# Patient Record
Sex: Female | Born: 1955 | Race: Black or African American | Hispanic: No | Marital: Married | Smoking: Never smoker
Health system: Southern US, Community
[De-identification: ages and names within clinical notes are randomized; demographics above are authoritative.]

## PROBLEM LIST (undated history)

## (undated) DIAGNOSIS — E785 Hyperlipidemia, unspecified: Secondary | ICD-10-CM

## (undated) DIAGNOSIS — E66811 Obesity, class 1: Secondary | ICD-10-CM

## (undated) DIAGNOSIS — G8929 Other chronic pain: Secondary | ICD-10-CM

## (undated) DIAGNOSIS — H8110 Benign paroxysmal vertigo, unspecified ear: Secondary | ICD-10-CM

## (undated) DIAGNOSIS — Z79899 Other long term (current) drug therapy: Secondary | ICD-10-CM

## (undated) DIAGNOSIS — E669 Obesity, unspecified: Secondary | ICD-10-CM

## (undated) DIAGNOSIS — L659 Nonscarring hair loss, unspecified: Secondary | ICD-10-CM

## (undated) DIAGNOSIS — M5416 Radiculopathy, lumbar region: Secondary | ICD-10-CM

## (undated) DIAGNOSIS — I8390 Asymptomatic varicose veins of unspecified lower extremity: Secondary | ICD-10-CM

## (undated) DIAGNOSIS — I10 Essential (primary) hypertension: Secondary | ICD-10-CM

## (undated) HISTORY — DX: Other chronic pain: G89.29

## (undated) HISTORY — DX: Nonscarring hair loss, unspecified: L65.9

## (undated) HISTORY — DX: Asymptomatic varicose veins of unspecified lower extremity: I83.90

## (undated) HISTORY — DX: Other long term (current) drug therapy: Z79.899

## (undated) HISTORY — DX: Obesity, unspecified: E66.9

## (undated) HISTORY — DX: Benign paroxysmal vertigo, unspecified ear: H81.10

## (undated) HISTORY — DX: Obesity, class 1: E66.811

## (undated) HISTORY — PX: BREAST EXCISIONAL BIOPSY: SUR124

## (undated) HISTORY — DX: Essential (primary) hypertension: I10

## (undated) HISTORY — PX: ABDOMINAL HYSTERECTOMY: SHX81

## (undated) HISTORY — DX: Hyperlipidemia, unspecified: E78.5

## (undated) HISTORY — PX: TONSILLECTOMY AND ADENOIDECTOMY: SUR1326

## (undated) HISTORY — PX: BREAST BIOPSY: SHX20

## (undated) HISTORY — DX: Radiculopathy, lumbar region: M54.16

---

## 2007-05-25 DIAGNOSIS — I10 Essential (primary) hypertension: Secondary | ICD-10-CM | POA: Insufficient documentation

## 2008-12-01 ENCOUNTER — Ambulatory Visit: Payer: Self-pay | Admitting: Family Medicine

## 2008-12-01 DIAGNOSIS — R600 Localized edema: Secondary | ICD-10-CM | POA: Insufficient documentation

## 2009-08-11 DIAGNOSIS — H8110 Benign paroxysmal vertigo, unspecified ear: Secondary | ICD-10-CM | POA: Insufficient documentation

## 2011-02-04 ENCOUNTER — Ambulatory Visit: Payer: Self-pay | Admitting: Family Medicine

## 2011-02-14 ENCOUNTER — Ambulatory Visit: Payer: Self-pay | Admitting: Family Medicine

## 2012-06-15 IMAGING — MG MAM DGTL SCREENING MAMMO W/CAD
1 series · 5 of 5 positions shown · non-contrast
Comparison: none

REASON FOR EXAM: SCR
COMMENTS:

[R CC · right · 5 of 5 slices shown]
[im 1/5]
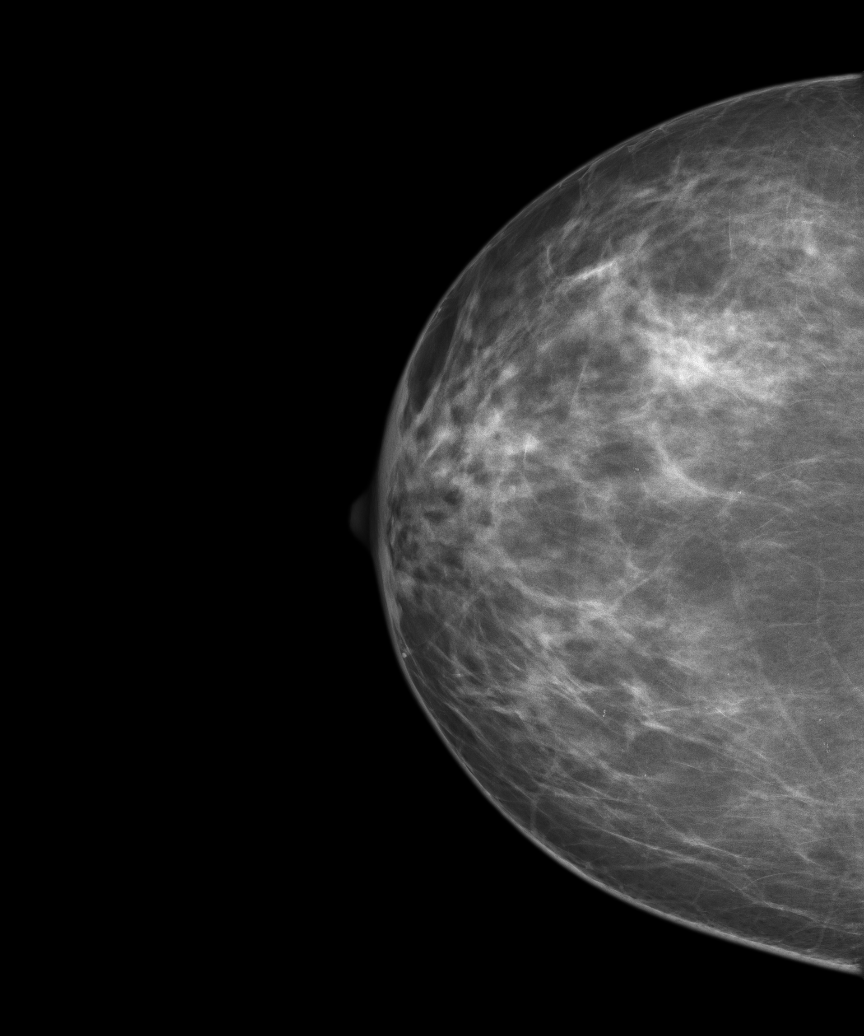
[im 2/5]
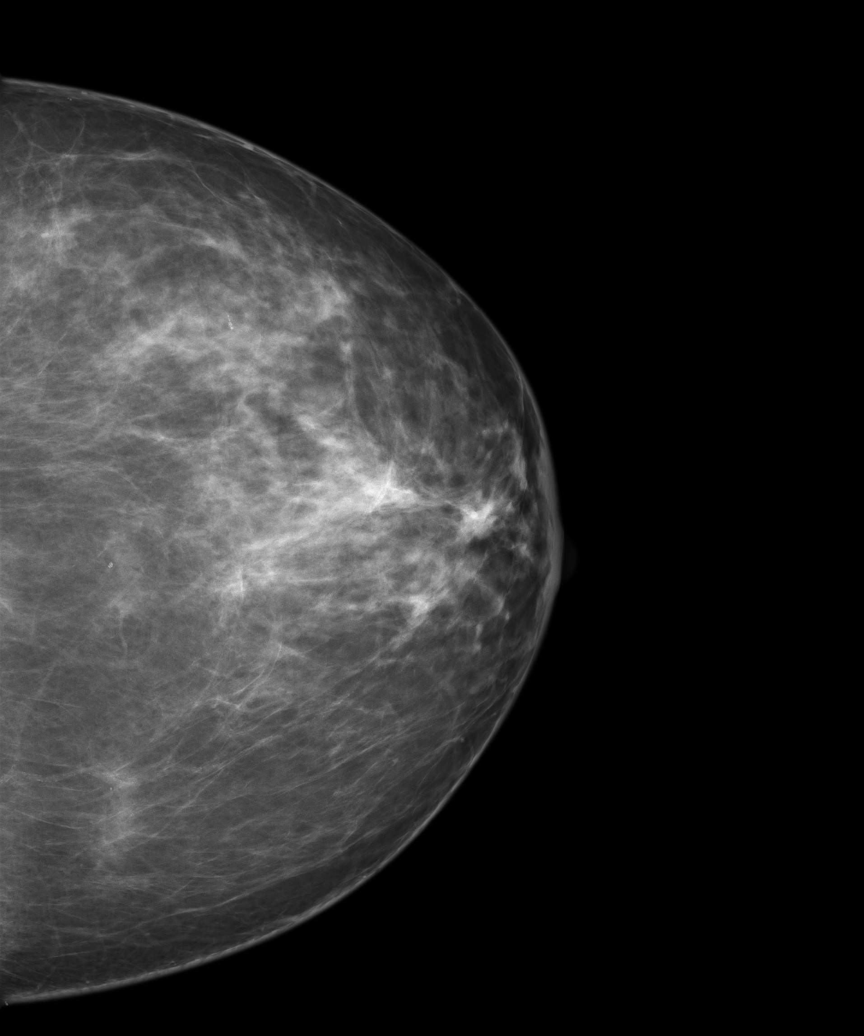
[im 3/5]
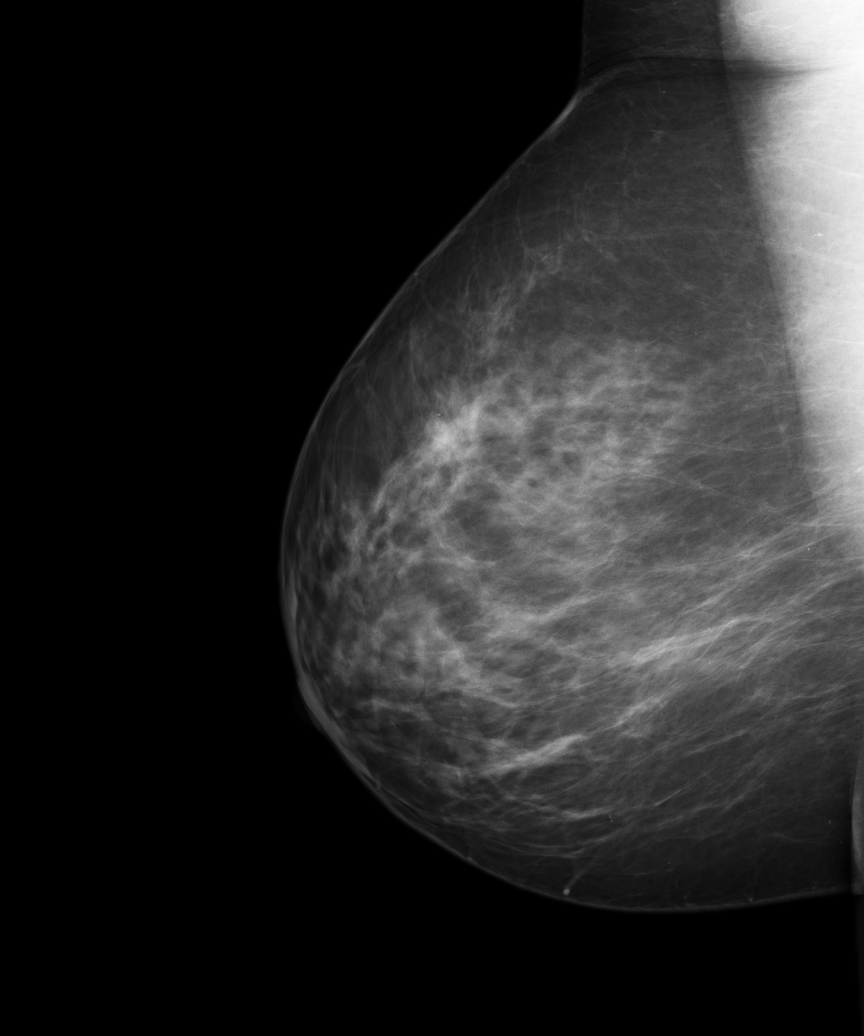
[im 4/5]
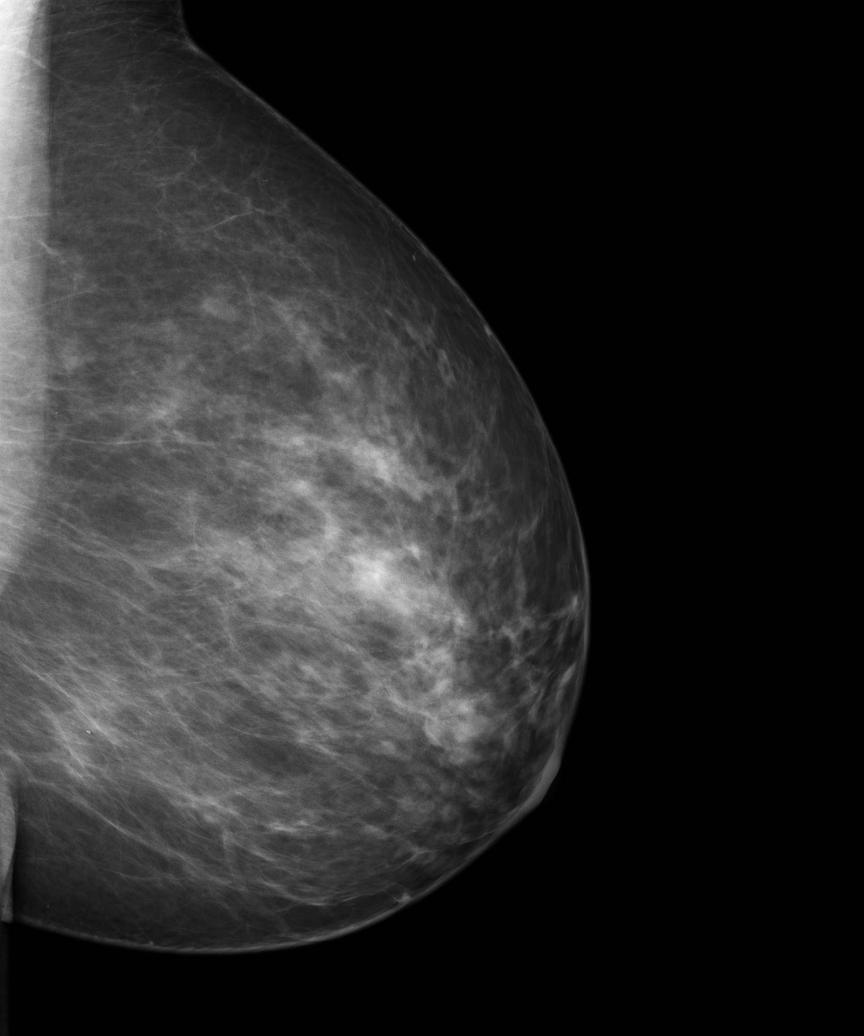
[im 5/5]
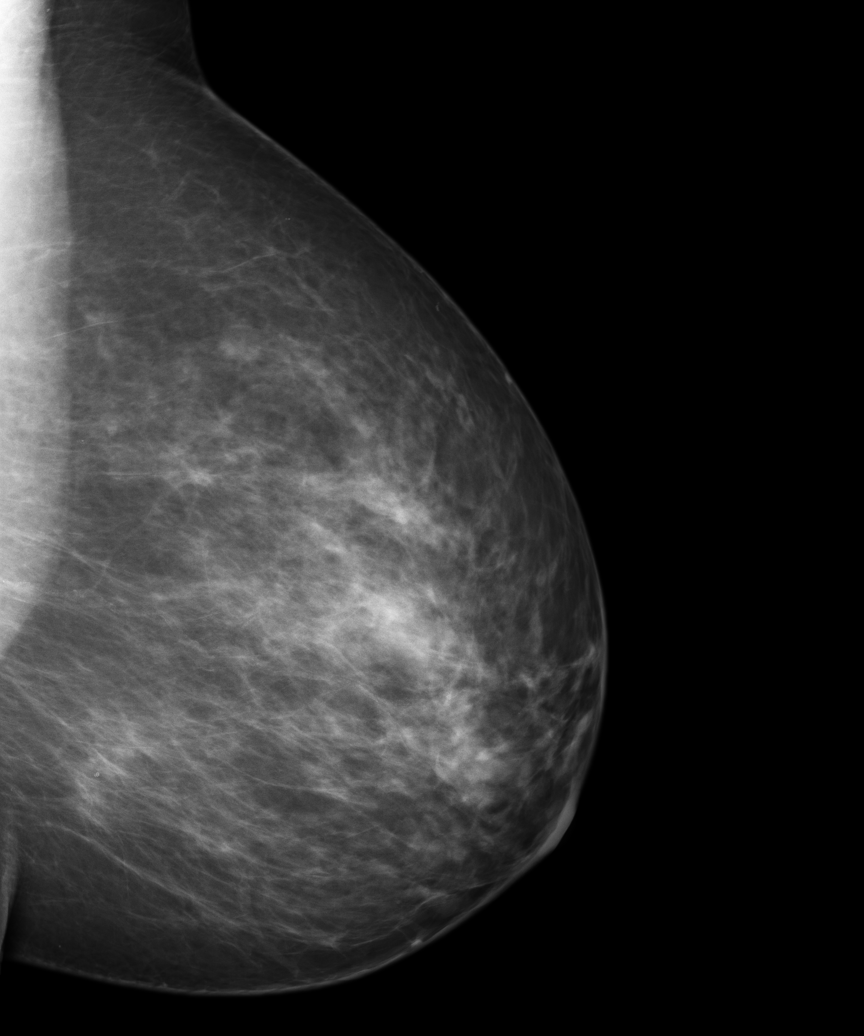

[5 of 5 positions shown; findings below may reference images not displayed]

PROCEDURE:     MAM - MAM DGTL SCREENING MAMMO W/CAD  - February 04, 2011 [DATE]

RESULT:     There are no prior studies available for comparison.

The breasts demonstrate a heterogeneous parenchymal pattern. An area of
asymmetric density with spiculation is appreciated within the superior
lateral portion of the right breast. There is no further radiographic
evidence to suggest malignancy.
IMPRESSION: Asymmetric density, right breast.

BI-RADS:  Category 0 - Needs Additional Imaging Evaluation

Thank you for this opportunity to contribute to the care of your patient.

A NEGATIVE MAMMOGRAM REPORT DOES NOT PRECLUDE BIOPSY OR OTHER EVALUATION OF
A CLINICALLY PALPABLE OR OTHERWISE SUSPICIOUS MASS OR LESION. BREAST CANCER
MAY NOT BE DETECTED BY MAMMOGRAPHY IN UP TO 10% OF CASES.

## 2012-07-24 ENCOUNTER — Ambulatory Visit: Payer: Self-pay | Admitting: Family Medicine

## 2013-11-04 ENCOUNTER — Ambulatory Visit: Payer: Self-pay | Admitting: Family Medicine

## 2015-03-04 ENCOUNTER — Ambulatory Visit
Admit: 2015-03-04 | Disposition: A | Discharge status: Home / Self Care | Payer: Self-pay | Attending: Family Medicine | Admitting: Family Medicine

## 2015-04-15 ENCOUNTER — Telehealth: Payer: Self-pay | Admitting: Family Medicine

## 2015-04-15 DIAGNOSIS — M25472 Effusion, left ankle: Secondary | ICD-10-CM

## 2015-04-15 DIAGNOSIS — M25572 Pain in left ankle and joints of left foot: Principal | ICD-10-CM

## 2015-04-15 MED ORDER — PREDNISONE 10 MG (21) PO TBPK: 10.0000 mg | ORAL_TABLET | Freq: Every day | ORAL | Status: DC

## 2015-04-15 NOTE — Telephone Encounter (Signed)
Pt is requesting a prescription for predisone having foot pain. Please send to [facility].

## 2015-04-15 NOTE — Telephone Encounter (Signed)
Done

## 2015-04-23 ENCOUNTER — Telehealth: Payer: Self-pay | Admitting: Family Medicine

## 2015-04-23 NOTE — Telephone Encounter (Signed)
No further refills of prednisone, not indented to be used chronically due to long term consequences. May use ibuprofen, naproxen instead.

## 2015-04-23 NOTE — Telephone Encounter (Signed)
Requesting one more refill on prednisone. States it normally takes two rounds of it to get rid of the foot problem. Please send to [facility]

## 2015-04-24 NOTE — Telephone Encounter (Signed)
Tried to contact patient to inform her of the message from Dr. Sherley Bounds, but there was not answer. A message was left for the patient encouraging her to use otc ibuprofen or naproxen since we would not be resubmitting a rx for prednisone. Patient was encouraged to give [place] a call on Monday if she had questions or if she wanted to come in for an office visit.

## 2015-05-14 ENCOUNTER — Telehealth: Payer: Self-pay | Admitting: Family Medicine

## 2015-05-14 NOTE — Telephone Encounter (Signed)
PT SAID THAT HER R ANKLE AND THE LFT ONE IS SWOLLEN A LOT. SHE KNOWS THAT THE DR HAS TOLD HER TO GET OFF HER LEGS WHEN OFF WORK BUT THE PILLS THAT SHE GAVE FOR PAIN HAS NOT DONE ANY GOOD. SHE HAS HAD TO PACK UP A HOUSE AND GET OUT OF IT AT A CERTAIN TIME AND SHE KNOWS THAT IS WHY THEY ARE A LITTLE SWOLLEN.

## 2015-05-18 NOTE — Telephone Encounter (Signed)
Office visit to address ongoing issues.

## 2015-05-20 NOTE — Telephone Encounter (Signed)
[facility]

## 2015-05-20 NOTE — Telephone Encounter (Signed)
Waiting for patient to return call and schedule appointment. Closing this message.

## 2015-05-24 ENCOUNTER — Other Ambulatory Visit: Payer: Self-pay | Admitting: Family Medicine

## 2015-05-29 ENCOUNTER — Ambulatory Visit (INDEPENDENT_AMBULATORY_CARE_PROVIDER_SITE_OTHER): Payer: BLUE CROSS/BLUE SHIELD | Admitting: Family Medicine

## 2015-05-29 ENCOUNTER — Encounter: Payer: Self-pay | Admitting: Family Medicine

## 2015-05-29 ENCOUNTER — Other Ambulatory Visit: Payer: Self-pay | Admitting: Family Medicine

## 2015-05-29 VITALS — BP 130/72 | HR 77 | Temp 98.3°F | Resp 16 | Ht 63.0 in | Wt 212.9 lb

## 2015-05-29 DIAGNOSIS — M25472 Effusion, left ankle: Secondary | ICD-10-CM | POA: Diagnosis not present

## 2015-05-29 DIAGNOSIS — R600 Localized edema: Secondary | ICD-10-CM | POA: Diagnosis not present

## 2015-05-29 DIAGNOSIS — K2100 Gastro-esophageal reflux disease with esophagitis, without bleeding: Secondary | ICD-10-CM

## 2015-05-29 DIAGNOSIS — Z5320 Procedure and treatment not carried out because of patient's decision for unspecified reasons: Secondary | ICD-10-CM | POA: Insufficient documentation

## 2015-05-29 DIAGNOSIS — F419 Anxiety disorder, unspecified: Secondary | ICD-10-CM | POA: Insufficient documentation

## 2015-05-29 DIAGNOSIS — I10 Essential (primary) hypertension: Secondary | ICD-10-CM

## 2015-05-29 DIAGNOSIS — M25572 Pain in left ankle and joints of left foot: Secondary | ICD-10-CM | POA: Diagnosis not present

## 2015-05-29 DIAGNOSIS — M7030 Other bursitis of elbow, unspecified elbow: Secondary | ICD-10-CM | POA: Insufficient documentation

## 2015-05-29 DIAGNOSIS — I8393 Asymptomatic varicose veins of bilateral lower extremities: Secondary | ICD-10-CM | POA: Diagnosis not present

## 2015-05-29 DIAGNOSIS — L659 Nonscarring hair loss, unspecified: Secondary | ICD-10-CM

## 2015-05-29 DIAGNOSIS — M25579 Pain in unspecified ankle and joints of unspecified foot: Secondary | ICD-10-CM | POA: Insufficient documentation

## 2015-05-29 DIAGNOSIS — E785 Hyperlipidemia, unspecified: Secondary | ICD-10-CM | POA: Insufficient documentation

## 2015-05-29 DIAGNOSIS — N959 Unspecified menopausal and perimenopausal disorder: Secondary | ICD-10-CM | POA: Insufficient documentation

## 2015-05-29 DIAGNOSIS — K649 Unspecified hemorrhoids: Secondary | ICD-10-CM | POA: Insufficient documentation

## 2015-05-29 DIAGNOSIS — G4700 Insomnia, unspecified: Secondary | ICD-10-CM | POA: Insufficient documentation

## 2015-05-29 HISTORY — DX: Nonscarring hair loss, unspecified: L65.9

## 2015-05-29 HISTORY — DX: Gastro-esophageal reflux disease with esophagitis, without bleeding: K21.00

## 2015-05-29 MED ORDER — DIAZEPAM 5 MG PO TABS: 2.5000 mg | ORAL_TABLET | Freq: Two times a day (BID) | ORAL | Status: DC | PRN

## 2015-05-29 MED ORDER — MELOXICAM 15 MG PO TABS: 15.0000 mg | ORAL_TABLET | Freq: Every day | ORAL | Status: DC

## 2015-05-29 MED ORDER — PREDNISONE 10 MG (21) PO TBPK: 10.0000 mg | ORAL_TABLET | Freq: Every day | ORAL | Status: DC

## 2015-05-29 MED ORDER — FUROSEMIDE 20 MG PO TABS: 20.0000 mg | ORAL_TABLET | Freq: Two times a day (BID) | ORAL | Status: DC

## 2015-05-29 NOTE — Patient Instructions (Signed)
Peripheral Edema °You have swelling in your legs (peripheral edema). This swelling is due to excess accumulation of salt and water in your body. Edema may be a sign of heart, kidney or liver disease, or a side effect of a medication. It may also be due to problems in the leg veins. Elevating your legs and using special support stockings may be very helpful, if the cause of the swelling is due to poor venous circulation. Avoid long periods of standing, whatever the cause. °Treatment of edema depends on identifying the cause. Chips, pretzels, pickles and other salty foods should be avoided. Restricting salt in your diet is almost always needed. Water pills (diuretics) are often used to remove the excess salt and water from your body via urine. These medicines prevent the kidney from reabsorbing sodium. This increases urine flow. °Diuretic treatment may also result in lowering of potassium levels in your body. Potassium supplements may be needed if you have to use diuretics daily. Daily weights can help you keep track of your progress in clearing your edema. You should call your caregiver for follow up care as recommended. °SEEK IMMEDIATE MEDICAL CARE IF:  °· You have increased swelling, pain, redness, or heat in your legs. °· You develop shortness of breath, especially when lying down. °· You develop chest or abdominal pain, weakness, or fainting. °· You have a fever. °Document Released: 12/01/2004 Document Revised: 01/16/2012 Document Reviewed: 11/11/2009 °ExitCare® Patient Information ©2015 [employer]. This information is not intended to replace advice given to you by your health care provider. Make sure you discuss any questions you have with your health care provider. ° °

## 2015-05-29 NOTE — Progress Notes (Signed)
Name: Vanessa Gross   MRN: [id number]    DOB: 1956/10/28   Date:05/29/2015       Progress Note  Subjective  Chief Complaint  Chief Complaint  Patient presents with  . Foot Swelling  . Medication Refill    prednisone is being requested    HPI  Hypertension: Patient here for follow-up of hypertention. She is not exercising and is adherent to low salt diet.  Blood pressure is well controlled at home. Cardiac symptoms lower extremity edema. Patient denies chest pain, chest pressure/discomfort, claudication, exertional chest pressure/discomfort, fatigue, irregular heart beat, near-syncope, palpitations, paroxysmal nocturnal dyspnea, syncope and tachypnea.  Cardiovascular risk factors: dyslipidemia, hypertension, obesity (BMI >= 30 kg/m2) and sedentary lifestyle. Use of agents associated with hypertension: NSAIDS. History of target organ damage: none.  Edema: Patient complains of edema. The location of the edema is lower leg(s) bilateral.  The edema has been mild and off and on.  Onset of symptoms was several years ago, stable since that time. The edema is present in the evening. The patient states the problem is long-standing.  The swelling has been aggravated by dependency of involved area and increased salt intake, relieved by diuretics, support stockings, elevation of involved area, low-salt diet, and been associated with venous insufficiency. Cardiac risk factors include dyslipidemia, hypertension, obesity (BMI >= 30 kg/m2) and sedentary lifestyle.  Anxiety: Patient complains of anxiety disorder.  She has the following symptoms: none. Onset of symptoms was approximately several years ago, stable since that time. She denies current suicidal and homicidal ideation. Family history significant for anxiety and depression.Possible organic causes contributing are: medications, endocrine/metabolic. Risk factors: previous episode of depression Previous treatment includes Valium and none.  She complains of  the following side effects from the treatment: none.     Patient Active Problem List   Diagnosis Date Noted  . Anxiety 05/29/2015  . Reflux esophagitis 05/29/2015  . Alopecia 05/29/2015  . Hemorrhoids 05/29/2015  . HLD (hyperlipidemia) 05/29/2015  . Cannot sleep 05/29/2015  . Menopausal and perimenopausal disorder 05/29/2015  . Bursitis of elbow 05/29/2015  . Ankle pain 05/29/2015  . Surgical or procedure not carried out because of patient's decision 05/29/2015  . Pain and swelling of left ankle 04/15/2015  . Nerve root pain 11/26/2009  . Benign paroxysmal positional nystagmus 08/11/2009  . Adiposity 01/28/2009  . Accumulation of fluid in tissues 12/01/2008  . BP (high blood pressure) 05/25/2007  . 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 05/25/2007    History  Substance Use Topics  . Smoking status: Never Smoker   . Smokeless tobacco: Not on file  . Alcohol Use: No     Current outpatient prescriptions:  .  amLODipine (NORVASC) 10 MG tablet, Take 1 tablet by mouth daily., Disp: , Rfl:  .  diazepam (VALIUM) 5 MG tablet, Take 2.5-5 mg by mouth 2 (two) times daily as needed., Disp: , Rfl:  .  furosemide (LASIX) 20 MG tablet, Take 30 mg by mouth daily., Disp: , Rfl:  .  predniSONE (STERAPRED UNI-PAK 21 TAB) 10 MG (21) TBPK tablet, Take 1 tablet (10 mg total) by mouth daily. Use as directed in taper pack. (Patient not taking: Reported on 05/29/2015), Disp: 21 tablet, Rfl: 0  Past Surgical History  Procedure Laterality Date  . Breast biopsy Left     benign  . Abdominal hysterectomy    . Tonsillectomy and adenoidectomy      Family History  Problem Relation Age of Onset  .  Hypertension Mother     Allergies  Allergen Reactions  . Celecoxib   . Gabapentin     abnormal dreams     Review of Systems  CONSTITUTIONAL: No significant weight changes, fever, chills, weakness or fatigue.  HEENT:  - Eyes: No visual changes.  - Ears: No auditory  changes. No pain.  - Nose: No sneezing, congestion, runny nose. - Throat: No sore throat. No changes in swallowing. SKIN: No rash or itching.  CARDIOVASCULAR: No chest pain, chest pressure or chest discomfort. No palpitations. Yes edema.  RESPIRATORY: No shortness of breath, cough or sputum.  GASTROINTESTINAL: No anorexia, nausea, vomiting. No changes in bowel habits. No abdominal pain or blood.  GENITOURINARY: No dysuria. No frequency. No discharge.  NEUROLOGICAL: No headache, dizziness, syncope, paralysis, ataxia, numbness or tingling in the extremities. No memory changes. No change in bowel or bladder control.  MUSCULOSKELETAL: No joint pain. No muscle pain. HEMATOLOGIC: No anemia, bleeding or bruising.  LYMPHATICS: No enlarged lymph nodes.  PSYCHIATRIC: No change in mood. No change in sleep pattern.  ENDOCRINOLOGIC: No reports of sweating, cold or heat intolerance. No polyuria or polydipsia.      Objective  BP 130/72 mmHg  Pulse 77  Temp(Src) 98.3 F (36.8 C) (Oral)  Resp 16  Ht 5\' 3"  (1.6 m)  Wt 212 lb 14.4 oz (96.571 kg)  BMI 37.72 kg/m2  SpO2 97% Body mass index is 37.72 kg/(m^2).  Physical Exam  Constitutional: Patient is obese and well-nourished. In no distress.  HEENT:  - Head: Normocephalic and atraumatic.  - Ears: Bilateral TMs gray, no erythema or effusion - Nose: Nasal mucosa moist - Mouth/Throat: Oropharynx is clear and moist. No tonsillar hypertrophy or erythema. No post nasal drainage.  - Eyes: Conjunctivae clear, EOM movements normal. PERRLA. No scleral icterus.  Neck: Normal range of motion. Neck supple. No JVD present. No thyromegaly present.  Cardiovascular: Normal rate, regular rhythm and normal heart sounds.  No murmur heard.  Pulmonary/Chest: Effort normal and breath sounds normal. No respiratory distress. Musculoskeletal: Normal range of motion bilateral UE and LE, positive for left ankle chronic joint effusion.  Peripheral vascular: Bilateral LE  trace edema.  Neurological: CN II-XII grossly intact with no focal deficits. Alert and oriented to person, place, and time. Coordination, balance, strength, speech and gait are normal.  Skin: Skin is warm and dry. No rash noted. No erythema.  Psychiatric: Patient has a normal mood and affect. Behavior is normal in office today. Judgment and thought content normal in office today.  Assessment & Plan  1. Essential hypertension Well controled, may use lasix 20mg  po bid (increased from her usual 30mg  a day dose) when LE swelling worsens.  - furosemide (LASIX) 20 MG tablet; Take 1 tablet (20 mg total) by mouth 2 (two) times daily.  Dispense: 60 tablet; Refill: 5  2. 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   3. Bilateral edema of lower extremity She continues to wear compression hosery, elevate legs at the end of day.  - furosemide (LASIX) 20 MG tablet; Take 1 tablet (20 mg total) by mouth 2 (two) times daily.  Dispense: 60 tablet; Refill: 5  4. Anxiety Requested refill of valium today.  - diazepam (VALIUM) 5 MG tablet; Take 0.5-1 tablets (2.5-5 mg total) by mouth 2 (two) times daily as needed.  Dispense: 30 tablet; Refill: 5  5. Ankle  pain, left Chronic stable issue.  - meloxicam (MOBIC) 15 MG tablet; Take 1 tablet (15 mg total) by mouth daily.  Dispense: 30 tablet; Refill: 5  6. Pain and swelling of left ankle Chronic stable issue.  - predniSONE (STERAPRED UNI-PAK 21 TAB) 10 MG (21) TBPK tablet; Take 1 tablet (10 mg total) by mouth daily. Use as directed in taper pack.  Dispense: 21 tablet; Refill: 1

## 2015-06-05 ENCOUNTER — Other Ambulatory Visit: Payer: Self-pay | Admitting: Family Medicine

## 2015-06-05 DIAGNOSIS — F419 Anxiety disorder, unspecified: Principal | ICD-10-CM

## 2015-06-05 DIAGNOSIS — F32A Depression, unspecified: Secondary | ICD-10-CM

## 2015-06-05 DIAGNOSIS — F329 Major depressive disorder, single episode, unspecified: Secondary | ICD-10-CM

## 2015-06-07 ENCOUNTER — Other Ambulatory Visit: Payer: Self-pay | Admitting: Family Medicine

## 2015-06-07 DIAGNOSIS — F419 Anxiety disorder, unspecified: Principal | ICD-10-CM

## 2015-06-07 DIAGNOSIS — F32A Depression, unspecified: Secondary | ICD-10-CM

## 2015-06-07 DIAGNOSIS — F329 Major depressive disorder, single episode, unspecified: Secondary | ICD-10-CM

## 2015-06-30 ENCOUNTER — Ambulatory Visit (INDEPENDENT_AMBULATORY_CARE_PROVIDER_SITE_OTHER): Payer: BLUE CROSS/BLUE SHIELD | Admitting: Family Medicine

## 2015-06-30 ENCOUNTER — Encounter: Payer: Self-pay | Admitting: Family Medicine

## 2015-06-30 VITALS — BP 128/88 | Temp 98.0°F | Ht 66.0 in | Wt 221.0 lb

## 2015-06-30 DIAGNOSIS — L600 Ingrowing nail: Secondary | ICD-10-CM

## 2015-06-30 DIAGNOSIS — IMO0001 Reserved for inherently not codable concepts without codable children: Secondary | ICD-10-CM | POA: Insufficient documentation

## 2015-06-30 DIAGNOSIS — R0602 Shortness of breath: Secondary | ICD-10-CM

## 2015-06-30 MED ORDER — DOXYCYCLINE HYCLATE 100 MG PO TABS: 100.0000 mg | ORAL_TABLET | Freq: Two times a day (BID) | ORAL | Status: DC

## 2015-06-30 NOTE — Progress Notes (Signed)
Name: [name]   MRN: [id number]    DOB: Oct 20, 1956   Date:06/30/2015       Progress Note  Subjective  Chief Complaint  Chief Complaint  Patient presents with  . Ingrown Toenail    patient questions if she has an infection. was told that she had to be seen by her pcp first.    HPI  Vanessa Gross is a pleasant 59 year old female who is hear today with toe nail discomfort. Onset several months now but recently more painful. No fevers, drainage, swelling. Has appointment [facility] podiatrist 06/19/15 but wanted to make sure she didn't need antibiotics.  Also she is complaining of feeling tired and short of breath with activity. She has no chest pain or palpitations or known history of asthma or wheezing. Does have venous valve insufficiency with on and off dependent edema. The location of the edema is lower leg(s) bilateral. The edema has been mild and off and on. Onset of symptoms was several years ago, stable since that time. The edema is present in the evening. The patient states the problem is long-standing. The swelling has been aggravated by dependency of involved area and increased salt intake, relieved by diuretics, support stockings, elevation of involved area, low-salt diet, and been associated with venous insufficiency. Cardiac risk factors include dyslipidemia, hypertension, obesity (BMI >= 30 kg/m2) and sedentary lifestyle.  Has consulted with vascular specialist previously with no significant findings other than venous valve insufficiency.   Patient Active Problem List   Diagnosis Date Noted  . Anxiety 05/29/2015  . Reflux esophagitis 05/29/2015  . Alopecia 05/29/2015  . Hemorrhoids 05/29/2015  . HLD (hyperlipidemia) 05/29/2015  . Cannot sleep 05/29/2015  . Menopausal and perimenopausal disorder 05/29/2015  . Bursitis of elbow 05/29/2015  . Ankle pain 05/29/2015  . Surgical or procedure not carried out because of patient's decision 05/29/2015  . Pain and  swelling of left ankle 04/15/2015  . Nerve root pain 11/26/2009  . Benign paroxysmal positional nystagmus 08/11/2009  . Adiposity 01/28/2009  . Bilateral edema of lower extremity 12/01/2008  . BP (high blood pressure) 05/25/2007  . 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 Smoking status: Never Smoker   . Smokeless tobacco: Not on file  . Alcohol Use: No     Current outpatient prescriptions:  .  amLODipine (NORVASC) 10 MG tablet, Take 1 tablet by mouth daily., Disp: , Rfl:  .  diazepam (VALIUM) 5 MG tablet, TAKE ONE-HALF TO ONE TABLET BY MOUTH EVERY 6 HOURS AS NEEDED FOR DIZZINESS OR NAUSEA, Disp: 30 tablet, Rfl: 5 .  furosemide (LASIX) 20 MG tablet, Take 1 tablet (20 mg total) by mouth 2 (two) times daily., Disp: 60 tablet, Rfl: 5 .  meloxicam (MOBIC) 15 MG tablet, Take 1 tablet (15 mg total) by mouth daily., Disp: 30 tablet, Rfl: 5 .  predniSONE (STERAPRED UNI-PAK 21 TAB) 10 MG (21) TBPK tablet, Take 1 tablet (10 mg total) by mouth daily. Use as directed in taper pack., Disp: 21 tablet, Rfl: 1  Past Surgical History  Procedure Laterality Date  . Breast biopsy Left     benign  . Abdominal hysterectomy    . Tonsillectomy and adenoidectomy      Family History  Problem Relation Age of Onset  . Hypertension Mother     Allergies  Allergen Reactions  . Celecoxib   . Gabapentin  abnormal dreams     Review of Systems  CONSTITUTIONAL: No significant weight changes, fever, chills, weakness or fatigue.  HEENT:  - Eyes: No visual changes.  - Ears: No auditory changes. No pain.  - Nose: No sneezing, congestion, runny nose. - Throat: No sore throat. No changes in swallowing. SKIN: No rash or itching.  CARDIOVASCULAR: No chest pain, chest pressure or chest discomfort. No palpitations or edema.  RESPIRATORY: Yes shortness of breath. No cough or sputum.  GASTROINTESTINAL: No anorexia, nausea,  vomiting. No changes in bowel habits. No abdominal pain or blood.  GENITOURINARY: No dysuria. No frequency. No discharge.  NEUROLOGICAL: No headache, dizziness, syncope, paralysis, ataxia, numbness or tingling in the extremities. No memory changes. No change in bowel or bladder control.  MUSCULOSKELETAL: Toe nail joint pain. No muscle pain. HEMATOLOGIC: No anemia, bleeding or bruising.  LYMPHATICS: No enlarged lymph nodes.  PSYCHIATRIC: No change in mood. No change in sleep pattern.  ENDOCRINOLOGIC: No reports of sweating, cold or heat intolerance. No polyuria or polydipsia.     Objective  BP 128/88 mmHg  Temp(Src) 98 F (36.7 C) (Oral)  Ht  (1.676 m)  Wt 221 lb (100.245 kg)  BMI 35.69 kg/m2 Body mass index is 35.69 kg/(m^2).  Physical Exam  Constitutional: Patient is obese and well-nourished. In no distress.  HEENT:  - Head: Normocephalic and atraumatic.  - Ears: Bilateral TMs gray, no erythema or effusion - Nose: Nasal mucosa moist - Mouth/Throat: Oropharynx is clear and moist. No tonsillar hypertrophy or erythema. No post nasal drainage.  - Eyes: Conjunctivae clear, EOM movements normal. PERRLA. No scleral icterus.  Neck: Normal range of motion. Neck supple. No JVD present. No thyromegaly present.  Cardiovascular: Normal rate, regular rhythm and normal heart sounds.  No murmur heard.  Pulmonary/Chest: Effort normal and breath sounds normal. No respiratory distress. Musculoskeletal: Normal range of motion bilateral UE and LE, no joint effusions. Peripheral vascular: Bilateral LE trace edema. First digit nails of both feet ingrown lateral aspect with mild tenderness in surround skin. Neurological: CN II-XII grossly intact with no focal deficits. Alert and oriented to person, place, and time. Coordination, balance, strength, speech and gait are normal.  Skin: Skin is warm and dry. No rash noted. No erythema.  Psychiatric: Patient has a normal mood and affect. Behavior is  normal in office today. Judgment and thought content normal in office today.   Assessment & Plan  1. Ingrown toenail without infection Home care with foot soaks, wear comfortable shoes and air dry feet as much as possible, may apply triple antibiotics at borders of nails. May use abx if symptoms worsen but avoid using if not needed.  - doxycycline (VIBRA-TABS) 100 MG tablet; Take 1 tablet (100 mg total) by mouth 2 (two) times daily.  Dispense: 20 tablet; Refill: 0  2. Shortness of breath dyspnea Normal spirometry. If symptoms worsen or progress will get CXR and consider Echocardiogram.  - PR BREATHING CAPACITY TEST; Standing - PR BREATHING CAPACITY TEST

## 2015-07-01 ENCOUNTER — Encounter: Payer: Self-pay | Admitting: Family Medicine

## 2015-08-07 ENCOUNTER — Other Ambulatory Visit: Payer: Self-pay | Admitting: Family Medicine

## 2015-08-07 DIAGNOSIS — I10 Essential (primary) hypertension: Secondary | ICD-10-CM

## 2015-08-07 DIAGNOSIS — R600 Localized edema: Secondary | ICD-10-CM

## 2015-08-07 MED ORDER — FUROSEMIDE 20 MG PO TABS: 20.0000 mg | ORAL_TABLET | Freq: Two times a day (BID) | ORAL | Status: DC

## 2015-08-07 MED ORDER — BISOPROLOL-HYDROCHLOROTHIAZIDE 5-6.25 MG PO TABS
1.0000 | ORAL_TABLET | Freq: Every day | ORAL | Status: DC
Start: 2015-08-07 — End: 2015-10-19

## 2015-08-07 NOTE — Telephone Encounter (Signed)
PT NEEDS REFILL ON BLOOP PRESSURE ( THE WHITE PILL THAT HELPS WITH HER SWELLING) SHE IS TAKING 2 A DAY AND THE QUANITY IS NOT ENOUGH TO LAST HER 30 DAYS. SHE ONLY HAS 1 LEFT. PHARM IS [facility] ON [address]

## 2015-08-07 NOTE — Telephone Encounter (Signed)
Refill request was sent to Dr. Ashany Sundaram for approval and submission.  

## 2015-09-08 ENCOUNTER — Telehealth: Payer: Self-pay | Admitting: Family Medicine

## 2015-09-08 DIAGNOSIS — I10 Essential (primary) hypertension: Secondary | ICD-10-CM

## 2015-09-08 DIAGNOSIS — R600 Localized edema: Secondary | ICD-10-CM

## 2015-09-08 NOTE — Telephone Encounter (Signed)
Patient has a question about the dosage of her blood pressure medication due to other foot swelling and patient only has on 2 pills left and is afraid that both feet will swell.

## 2015-09-08 NOTE — Telephone Encounter (Signed)
She is supposed to be on Bisoprolol-HCTZ 5-6.25 mg one a day and Lasix 20 mg twice a day. Does she need refills sent to local or mail order pharmacy?

## 2015-09-09 ENCOUNTER — Telehealth: Payer: Self-pay

## 2015-09-09 NOTE — Telephone Encounter (Signed)
Patient is requesting 60 pills of Ziac & Lasix so she can take it twice a day since taking one a day is not enough to keep down the swelling.   Patient wants it sent to her local pharmacy: [facility] [address]

## 2015-09-10 MED ORDER — BISOPROLOL-HYDROCHLOROTHIAZIDE 10-6.25 MG PO TABS: 1.0000 | ORAL_TABLET | Freq: Every day | ORAL | Status: DC

## 2015-09-10 MED ORDER — FUROSEMIDE 20 MG PO TABS: 20.0000 mg | ORAL_TABLET | Freq: Two times a day (BID) | ORAL | Status: DC

## 2015-09-10 NOTE — Telephone Encounter (Signed)
Ziac can not be prescribed as twice a day dosing, it is only one a day and so insurance will not cover 2 a day most likely, there fore I INCREASED ZIAC dose to 10-6.25 mg take ONE A DAY along with Furosemide 20 mg TWO TIMES A DAY. Both medications sent to [facility] at 90 day supplies with refills.   IF THERE IS ANY FURTHER QUESTIONS, CONFUSION, CONCERNS, HAVE HER FOLLOW UP WITH ME IN CLINIC.

## 2015-09-10 NOTE — Telephone Encounter (Signed)
ERRONEOUS ENTRY.

## 2015-09-14 ENCOUNTER — Telehealth: Payer: Self-pay | Admitting: Family Medicine

## 2015-09-14 NOTE — Telephone Encounter (Signed)
Errenous °

## 2015-09-14 NOTE — Telephone Encounter (Signed)
Lurena Joinerebecca, [facility], called to get clarity of the medication  (Ziac & Furosemide) that was recently sent in. I read Dr. Debby FreibergSundaram's message to her and encourage her to inform the patient that is she had any additional concerns to follow up in the clinic.

## 2015-10-19 ENCOUNTER — Telehealth: Payer: Self-pay

## 2015-10-19 ENCOUNTER — Encounter: Payer: Self-pay | Admitting: Family Medicine

## 2015-10-19 ENCOUNTER — Ambulatory Visit (INDEPENDENT_AMBULATORY_CARE_PROVIDER_SITE_OTHER): Payer: BLUE CROSS/BLUE SHIELD | Admitting: Family Medicine

## 2015-10-19 VITALS — BP 128/72 | Temp 97.7°F | Resp 18 | Wt 221.9 lb

## 2015-10-19 DIAGNOSIS — R059 Cough, unspecified: Secondary | ICD-10-CM

## 2015-10-19 DIAGNOSIS — R05 Cough: Secondary | ICD-10-CM | POA: Diagnosis not present

## 2015-10-19 DIAGNOSIS — H1013 Acute atopic conjunctivitis, bilateral: Secondary | ICD-10-CM | POA: Diagnosis not present

## 2015-10-19 DIAGNOSIS — J309 Allergic rhinitis, unspecified: Secondary | ICD-10-CM

## 2015-10-19 DIAGNOSIS — J0100 Acute maxillary sinusitis, unspecified: Secondary | ICD-10-CM | POA: Diagnosis not present

## 2015-10-19 DIAGNOSIS — H1010 Acute atopic conjunctivitis, unspecified eye: Secondary | ICD-10-CM | POA: Insufficient documentation

## 2015-10-19 MED ORDER — HYDROCOD POLST-CPM POLST ER 10-8 MG/5ML PO SUER: 5.0000 mL | Freq: Every evening | ORAL | Status: DC | PRN

## 2015-10-19 MED ORDER — AMOXICILLIN-POT CLAVULANATE 875-125 MG PO TABS: 1.0000 | ORAL_TABLET | Freq: Two times a day (BID) | ORAL | Status: DC

## 2015-10-19 NOTE — Patient Instructions (Signed)
ALAWAY Allergy Eye Drops found over the counter.

## 2015-10-19 NOTE — Progress Notes (Signed)
Name: Vanessa Gross   MRN: [id number]    DOB: Jul 20, 1956   Date:10/19/2015       Progress Note  Subjective  Chief Complaint  Chief Complaint  Patient presents with  . Sinusitis    started since Friday    HPI  Patient is here today with concerns regarding the following symptoms sore throat, congestion, sneezing, sinus pressure, productive cough, watery eyes, itchy eyes and achiness that started about 4 days ago.  Associated with fatigue and malaise. Denies discharge in eyes. Has tried the following home remedies: allergy medication and otc sinus pills. She has also tried tea with honey and lemon.   Past Medical History  Diagnosis Date  . Obesity, Class I, BMI 30.0-34.9 (see actual BMI)   . Hyperlipidemia LDL goal <100   . Benign paroxysmal positional vertigo     bilateral  . HTN, goal below 140/90   . 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   . Hair loss   . Chronic radicular lumbar pain     Social History  Substance Use Topics  . Smoking status: Never Smoker   . Smokeless tobacco: Not on file  . Alcohol Use: No     Current outpatient prescriptions:  .  AFLURIA PRESERVATIVE FREE 0.5 ML SUSY, , Disp: , Rfl: 0 .  bisoprolol-hydrochlorothiazide (ZIAC) 10-6.25 MG tablet, Take 1 tablet by mouth daily., Disp: 90 tablet, Rfl: 2 .  diazepam (VALIUM) 5 MG tablet, TAKE ONE-HALF TO ONE TABLET BY MOUTH EVERY 6 HOURS AS NEEDED FOR DIZZINESS OR NAUSEA, Disp: 30 tablet, Rfl: 5 .  doxycycline (VIBRA-TABS) 100 MG tablet, Take 1 tablet (100 mg total) by mouth 2 (two) times daily., Disp: 20 tablet, Rfl: 0 .  furosemide (LASIX) 20 MG tablet, Take 1 tablet (20 mg total) by mouth 2 (two) times daily., Disp: 180 tablet, Rfl: 2 .  meloxicam (MOBIC) 15 MG tablet, Take 1 tablet (15 mg total) by mouth daily., Disp: 30 tablet, Rfl: 5 .  Multiple Vitamin (MULTI-VITAMINS) TABS, Take by mouth., Disp: , Rfl:   Allergies  Allergen Reactions  . Celecoxib   . Gabapentin      abnormal dreams    ROS  Positive for fatigue, nasal congestion, sinus pressure, ear fullness, cough as mentioned in HPI, otherwise all systems reviewed and are negative.  Objective  Filed Vitals:   10/19/15 1528  BP: 128/72  Temp: 97.7 F (36.5 C)  TempSrc: Oral  Resp: 18  Weight: 221 lb 14.4 oz (100.653 kg)   Body mass index is 35.83 kg/(m^2).   Physical Exam  Constitutional: Patient appears well-developed and well-nourished. In no acute distress but does appear to be fatigued from acute illness. HEENT:  - Head: Normocephalic and atraumatic. Tenderness bilateral maxillary sinuses.  - Ears: RIGHT TM bulging with minimal clear exudate, LEFT TM bulging with minimal clear exudate.  - Nose: Nasal mucosa boggy and congested.  - Mouth/Throat: Oropharynx is moist with slight erythema of bilateral tonsils without hypertrophy or exudates. Post nasal drainage present.  - Eyes: Conjunctivae clear, EOM movements normal. PERRLA. No scleral icterus. Some puffy upper eyelids bilaterally.  Neck: Normal range of motion. Neck supple. No JVD present. No thyromegaly present. No local lymphadenopathy. Cardiovascular: Regular rate, regular rhythm with no murmurs heard.  Pulmonary/Chest: Effort normal and breath sounds clear in all lung fields.  Musculoskeletal: Normal range of motion bilateral UE and LE, no joint effusions. Skin: Skin is warm and dry. No rash noted. Psychiatric: Patient  has a normal mood and affect. Behavior is normal in office today. Judgment and thought content normal in office today.   Assessment & Plan  1. Acute maxillary sinusitis, recurrence not specified Etiologies include initial allergic rhinitis or viral infection progressing to superimposed bacterial infection. Instructed patient on increasing hydration, nasal saline spray, steam inhalation, NSAID if tolerated and not contraindicated. If not already doing so start taking daily anti-histamine and use a steroid nasal  spray. If symptoms persist/worsen may consider antibiotic therapy.  - amoxicillin-clavulanate (AUGMENTIN) 875-125 MG tablet; Take 1 tablet by mouth 2 (two) times daily.  Dispense: 20 tablet; Refill: 0 - chlorpheniramine-HYDROcodone (TUSSIONEX PENNKINETIC ER) 10-8 MG/5ML SUER; Take 5 mLs by mouth at bedtime as needed for cough.  Dispense: 115 mL; Refill: 0  2. Allergic conjunctivitis and rhinitis, bilateral [place] eye drops OTC.  - amoxicillin-clavulanate (AUGMENTIN) 875-125 MG tablet; Take 1 tablet by mouth 2 (two) times daily.  Dispense: 20 tablet; Refill: 0 - chlorpheniramine-HYDROcodone (TUSSIONEX PENNKINETIC ER) 10-8 MG/5ML SUER; Take 5 mLs by mouth at bedtime as needed for cough.  Dispense: 115 mL; Refill: 0  3. Cough Cough syrup provided.  - amoxicillin-clavulanate (AUGMENTIN) 875-125 MG tablet; Take 1 tablet by mouth 2 (two) times daily.  Dispense: 20 tablet; Refill: 0 - chlorpheniramine-HYDROcodone (TUSSIONEX PENNKINETIC ER) 10-8 MG/5ML SUER; Take 5 mLs by mouth at bedtime as needed for cough.  Dispense: 115 mL; Refill: 0

## 2015-10-19 NOTE — Telephone Encounter (Signed)
appt was made for today at 3:15pm

## 2015-11-28 ENCOUNTER — Other Ambulatory Visit: Payer: Self-pay | Admitting: Family Medicine

## 2015-11-29 ENCOUNTER — Other Ambulatory Visit: Payer: Self-pay | Admitting: Family Medicine

## 2015-11-30 ENCOUNTER — Other Ambulatory Visit: Payer: Self-pay

## 2015-11-30 MED ORDER — MELOXICAM 15 MG PO TABS: 15.0000 mg | ORAL_TABLET | Freq: Every day | ORAL | Status: DC

## 2016-06-06 ENCOUNTER — Other Ambulatory Visit: Payer: Self-pay

## 2016-06-07 ENCOUNTER — Encounter: Payer: Self-pay | Admitting: Family Medicine

## 2016-06-07 ENCOUNTER — Ambulatory Visit (INDEPENDENT_AMBULATORY_CARE_PROVIDER_SITE_OTHER): Payer: BLUE CROSS/BLUE SHIELD | Admitting: Family Medicine

## 2016-06-07 VITALS — BP 122/76 | HR 75 | Temp 98.4°F | Resp 14 | Wt 221.0 lb

## 2016-06-07 DIAGNOSIS — I10 Essential (primary) hypertension: Secondary | ICD-10-CM | POA: Diagnosis not present

## 2016-06-07 DIAGNOSIS — G4700 Insomnia, unspecified: Secondary | ICD-10-CM

## 2016-06-07 DIAGNOSIS — R600 Localized edema: Secondary | ICD-10-CM

## 2016-06-07 DIAGNOSIS — R0602 Shortness of breath: Secondary | ICD-10-CM | POA: Diagnosis not present

## 2016-06-07 DIAGNOSIS — E785 Hyperlipidemia, unspecified: Secondary | ICD-10-CM

## 2016-06-07 DIAGNOSIS — Z5181 Encounter for therapeutic drug level monitoring: Secondary | ICD-10-CM | POA: Diagnosis not present

## 2016-06-07 DIAGNOSIS — E669 Obesity, unspecified: Secondary | ICD-10-CM

## 2016-06-07 DIAGNOSIS — IMO0001 Reserved for inherently not codable concepts without codable children: Secondary | ICD-10-CM

## 2016-06-07 NOTE — Assessment & Plan Note (Signed)
Patient will return for spirometry on Friday; if peak is abnormal, we'll do a post too; check CBC

## 2016-06-07 NOTE — Assessment & Plan Note (Signed)
Check labs on ACE-I

## 2016-06-07 NOTE — Assessment & Plan Note (Signed)
See AVS

## 2016-06-07 NOTE — Telephone Encounter (Signed)
[facility] website reviewed; she just got tramadol from another prescriber last week Rx for benzo DENIED

## 2016-06-07 NOTE — Assessment & Plan Note (Signed)
Suggested melatonin 3 mg for 3 weeks at the exact same time of night as a reset; see AVS

## 2016-06-07 NOTE — Assessment & Plan Note (Signed)
Encouraged her to NOT use furosemide (removed from med list) for occasional puffiness in her feet after prolonged standing; leg elevation instead

## 2016-06-07 NOTE — Patient Instructions (Addendum)
Please return on Friday for breathing tests and fasting labs  Try to limit saturated fats in your diet (bologna, hot dogs, barbeque, cheeseburgers, hamburgers, steak, bacon, sausage, cheese, etc.) and get more fresh fruits, vegetables, and whole grains  Your goal blood pressure is less than 140 mmHg on top. Try to follow the DASH guidelines (DASH stands for Dietary Approaches to Stop Hypertension) Try to limit the sodium in your diet.  Ideally, consume less than 1.5 grams (less than 1,500mg ) per day. Do not add salt when cooking or at the table.  Check the sodium amount on labels when shopping, and choose items lower in sodium when given a choice. Avoid or limit foods that already contain a lot of sodium. Eat a diet rich in fruits and vegetables and whole grains.  Check out the information at familydoctor.org entitled "Nutrition for Weight Loss: What You Need to Know about Fad Diets" Try to lose between 1-2 pounds per week by taking in fewer calories and burning off more calories You can succeed by limiting portions, limiting foods dense in calories and fat, becoming more active, and drinking 8 glasses of water a day (64 ounces) Don't skip meals, especially breakfast, as skipping meals may alter your metabolism Do not use over-the-counter weight loss pills or gimmicks that claim rapid weight loss A healthy BMI (or body mass index) is between 18.5 and 24.9 You can calculate your ideal BMI at the NIH website [employer].hu  Try melatonin 3 mg at the exact same time of night before bed for 3 weeks  DASH Eating Plan DASH stands for "Dietary Approaches to Stop Hypertension." The DASH eating plan is a healthy eating plan that has been shown to reduce high blood pressure (hypertension). Additional health benefits may include reducing the risk of type 2 diabetes mellitus, heart disease, and stroke. The DASH eating plan may also help with weight  loss. WHAT DO I NEED TO KNOW ABOUT THE DASH EATING PLAN? For the DASH eating plan, you will follow these general guidelines:  Choose foods with a percent daily value for sodium of less than 5% (as listed on the food label).  Use salt-free seasonings or herbs instead of table salt or sea salt.  Check with your health care provider or pharmacist before using salt substitutes.  Eat lower-sodium products, often labeled as "lower sodium" or "no salt added."  Eat fresh foods.  Eat more vegetables, fruits, and low-fat dairy products.  Choose whole grains. Look for the word "whole" as the first word in the ingredient list.  Choose fish and skinless chicken or [place] more often than red meat. Limit fish, poultry, and meat to 6 oz (170 g) each day.  Limit sweets, desserts, sugars, and sugary drinks.  Choose heart-healthy fats.  Limit cheese to 1 oz (28 g) per day.  Eat more home-cooked food and less restaurant, buffet, and fast food.  Limit fried foods.  Cook foods using methods other than frying.  Limit canned vegetables. If you do use them, rinse them well to decrease the sodium.  When eating at a restaurant, ask that your food be prepared with less salt, or no salt if possible. WHAT FOODS CAN I EAT? Seek help from a dietitian for individual calorie needs. Grains Whole grain or whole wheat bread. Brown rice. Whole grain or whole wheat pasta. Quinoa, bulgur, and whole grain cereals. Low-sodium cereals. Corn or whole wheat flour tortillas. Whole grain cornbread. Whole grain crackers. Low-sodium crackers. Vegetables Fresh or frozen vegetables (raw,  steamed, roasted, or grilled). Low-sodium or reduced-sodium tomato and vegetable juices. Low-sodium or reduced-sodium tomato sauce and paste. Low-sodium or reduced-sodium canned vegetables.  Fruits All fresh, canned (in natural juice), or frozen fruits. Meat and Other Protein Products Ground beef (85% or leaner), grass-fed beef, or beef  trimmed of fat. Skinless chicken or [place]. Ground chicken or [place]. Pork trimmed of fat. All fish and seafood. Eggs. Dried beans, peas, or lentils. Unsalted nuts and seeds. Unsalted canned beans. Dairy Low-fat dairy products, such as skim or 1% milk, 2% or reduced-fat cheeses, low-fat ricotta or cottage cheese, or plain low-fat yogurt. Low-sodium or reduced-sodium cheeses. Fats and Oils Tub margarines without trans fats. Light or reduced-fat mayonnaise and salad dressings (reduced sodium). Avocado. Safflower, olive, or canola oils. Natural peanut or almond butter. Other Unsalted popcorn and pretzels. The items listed above may not be a complete list of recommended foods or beverages. Contact your dietitian for more options. WHAT FOODS ARE NOT RECOMMENDED? Grains White bread. White pasta. White rice. Refined cornbread. Bagels and croissants. Crackers that contain trans fat. Vegetables Creamed or fried vegetables. Vegetables in a cheese sauce. Regular canned vegetables. Regular canned tomato sauce and paste. Regular tomato and vegetable juices. Fruits Dried fruits. Canned fruit in light or heavy syrup. Fruit juice. Meat and Other Protein Products Fatty cuts of meat. Ribs, chicken wings, bacon, sausage, bologna, salami, chitterlings, fatback, hot dogs, bratwurst, and packaged luncheon meats. Salted nuts and seeds. Canned beans with salt. Dairy Whole or 2% milk, cream, half-and-half, and cream cheese. Whole-fat or sweetened yogurt. Full-fat cheeses or blue cheese. Nondairy creamers and whipped toppings. Processed cheese, cheese spreads, or cheese curds. Condiments Onion and garlic salt, seasoned salt, table salt, and sea salt. Canned and packaged gravies. Worcestershire sauce. Tartar sauce. Barbecue sauce. Teriyaki sauce. Soy sauce, including reduced sodium. Steak sauce. Fish sauce. Oyster sauce. Cocktail sauce. Horseradish. Ketchup and mustard. Meat flavorings and tenderizers. Bouillon cubes. Hot  sauce. Tabasco sauce. Marinades. Taco seasonings. Relishes. Fats and Oils Butter, stick margarine, lard, shortening, ghee, and bacon fat. Coconut, palm kernel, or palm oils. Regular salad dressings. Other Pickles and olives. Salted popcorn and pretzels. The items listed above may not be a complete list of foods and beverages to avoid. Contact your dietitian for more information. WHERE CAN I FIND MORE INFORMATION? National Heart, Lung, and Blood Institute: CablePromo.it   This information is not intended to replace advice given to you by your health care provider. Make sure you discuss any questions you have with your health care provider.   Document Released: 10/13/2011 Document Revised: 11/14/2014 Document Reviewed: 08/28/2013 Elsevier Interactive Patient Education 2016 Elsevier Inc. Insomnia Insomnia is a sleep disorder that makes it difficult to fall asleep or to stay asleep. Insomnia can cause tiredness (fatigue), low energy, difficulty concentrating, mood swings, and poor performance at work or school.  There are three different ways to classify insomnia:  Difficulty falling asleep.  Difficulty staying asleep.  Waking up too early in the morning. Any type of insomnia can be long-term (chronic) or short-term (acute). Both are common. Short-term insomnia usually lasts for three months or less. Chronic insomnia occurs at least three times a week for longer than three months. CAUSES  Insomnia may be caused by another condition, situation, or substance, such as:  Anxiety.  Certain medicines.  Gastroesophageal reflux disease (GERD) or other gastrointestinal conditions.  Asthma or other breathing conditions.  Restless legs syndrome, sleep apnea, or other sleep disorders.  Chronic pain.  Menopause. This  may include hot flashes.  Stroke.  Abuse of alcohol, tobacco, or illegal drugs.  Depression.  Caffeine.   Neurological disorders,  such as Alzheimer disease.  An overactive thyroid (hyperthyroidism). The cause of insomnia may not be known. RISK FACTORS Risk factors for insomnia include:  Gender. Women are more commonly affected than men.  Age. Insomnia is more common as you get older.  Stress. This may involve your professional or personal life.  Income. Insomnia is more common in people with lower income.  Lack of exercise.   Irregular work schedule or night shifts.  Traveling between different time zones. SIGNS AND SYMPTOMS If you have insomnia, trouble falling asleep or trouble staying asleep is the main symptom. This may lead to other symptoms, such as:  Feeling fatigued.  Feeling nervous about going to sleep.  Not feeling rested in the morning.  Having trouble concentrating.  Feeling irritable, anxious, or depressed. TREATMENT  Treatment for insomnia depends on the cause. If your insomnia is caused by an underlying condition, treatment will focus on addressing the condition. Treatment may also include:   Medicines to help you sleep.  Counseling or therapy.  Lifestyle adjustments. HOME CARE INSTRUCTIONS   Take medicines only as directed by your health care provider.  Keep regular sleeping and waking hours. Avoid naps.  Keep a sleep diary to help you and your health care provider figure out what could be causing your insomnia. Include:   When you sleep.  When you wake up during the night.  How well you sleep.   How rested you feel the next day.  Any side effects of medicines you are taking.  What you eat and drink.   Make your bedroom a comfortable place where it is easy to fall asleep:  Put up shades or special blackout curtains to block light from outside.  Use a white noise machine to block noise.  Keep the temperature cool.   Exercise regularly as directed by your health care provider. Avoid exercising right before bedtime.  Use relaxation techniques to manage  stress. Ask your health care provider to suggest some techniques that may work well for you. These may include:  Breathing exercises.  Routines to release muscle tension.  Visualizing peaceful scenes.  Cut back on alcohol, caffeinated beverages, and cigarettes, especially close to bedtime. These can disrupt your sleep.  Do not overeat or eat spicy foods right before bedtime. This can lead to digestive discomfort that can make it hard for you to sleep.  Limit screen use before bedtime. This includes:  Watching TV.  Using your smartphone, tablet, and computer.  Stick to a routine. This can help you fall asleep faster. Try to do a quiet activity, brush your teeth, and go to bed at the same time each night.  Get out of bed if you are still awake after 15 minutes of trying to sleep. Keep the lights down, but try reading or doing a quiet activity. When you feel sleepy, go back to bed.  Make sure that you drive carefully. Avoid driving if you feel very sleepy.  Keep all follow-up appointments as directed by your health care provider. This is important. SEEK MEDICAL CARE IF:   You are tired throughout the day or have trouble in your daily routine due to sleepiness.  You continue to have sleep problems or your sleep problems get worse. SEEK IMMEDIATE MEDICAL CARE IF:   You have serious thoughts about hurting yourself or someone else.   This  information is not intended to replace advice given to you by your health care provider. Make sure you discuss any questions you have with your health care provider.   Document Released: 10/21/2000 Document Revised: 07/15/2015 Document Reviewed: 07/25/2014 Elsevier Interactive Patient Education [employer]

## 2016-06-07 NOTE — Assessment & Plan Note (Signed)
Check lipids on Friday; limit saturated fats

## 2016-06-07 NOTE — Progress Notes (Signed)
BP 122/76   Pulse 75   Temp 98.4 F (36.9 C) (Oral)   Resp 14   Wt 221 lb (100.2 kg)   SpO2 96%   BMI 35.67 kg/m    Subjective:    Patient ID: Vanessa Gross, female    DOB: Nov 24, 1955, 60 y.o.   MRN: [id number]  HPI: Vanessa Gross is a 60 y.o. female  Chief Complaint  Patient presents with  . Medication Refill   She has trigger finger on her right thumb; she is right-handed; saw the orthopaedist on July 26th and he clipped it; feeling some better; doctor changed the bandaged, but the swelling is still there; no fever; no red streaks going up the arm; no pus; she goes back on Friday to take the stitches out  HTN; has had it for many years; on combo medicines; she is not familiar with DASH guidelines; no added salt; she does eat a fair amount of processed foods and fatty meats  Hyperlipidemia; not fasting today; she'll be here Friday and will come fasting; does like fried and fatty foods; 2 eggs a week; not much cheese; she does walk up and down floor at work; not a milk drinker  Vertigo; one morning she got up out of the bed and things were spinning; she takes diazepam; just happened once; made her sick on her stomach  Shortness of breath now and then; Dr. Kathie Rhodes was going to get tests done but patient can't afford it; symptoms have been going on for more than a year; comes every now and then; can be with a little cough; gets choked on her own fluids; can't afford any more testing because she's paying for her thumb surgery  She does have some trouble sleeping  Depression screen [facility] 2/9 06/07/2016 10/19/2015 05/29/2015  Decreased Interest 0 0 0  Down, Depressed, Hopeless 0 0 0  PHQ - 2 Score 0 0 0   Relevant past medical, surgical, family and social history reviewed Past Medical History:  Diagnosis Date  . Benign paroxysmal positional vertigo    bilateral  . Chronic radicular lumbar pain   . Hair loss   . HTN, goal below 140/90   . Hyperlipidemia LDL goal <100   . Obesity,  Class I, BMI 30.0-34.9 (see actual BMI)   . 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 ABDOMINAL HYSTERECTOMY    . BREAST BIOPSY Left    benign  . TONSILLECTOMY AND ADENOIDECTOMY     Family History  Problem Relation Age of Onset  . Hypertension Mother   grandfather had a heart attack  Social History  Substance Use Topics  . Smoking status: Never Smoker  . Smokeless tobacco: Not on file  . Alcohol use No   Interim medical history since last visit reviewed. Allergies and medications reviewed  Review of Systems Per HPI unless specifically indicated above Fatigue; no constipation, regular bowels; no chest pain     Objective:    BP 122/76   Pulse 75   Temp 98.4 F (36.9 C) (Oral)   Resp 14   Wt 221 lb (100.2 kg)   SpO2 96%   BMI 35.67 kg/m   Wt Readings from Last 3 Encounters:  06/07/16 221 lb (100.2 kg)  10/19/15 221 lb 14.4 oz (100.7 kg)  06/30/15 221 lb (100.2 kg)    Physical Exam  Constitutional: She appears well-developed and well-nourished. No distress.  HENT:  Head: Normocephalic and atraumatic.  Eyes: EOM are normal. No scleral icterus.  Neck: No thyromegaly present.  Cardiovascular: Normal rate, regular rhythm and normal heart sounds.   No murmur heard. Pulmonary/Chest: Effort normal and breath sounds normal. No respiratory distress. She has no wheezes.  Abdominal: Soft. Bowel sounds are normal. She exhibits no distension.  Musculoskeletal: Normal range of motion. She exhibits edema (no pitting pretibial edema; there is trace pedal edema).  Neurological: She is alert. She exhibits normal muscle tone.  Skin: Skin is warm and dry. She is not diaphoretic. No pallor.  Psychiatric: She has a normal mood and affect. Her behavior is normal. Judgment and thought content normal.   No results found for this or any previous visit.    Assessment & Plan:   Problem List Items Addressed  This Visit      Cardiovascular and Mediastinum   Essential hypertension, benign - Primary    Try the DASH guidelines; no added salt; continue combo pill        Other   Shortness of breath dyspnea    Patient will return for spirometry on Friday; if peak is abnormal, we'll do a post too; check CBC      Relevant Orders   CBC with Differential/Platelet   Obesity    See AVS      Medication monitoring encounter    Check labs on ACE-I      Relevant Orders   Comprehensive Metabolic Panel (CMET)   Insomnia    Suggested melatonin 3 mg for 3 weeks at the exact same time of night as a reset; see AVS      HLD (hyperlipidemia)    Check lipids on Friday; limit saturated fats      Relevant Orders   Lipid panel   Bilateral edema of lower extremity    Encouraged her to NOT use furosemide (removed from med list) for occasional puffiness in her feet after prolonged standing; leg elevation instead       Other Visit Diagnoses   None.      Follow up plan: No Follow-up on file. --> depends on spirometry  An after-visit summary was printed and given to the patient at check-out.  Please see the patient instructions which may contain other information and recommendations beyond what is mentioned above in the assessment and plan.  No orders of the defined types were placed in this encounter.  Orders Placed This Encounter  Procedures  . Comprehensive Metabolic Panel (CMET)  . CBC with Differential/Platelet  . Lipid panel

## 2016-06-07 NOTE — Assessment & Plan Note (Signed)
Try the DASH guidelines; no added salt; continue combo pill

## 2016-06-15 ENCOUNTER — Telehealth: Payer: Self-pay | Admitting: Family Medicine

## 2016-06-15 ENCOUNTER — Other Ambulatory Visit: Payer: Self-pay

## 2016-06-15 DIAGNOSIS — R600 Localized edema: Secondary | ICD-10-CM

## 2016-06-15 DIAGNOSIS — I10 Essential (primary) hypertension: Secondary | ICD-10-CM

## 2016-06-15 MED ORDER — BISOPROLOL-HYDROCHLOROTHIAZIDE 10-6.25 MG PO TABS: 1.0000 | ORAL_TABLET | Freq: Every day | ORAL | 0 refills | Status: DC

## 2016-06-15 NOTE — Telephone Encounter (Signed)
I'm not going to refill diazepam; that is a controlled substance, a drug that can lead to potential addiction, so it was removed from her med list at her appointment Please remind her we want to see her labs to continue the BP med; I just approved a 30 day supply, and as soon as I verify that her kidneys are okay, I can refill 90 day supplies

## 2016-06-15 NOTE — Telephone Encounter (Signed)
I stopped the diazepam; it's controlled substance, not planning on keeping her on that; I sent 30 days of BP med, but she needs labs for further meds

## 2016-06-15 NOTE — Telephone Encounter (Signed)
Left voice mail

## 2016-06-15 NOTE — Telephone Encounter (Signed)
Pt states yall discussed both these meds at her appt.  bp and the diazepam which is discontinued from her med list?

## 2016-06-17 ENCOUNTER — Ambulatory Visit (INDEPENDENT_AMBULATORY_CARE_PROVIDER_SITE_OTHER): Payer: BLUE CROSS/BLUE SHIELD

## 2016-06-17 ENCOUNTER — Other Ambulatory Visit: Payer: Self-pay | Admitting: Family Medicine

## 2016-06-17 ENCOUNTER — Encounter: Payer: Self-pay | Admitting: Family Medicine

## 2016-06-17 DIAGNOSIS — R7401 Elevation of levels of liver transaminase levels: Secondary | ICD-10-CM

## 2016-06-17 DIAGNOSIS — D508 Other iron deficiency anemias: Secondary | ICD-10-CM

## 2016-06-17 DIAGNOSIS — R740 Nonspecific elevation of levels of transaminase and lactic acid dehydrogenase [LDH]: Secondary | ICD-10-CM

## 2016-06-17 DIAGNOSIS — E785 Hyperlipidemia, unspecified: Secondary | ICD-10-CM

## 2016-06-17 DIAGNOSIS — Z5181 Encounter for therapeutic drug level monitoring: Secondary | ICD-10-CM | POA: Diagnosis not present

## 2016-06-17 DIAGNOSIS — IMO0001 Reserved for inherently not codable concepts without codable children: Secondary | ICD-10-CM

## 2016-06-17 DIAGNOSIS — R718 Other abnormality of red blood cells: Secondary | ICD-10-CM

## 2016-06-17 LAB — CBC WITH DIFFERENTIAL/PLATELET
BASOS ABS: 43 {cells}/uL (ref 0–200)
Basophils Relative: 1 %
Eosinophils Absolute: 86 cells/uL (ref 15–500)
Eosinophils Relative: 2 %
HEMATOCRIT: 42.4 % (ref 35.0–45.0)
HEMOGLOBIN: 13.2 g/dL (ref 11.7–15.5)
LYMPHS ABS: 2365 {cells}/uL (ref 850–3900)
Lymphocytes Relative: 55 %
MCH: 21 pg — AB (ref 27.0–33.0)
MCHC: 31.1 g/dL — ABNORMAL LOW (ref 32.0–36.0)
MCV: 67.5 fL — ABNORMAL LOW (ref 80.0–100.0)
MONO ABS: 344 {cells}/uL (ref 200–950)
Monocytes Relative: 8 %
NEUTROS PCT: 34 %
Neutro Abs: 1462 cells/uL — ABNORMAL LOW (ref 1500–7800)
Platelets: 250 10*3/uL (ref 140–400)
RBC: 6.28 MIL/uL — AB (ref 3.80–5.10)
RDW: 16.4 % — ABNORMAL HIGH (ref 11.0–15.0)
WBC: 4.3 10*3/uL (ref 3.8–10.8)

## 2016-06-17 LAB — COMPREHENSIVE METABOLIC PANEL
ALBUMIN: 4 g/dL (ref 3.6–5.1)
ALT: 49 U/L — ABNORMAL HIGH (ref 6–29)
AST: 29 U/L (ref 10–35)
Alkaline Phosphatase: 68 U/L (ref 33–130)
BUN: 13 mg/dL (ref 7–25)
CO2: 29 mmol/L (ref 20–31)
Calcium: 9.5 mg/dL (ref 8.6–10.4)
Chloride: 105 mmol/L (ref 98–110)
Creat: 0.87 mg/dL (ref 0.50–1.05)
Glucose, Bld: 90 mg/dL (ref 65–99)
POTASSIUM: 4.5 mmol/L (ref 3.5–5.3)
SODIUM: 142 mmol/L (ref 135–146)
Total Bilirubin: 0.3 mg/dL (ref 0.2–1.2)
Total Protein: 6.5 g/dL (ref 6.1–8.1)

## 2016-06-17 LAB — LIPID PANEL
Cholesterol: 252 mg/dL — ABNORMAL HIGH (ref 125–200)
HDL: 32 mg/dL — ABNORMAL LOW (ref >=46)
LDL CALC: 185 mg/dL — AB (ref <130)
TRIGLYCERIDES: 175 mg/dL — AB (ref <150)
Total CHOL/HDL Ratio: 7.9 Ratio — ABNORMAL HIGH (ref <=5.0)
VLDL: 35 mg/dL — AB (ref <30)

## 2016-06-17 NOTE — Assessment & Plan Note (Signed)
Check labs (see microcytosis)

## 2016-06-17 NOTE — Assessment & Plan Note (Signed)
Check ferritin, TIBC, iron, iron sat, hemoglobinopathy panel

## 2016-06-17 NOTE — Assessment & Plan Note (Signed)
Check labs 

## 2016-06-17 NOTE — Progress Notes (Signed)
Get additional labs, then have patient f/u with me in office; note to staff

## 2016-06-21 ENCOUNTER — Other Ambulatory Visit: Payer: Self-pay

## 2016-06-21 DIAGNOSIS — D508 Other iron deficiency anemias: Secondary | ICD-10-CM

## 2016-06-21 DIAGNOSIS — R7401 Elevation of levels of liver transaminase levels: Secondary | ICD-10-CM

## 2016-06-21 DIAGNOSIS — R740 Nonspecific elevation of levels of transaminase and lactic acid dehydrogenase [LDH]: Secondary | ICD-10-CM

## 2016-06-21 DIAGNOSIS — R718 Other abnormality of red blood cells: Secondary | ICD-10-CM

## 2016-06-24 ENCOUNTER — Other Ambulatory Visit: Payer: Self-pay | Admitting: Family Medicine

## 2016-06-24 LAB — IRON,TIBC AND FERRITIN PANEL
%SAT: 22 % (ref 11–50)
FERRITIN: 208 ng/mL (ref 10–232)
IRON: 71 ug/dL (ref 45–160)
TIBC: 319 ug/dL (ref 250–450)

## 2016-06-24 LAB — HEPATITIS PANEL, ACUTE
HCV Ab: NEGATIVE
HEP A IGM: NONREACTIVE
HEP B S AG: NEGATIVE
Hep B C IgM: NONREACTIVE

## 2016-06-27 LAB — PATHOLOGIST SMEAR REVIEW

## 2016-06-28 LAB — HEMOGLOBINOPATHY EVALUATION
HEMATOCRIT: 43.5 % (ref 35.0–45.0)
HEMOGLOBIN: 13.8 g/dL (ref 11.7–15.5)
HGB A2 QUANT: 1.7 % — AB (ref 1.8–3.5)
Hgb A: 97.3 % (ref >96.0)
Hgb F Quant: <1 % (ref <2.0)
MCH: 21.2 pg — AB (ref 27.0–33.0)
MCV: 66.9 fL — ABNORMAL LOW (ref 80.0–100.0)
RBC: 6.5 MIL/uL — AB (ref 3.80–5.10)
RDW: 16.3 % — AB (ref 11.0–15.0)

## 2016-07-09 ENCOUNTER — Telehealth: Payer: Self-pay | Admitting: Family Medicine

## 2016-07-09 DIAGNOSIS — R718 Other abnormality of red blood cells: Secondary | ICD-10-CM

## 2016-07-09 DIAGNOSIS — D508 Other iron deficiency anemias: Secondary | ICD-10-CM

## 2016-07-09 DIAGNOSIS — E785 Hyperlipidemia, unspecified: Secondary | ICD-10-CM

## 2016-07-09 MED ORDER — DIAZEPAM 5 MG PO TABS: 5.0000 mg | ORAL_TABLET | Freq: Three times a day (TID) | ORAL | 0 refills | Status: DC | PRN

## 2016-07-09 MED ORDER — DIAZEPAM 5 MG PO TABS: 2.5000 mg | ORAL_TABLET | Freq: Three times a day (TID) | ORAL | 0 refills | Status: DC | PRN

## 2016-07-09 NOTE — Assessment & Plan Note (Signed)
Suspect alpha thalassemia; refer to hematologist 

## 2016-07-09 NOTE — Assessment & Plan Note (Signed)
Suspect alpha thalassemia; refer to hematologist

## 2016-07-09 NOTE — Assessment & Plan Note (Signed)
Check lipids after 3 months of TLC

## 2016-07-09 NOTE — Telephone Encounter (Signed)
I talked w/patient Vanessa Gross, she doesn't want to take medicine; she'll try diet and recheck in 3 months Explained iron is fine, suspect an inherited condition with her red blood cells, but I'll send her to hematologist to be sure; nothing to be worried about, but will be helpful for her to know; she doesn't recall ever being told anything except she needed some iron before Liver enzyme up just a bit; she does not drink alcohol She has prolonged episodes of vertigo; needs refill of her diazepam; [facility], [facility] gave her 16 pills and her appt with ENT isn't until Sept; I'll call in Rx for diazepam -------------------------------- Detailed Rx left on prescriber line

## 2016-08-04 ENCOUNTER — Inpatient Hospital Stay: Payer: BLUE CROSS/BLUE SHIELD | Attending: Internal Medicine | Admitting: Internal Medicine

## 2016-08-04 ENCOUNTER — Inpatient Hospital Stay: Payer: BLUE CROSS/BLUE SHIELD

## 2016-08-04 ENCOUNTER — Encounter: Payer: Self-pay | Admitting: Internal Medicine

## 2016-08-04 ENCOUNTER — Encounter (INDEPENDENT_AMBULATORY_CARE_PROVIDER_SITE_OTHER): Payer: Self-pay

## 2016-08-04 VITALS — BP 171/11 | HR 74 | Temp 97.6°F | Resp 17 | Ht 66.0 in | Wt 217.2 lb

## 2016-08-04 DIAGNOSIS — E669 Obesity, unspecified: Secondary | ICD-10-CM | POA: Diagnosis not present

## 2016-08-04 DIAGNOSIS — H8110 Benign paroxysmal vertigo, unspecified ear: Secondary | ICD-10-CM | POA: Insufficient documentation

## 2016-08-04 DIAGNOSIS — D509 Iron deficiency anemia, unspecified: Secondary | ICD-10-CM | POA: Diagnosis not present

## 2016-08-04 DIAGNOSIS — G8929 Other chronic pain: Secondary | ICD-10-CM | POA: Diagnosis not present

## 2016-08-04 DIAGNOSIS — I8393 Asymptomatic varicose veins of bilateral lower extremities: Secondary | ICD-10-CM

## 2016-08-04 DIAGNOSIS — I10 Essential (primary) hypertension: Secondary | ICD-10-CM | POA: Insufficient documentation

## 2016-08-04 DIAGNOSIS — Z79899 Other long term (current) drug therapy: Secondary | ICD-10-CM | POA: Diagnosis not present

## 2016-08-04 DIAGNOSIS — E785 Hyperlipidemia, unspecified: Secondary | ICD-10-CM

## 2016-08-04 DIAGNOSIS — L603 Nail dystrophy: Secondary | ICD-10-CM

## 2016-08-04 DIAGNOSIS — M545 Low back pain: Secondary | ICD-10-CM

## 2016-08-04 DIAGNOSIS — R718 Other abnormality of red blood cells: Secondary | ICD-10-CM

## 2016-08-04 LAB — RETICULOCYTES
RBC.: 6.76 MIL/uL — AB (ref 3.80–5.20)
RETIC COUNT ABSOLUTE: 60.8 10*3/uL (ref 19.0–183.0)
RETIC CT PCT: 0.9 % (ref 0.4–3.1)

## 2016-08-04 LAB — CBC WITH DIFFERENTIAL/PLATELET
BASOS ABS: 0 10*3/uL (ref 0–0.1)
BASOS PCT: 0 %
EOS PCT: 1 %
Eosinophils Absolute: 0.1 10*3/uL (ref 0–0.7)
HEMATOCRIT: 44.9 % (ref 35.0–47.0)
Hemoglobin: 14.2 g/dL (ref 12.0–16.0)
Lymphocytes Relative: 53 %
Lymphs Abs: 2.6 10*3/uL (ref 1.0–3.6)
MCH: 21 pg — ABNORMAL LOW (ref 26.0–34.0)
MCHC: 31.6 g/dL — AB (ref 32.0–36.0)
MCV: 66.5 fL — ABNORMAL LOW (ref 80.0–100.0)
MONO ABS: 0.4 10*3/uL (ref 0.2–0.9)
MONOS PCT: 8 %
NEUTROS ABS: 1.9 10*3/uL (ref 1.4–6.5)
Neutrophils Relative %: 38 %
PLATELETS: 257 10*3/uL (ref 150–440)
RBC: 6.76 MIL/uL — ABNORMAL HIGH (ref 3.80–5.20)
RDW: 16 % — AB (ref 11.5–14.5)
WBC: 4.9 10*3/uL (ref 3.6–11.0)

## 2016-08-04 LAB — FERRITIN: Ferritin: 209 ng/mL (ref 11–307)

## 2016-08-04 LAB — IRON AND TIBC
Iron: 56 ug/dL (ref 28–170)
SATURATION RATIOS: 14 % (ref 10.4–31.8)
TIBC: 388 ug/dL (ref 250–450)
UIBC: 332 ug/dL

## 2016-08-04 NOTE — Assessment & Plan Note (Addendum)
Microcytosis- MCV around 66; normal hemoglobin 13. 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 Recommend checking hemoglobin electrophoresis. Repeat iron studies CBC LDH haptoglobin.    # Follow-up in about 2 weeks or so.  Thank you Dr. Sherie DonLada for allowing me to participate in the care of your pleasant patient. Please do not hesitate to contact me with questions or concerns in the interim.

## 2016-08-04 NOTE — Progress Notes (Signed)
[facility] CONSULT NOTE  Patient Care Team: Kerman PasseyMelinda P Lada, MD as PCP - General (Family Medicine)  CHIEF COMPLAINTS/PURPOSE OF CONSULTATION: Microcytosis/hypochromia.   # MICROCYTOSIS/ Hb 13- MCV 66  No history exists.     HISTORY OF PRESENTING ILLNESS:  Vanessa Gross 60 y.o.  female referred to us for further evaluation of microcytosis/hypochromia.  Patient admits to mild fatigue. Denies any blood in stools black stools. Patient had a hysterectomy because of fibroids. Patient had been on by mouth iron prior to hysterectomy because of menorrhagia.  Complains of brittle hair and brittle nails. Otherwise no weight loss. No difficulty swallowing or pain with swallowing. No blood in urine.   ROS: A complete 10 point review of system is done which is negative except mentioned above in history of present illness  MEDICAL HISTORY:  Past Medical History:  Diagnosis Date  . Benign paroxysmal positional vertigo    bilateral  . Chronic radicular lumbar pain   . Hair loss   . HTN, goal below 140/90   . Hyperlipidemia LDL goal <100   . Obesity, Class I, BMI 30.0-34.9 (see actual BMI)   . 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     SURGICAL HISTORY: Past Surgical History:  Procedure Laterality Date  . ABDOMINAL HYSTERECTOMY    . BREAST BIOPSY Left    benign  . TONSILLECTOMY AND ADENOIDECTOMY      SOCIAL HISTORY: Social History   Social History  . Marital status: Married    Spouse name: N/A  . Number of children: N/A  . Years of education: N/A   Occupational History  . Not on file.   Social History Main Topics  . Smoking status: Never Smoker  . Smokeless tobacco: Never Used  . Alcohol use No  . Drug use: No  . Sexual activity: Not Currently   Other Topics Concern  . Not on file   Social History Narrative  . No narrative on file    FAMILY HISTORY: Family History  Problem Relation Age of Onset  . Hypertension Mother      ALLERGIES:  is allergic to celecoxib and gabapentin.  MEDICATIONS:  Current Outpatient Prescriptions  Medication Sig Dispense Refill  . Biotin 1 MG CAPS Take by mouth.    . Cholecalciferol (D3-1000) 1000 units tablet Take 1,000 Units by mouth daily.    . diazepam (VALIUM) 5 MG tablet Take 0.5-1 tablets (2.5-5 mg total) by mouth every 8 (eight) hours as needed (vertigo). 30 tablet 0  . furosemide (LASIX) 20 MG tablet Take 20 mg by mouth 2 (two) times daily.    Marland Kitchen. ibuprofen (ADVIL,MOTRIN) 800 MG tablet Take 800 mg by mouth every 8 (eight) hours as needed.    . Multiple Vitamin (MULTI-VITAMINS) TABS Take by mouth.    . Multiple Vitamins-Minerals (HAIR SKIN AND NAILS FORMULA) TABS Take by mouth.    . Naproxen Sodium (ALEVE) 220 MG CAPS Take by mouth.     No current facility-administered medications for this visit.       Marland Kitchen.  PHYSICAL EXAMINATION: ECOG PERFORMANCE STATUS: 0 - Asymptomatic  Vitals:   08/04/16 1525 08/04/16 1528  BP: (!) 173/111 (!) 171/11  Pulse:  74  Resp: 17   Temp: 97.6 F (36.4 C)    Filed Weights   08/04/16 1525  Weight: 217 lb 3.2 oz (98.5 kg)    GENERAL: Well-nourished well-developed; Alert, no distress and comfortable.   With family EYES: no pallor or  icterus OROPHARYNX: no thrush or ulceration; good dentition  NECK: supple, no masses felt LYMPH:  no palpable lymphadenopathy in the cervical, axillary or inguinal regions LUNGS: clear to auscultation and  No wheeze or crackles HEART/CVS: regular rate & rhythm and no murmurs; No lower extremity edema ABDOMEN: abdomen soft, non-tender and normal bowel sounds Musculoskeletal:no cyanosis of digits and no clubbing  PSYCH: alert & oriented x 3 with fluent speech; very anxious; tearful NEURO: no focal motor/sensory deficits SKIN:  no rashes or significant lesions  LABORATORY DATA:  I have reviewed the data as listed Lab Results  Component Value Date   WBC 4.3 06/17/2016   HGB 13.8 06/24/2016   HCT  43.5 06/24/2016   MCV 66.9 (L) 06/24/2016   PLT 250 06/17/2016    Recent Labs  06/17/16 0908  NA 142  K 4.5  CL 105  CO2 29  GLUCOSE 90  BUN 13  CREATININE 0.87  CALCIUM 9.5  PROT 6.5  ALBUMIN 4.0  AST 29  ALT 49*  ALKPHOS 68  BILITOT 0.3    RADIOGRAPHIC STUDIES: I have personally reviewed the radiological images as listed and agreed with the findings in the report. No results found.  ASSESSMENT & PLAN:   RBC microcytosis Microcytosis- MCV around 66; normal hemoglobin 13. 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 Recommend checking hemoglobin electrophoresis. Repeat iron studies CBC LDH haptoglobin.    # Follow-up in about 2 weeks or so.  Thank you Dr. Sherie Don for allowing me to participate in the care of your pleasant patient. Please do not hesitate to contact me with questions or concerns in the interim.   All questions were answered. The patient knows to call the clinic with any problems, questions or concerns.     Earna Coder, MD 08/04/2016 3:57 PM

## 2016-08-05 LAB — HAPTOGLOBIN: Haptoglobin: 131 mg/dL (ref 34–200)

## 2016-08-08 LAB — HEMOGLOBINOPATHY EVALUATION
HGB A2 QUANT: 1.8 % (ref 0.7–3.1)
HGB A: 98.2 % — AB (ref 94.0–98.0)
HGB C: 0 %
Hgb F Quant: 0 % (ref 0.0–2.0)
Hgb S Quant: 0 %

## 2016-08-19 ENCOUNTER — Ambulatory Visit: Payer: BLUE CROSS/BLUE SHIELD | Admitting: Internal Medicine

## 2016-08-19 ENCOUNTER — Inpatient Hospital Stay: Payer: BLUE CROSS/BLUE SHIELD | Attending: Internal Medicine | Admitting: Internal Medicine

## 2016-08-19 VITALS — BP 161/94 | HR 73 | Temp 97.6°F | Resp 20 | Ht 66.0 in | Wt 220.0 lb

## 2016-08-19 DIAGNOSIS — E785 Hyperlipidemia, unspecified: Secondary | ICD-10-CM | POA: Diagnosis not present

## 2016-08-19 DIAGNOSIS — E669 Obesity, unspecified: Secondary | ICD-10-CM

## 2016-08-19 DIAGNOSIS — M545 Low back pain: Secondary | ICD-10-CM | POA: Diagnosis not present

## 2016-08-19 DIAGNOSIS — R718 Other abnormality of red blood cells: Secondary | ICD-10-CM | POA: Insufficient documentation

## 2016-08-19 DIAGNOSIS — L603 Nail dystrophy: Secondary | ICD-10-CM | POA: Diagnosis not present

## 2016-08-19 DIAGNOSIS — I10 Essential (primary) hypertension: Secondary | ICD-10-CM | POA: Diagnosis not present

## 2016-08-19 DIAGNOSIS — I8390 Asymptomatic varicose veins of unspecified lower extremity: Secondary | ICD-10-CM | POA: Diagnosis not present

## 2016-08-19 DIAGNOSIS — N920 Excessive and frequent menstruation with regular cycle: Secondary | ICD-10-CM | POA: Insufficient documentation

## 2016-08-19 DIAGNOSIS — H8110 Benign paroxysmal vertigo, unspecified ear: Secondary | ICD-10-CM | POA: Insufficient documentation

## 2016-08-19 NOTE — Progress Notes (Signed)
Patient here for follow up for lab results

## 2016-08-19 NOTE — Assessment & Plan Note (Addendum)
Microcytosis- MCV around 66; normal hemoglobin 13. Iron studies normal ferritin/saturation 22%- not suggestive of Iron deficiency. Hemoglobin electrophoresis- Normal. Patient is asymptomatic. At this time I would not recommend any further workup  # Recommend sending us- previous bloodwork results for comparison. Patient agrees. .  # follow up in 6 months/labs- iron studies/ldh/ cbc few days prior.

## 2016-08-19 NOTE — Progress Notes (Signed)
[facility] CONSULT NOTE  Patient Care Team: Kerman PasseyMelinda P Lada, MD as PCP - General (Family Medicine)  CHIEF COMPLAINTS/PURPOSE OF CONSULTATION: Microcytosis/hypochromia.   # MICROCYTOSIS/ Hb 13- MCV 66; hemoglobin electrophoresis-normal  No history exists.     HISTORY OF PRESENTING ILLNESS:  Vanessa Gross 60 y.o.  female is here to review the results of her blood work done for microcytosis.  Patient had been on by mouth iron prior to hysterectomy because of menorrhagia. Complains of brittle hair and brittle nails. Otherwise no weight loss. No difficulty swallowing or pain with swallowing. No blood in urine. Mild chronic fatigue. Not any worse.  ROS: A complete 10 point review of system is done which is negative except mentioned above in history of present illness  MEDICAL HISTORY:  Past Medical History:  Diagnosis Date  . Benign paroxysmal positional vertigo    bilateral  . Chronic radicular lumbar pain   . Hair loss   . HTN, goal below 140/90   . Hyperlipidemia LDL goal <100   . Obesity, Class I, BMI 30.0-34.9 (see actual BMI)   . 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     SURGICAL HISTORY: Past Surgical History:  Procedure Laterality Date  . ABDOMINAL HYSTERECTOMY    . BREAST BIOPSY Left    benign  . TONSILLECTOMY AND ADENOIDECTOMY      SOCIAL HISTORY: Social History   Social History  . Marital status: Married    Spouse name: N/A  . Number of children: N/A  . Years of education: N/A   Occupational History  . Not on file.   Social History Main Topics  . Smoking status: Never Smoker  . Smokeless tobacco: Never Used  . Alcohol use No  . Drug use: No  . Sexual activity: Not Currently   Other Topics Concern  . Not on file   Social History Narrative  . No narrative on file    FAMILY HISTORY: Family History  Problem Relation Age of Onset  . Hypertension Mother     ALLERGIES:  is allergic to celecoxib and  gabapentin.  MEDICATIONS:  Current Outpatient Prescriptions  Medication Sig Dispense Refill  . Cholecalciferol (D3-1000) 1000 units tablet Take 1,000 Units by mouth daily.    . diazepam (VALIUM) 5 MG tablet Take 0.5-1 tablets (2.5-5 mg total) by mouth every 8 (eight) hours as needed (vertigo). 30 tablet 0  . furosemide (LASIX) 20 MG tablet Take 20 mg by mouth 2 (two) times daily.    . IRON, FERROUS GLUCONATE, PO Take 1 tablet by mouth daily.    . Multiple Vitamin (MULTI-VITAMINS) TABS Take by mouth.    . Multiple Vitamins-Minerals (HAIR SKIN AND NAILS FORMULA) TABS Take by mouth.    . naproxen sodium (ANAPROX) 220 MG tablet Take 220 mg by mouth once.    . Biotin 1 MG CAPS Take by mouth.     No current facility-administered medications for this visit.       Marland Kitchen.  PHYSICAL EXAMINATION: ECOG PERFORMANCE STATUS: 0 - Asymptomatic  Vitals:   08/19/16 0908  BP: (!) 161/94  Pulse: 73  Resp: 20  Temp: 97.6 F (36.4 C)   Filed Weights   08/19/16 0908  Weight: 220 lb (99.8 kg)    GENERAL: Well-nourished well-developed; Alert, no distress and comfortable.   With family EYES: no pallor or icterus OROPHARYNX: no thrush or ulceration; good dentition  NECK: supple, no masses felt LYMPH:  no palpable lymphadenopathy in  the cervical, axillary or inguinal regions LUNGS: clear to auscultation and  No wheeze or crackles HEART/CVS: regular rate & rhythm and no murmurs; No lower extremity edema ABDOMEN: abdomen soft, non-tender and normal bowel sounds Musculoskeletal:no cyanosis of digits and no clubbing  PSYCH: alert & oriented x 3 with fluent speech; very anxious; tearful NEURO: no focal motor/sensory deficits SKIN:  no rashes or significant lesions  LABORATORY DATA:  I have reviewed the data as listed Lab Results  Component Value Date   WBC 4.9 08/04/2016   HGB 14.2 08/04/2016   HCT 44.9 08/04/2016   MCV 66.5 (L) 08/04/2016   PLT 257 08/04/2016    Recent Labs  06/17/16 0908   NA 142  K 4.5  CL 105  CO2 29  GLUCOSE 90  BUN 13  CREATININE 0.87  CALCIUM 9.5  PROT 6.5  ALBUMIN 4.0  AST 29  ALT 49*  ALKPHOS 68  BILITOT 0.3    RADIOGRAPHIC STUDIES: I have personally reviewed the radiological images as listed and agreed with the findings in the report. No results found.  ASSESSMENT & PLAN:   RBC microcytosis Microcytosis- MCV around 66; normal hemoglobin 13. Iron studies normal ferritin/saturation 22%- not suggestive of Iron deficiency. Hemoglobin electrophoresis- Normal. Patient is asymptomatic. At this time I would not recommend any further workup  # Recommend sending [place]- previous bloodwork results for comparison. Patient agrees. .  # follow up in 6 months/labs- iron studies/ldh/ cbc few days prior.   All questions were answered. The patient knows to call the clinic with any problems, questions or concerns.     Earna Coder, MD 08/20/2016 10:47 AM

## 2016-08-22 ENCOUNTER — Other Ambulatory Visit: Payer: Self-pay

## 2016-08-22 DIAGNOSIS — D508 Other iron deficiency anemias: Secondary | ICD-10-CM

## 2016-10-25 ENCOUNTER — Other Ambulatory Visit: Payer: Self-pay | Admitting: Family Medicine

## 2016-10-25 NOTE — Telephone Encounter (Signed)
Patient has scheduled appointment for 11/15/15. She is asking for a refill on diazepam and furosemide. Please send to [facility] [address]

## 2016-10-25 NOTE — Telephone Encounter (Signed)
I took furosemide off of her med list in August, so I don't plan on refilling this For the diazepam, that is a controlled substance We understood that she was going to see ENT in September for the dizziness Please ask her to contact ENT for refills or she'll need an appt with me; thank you

## 2016-10-26 ENCOUNTER — Telehealth: Payer: Self-pay

## 2016-10-26 MED ORDER — DIAZEPAM 5 MG PO TABS: 2.5000 mg | ORAL_TABLET | Freq: Three times a day (TID) | ORAL | 0 refills | Status: DC | PRN

## 2016-10-26 MED ORDER — TRIAMTERENE-HCTZ 37.5-25 MG PO TABS: 1.0000 | ORAL_TABLET | Freq: Every day | ORAL | 0 refills | Status: DC

## 2016-10-26 NOTE — Telephone Encounter (Signed)
I tried home and work number; unable to reach her; left message at work number

## 2016-10-26 NOTE — Telephone Encounter (Signed)
[phone] home [phone] Work  Please call this patient she was being very rude to me states she needs her diazepam and bp pill (furosemide which I told her is a fluid pill and in the note you wrote to me you stated you took her off of this).  Patient states does not understand and is very upset.  Please call and explain.  She states cannot be out of these meds it will make her sick and already has an appt with you on 1/8. Please advise

## 2016-10-26 NOTE — Telephone Encounter (Signed)
She says she needs help with her blood pressure We reviewed her previous meds; she had been on bisoprolol-HCTZ in the past; also amlodipine, but does not want swelling; already has problem with swelling Let's start triamterene/HCTZ; I see her last two BPs at heme-on[facility]; 160 and 170 systolic; the furosemide is not a good anti-hypertensive, I explained, and can cause potassium depletion; she agrees with starting new medicine Check BP in 2 weeks Watching what she eats, cutting back on her salt She cannot afford to go to the ENT She says she is saved and does not drink alcohol and does not like to take medicines; refill approved for diazepam; she has appt Jan 8th We'll monitor BP and get BMP at that visit [facility]; patient never picked up the bisoprolol-HCTZ that was ordered back in August; I canceled that and explained patient wanting to use new pharmacy now I phoned in the diazepam to female pharmacist; pen died; he asked if it was #90, and I said no, #30; verified

## 2016-10-26 NOTE — Telephone Encounter (Signed)
Left detailed voicemail

## 2016-11-01 ENCOUNTER — Other Ambulatory Visit: Payer: Self-pay

## 2016-11-01 NOTE — Telephone Encounter (Signed)
I already talked to the patient about this We are using triamterene-hctz now; NOT furosemide She should not take furosemide any longer

## 2016-11-01 NOTE — Telephone Encounter (Signed)
[facility] pharmacist has been notified patient is no longer on Furosemide and is discontinuing it from her file.

## 2016-11-01 NOTE — Telephone Encounter (Signed)
Patient requesting refill of Furosemide to [facility].

## 2016-11-04 ENCOUNTER — Ambulatory Visit (INDEPENDENT_AMBULATORY_CARE_PROVIDER_SITE_OTHER): Payer: BLUE CROSS/BLUE SHIELD

## 2016-11-04 DIAGNOSIS — Z23 Encounter for immunization: Secondary | ICD-10-CM | POA: Diagnosis not present

## 2016-11-14 ENCOUNTER — Ambulatory Visit: Payer: BLUE CROSS/BLUE SHIELD | Admitting: Family Medicine

## 2016-11-17 ENCOUNTER — Telehealth: Payer: Self-pay

## 2016-11-17 ENCOUNTER — Ambulatory Visit (INDEPENDENT_AMBULATORY_CARE_PROVIDER_SITE_OTHER): Payer: BLUE CROSS/BLUE SHIELD | Admitting: Family Medicine

## 2016-11-17 ENCOUNTER — Encounter: Payer: Self-pay | Admitting: Family Medicine

## 2016-11-17 VITALS — BP 136/78 | HR 94 | Temp 98.5°F | Resp 18 | Ht 66.0 in | Wt 213.0 lb

## 2016-11-17 DIAGNOSIS — R05 Cough: Secondary | ICD-10-CM

## 2016-11-17 DIAGNOSIS — J069 Acute upper respiratory infection, unspecified: Secondary | ICD-10-CM | POA: Diagnosis not present

## 2016-11-17 DIAGNOSIS — R059 Cough, unspecified: Secondary | ICD-10-CM

## 2016-11-17 DIAGNOSIS — B9789 Other viral agents as the cause of diseases classified elsewhere: Secondary | ICD-10-CM | POA: Diagnosis not present

## 2016-11-17 MED ORDER — BENZONATATE 100 MG PO CAPS: 100.0000 mg | ORAL_CAPSULE | Freq: Two times a day (BID) | ORAL | 0 refills | Status: DC | PRN

## 2016-11-17 MED ORDER — BUDESONIDE-FORMOTEROL FUMARATE 160-4.5 MCG/ACT IN AERO: 2.0000 | INHALATION_SPRAY | Freq: Two times a day (BID) | RESPIRATORY_TRACT | 0 refills | Status: DC

## 2016-11-17 MED ORDER — FLUTICASONE FUROATE-VILANTEROL 100-25 MCG/INH IN AEPB: 1.0000 | INHALATION_SPRAY | Freq: Every day | RESPIRATORY_TRACT | 0 refills | Status: DC

## 2016-11-17 NOTE — Progress Notes (Signed)
Name: Vanessa Gross   MRN: [id number]    DOB: 09/30/1956   Date:11/17/2016       Progress Note  Subjective  Chief Complaint  Chief Complaint  Patient presents with  . Cough    Onset-1 week, worst at night and symptoms have progressively gotten worst, dry cough, sneezing, congestion, and very fatigue. Patient was told she could not take much over the counter due to BP.  Marland Kitchen Sore Throat    Trouble swallowing and sore throat due to excessive coughing    HPI  URI: symptoms started about one week ago, her husband was sick first and recently diagnosed with bronchitis. She has clear rhinorrhea, nasal congestion, sneezing, and is tired of coughing. Staying up during the night because of the cough. She had some chills last night. She has been wheezing and has occasionally SOB. She has been going to work. She feels that she is getting worse. She has noticed a sore throat, and a decrease in her appetite. She has no history of asthma, and is not a smoker.  She took Mucinex this morning ( first otc medication ) , she has been taking hot tea with honey and lemon.    Patient Active Problem List   Diagnosis Date Noted  . Elevated alanine aminotransferase (ALT) level 06/17/2016  . 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 Obesity 06/07/2016  . Medication monitoring encounter 06/07/2016  . Allergic conjunctivitis and rhinitis 10/19/2015  . Shortness of breath dyspnea 06/30/2015  . Anxiety 05/29/2015  . Reflux esophagitis 05/29/2015  . Alopecia 05/29/2015  . Hemorrhoids 05/29/2015  . HLD (hyperlipidemia) 05/29/2015  . Insomnia 05/29/2015  . Menopausal and perimenopausal disorder 05/29/2015  . Surgical or procedure not carried out because of patient's decision 05/29/2015  . Pain and swelling of left ankle 04/15/2015  . Nerve root pain 11/26/2009  . Benign paroxysmal positional nystagmus 08/11/2009  . Bilateral edema of lower extremity 12/01/2008  . Essential  hypertension, benign 05/25/2007  . 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 ABDOMINAL HYSTERECTOMY    . BREAST BIOPSY Left    benign  . TONSILLECTOMY AND ADENOIDECTOMY      Family History  Problem Relation Age of Onset  . Hypertension Mother     Social History   Social History  . Marital status: Married    Spouse name: N/A  . Number of children: N/A  . Years of education: N/A   Occupational History  . Not on file.   Social History Main Topics  . Smoking status: Never Smoker  . Smokeless tobacco: Never Used  . Alcohol use No  . Drug use: No  . Sexual activity: Not Currently   Other Topics Concern  . Not on file   Social History Narrative  . No narrative on file     Current Outpatient Prescriptions:  .  Biotin 1 MG CAPS, Take by mouth., Disp: , Rfl:  .  Cholecalciferol (D3-1000) 1000 units tablet, Take 1,000 Units by mouth daily., Disp: , Rfl:  .  diazepam (VALIUM) 5 MG tablet, Take 0.5-1 tablets (2.5-5 mg total) by mouth every 8 (eight) hours as needed (vertigo)., Disp: 30 tablet, Rfl: 0 .  Multiple Vitamin (MULTI-VITAMINS) TABS, Take by mouth., Disp: , Rfl:  .  Multiple Vitamins-Minerals (HAIR SKIN AND NAILS FORMULA) TABS, Take by mouth., Disp: , Rfl:  .  triamterene-hydrochlorothiazide (MAXZIDE-25) 37.5-25 MG tablet,  Take 1 tablet by mouth daily., Disp: 30 tablet, Rfl: 0 .  IRON, FERROUS GLUCONATE, PO, Take 1 tablet by mouth daily., Disp: , Rfl:  .  naproxen sodium (ANAPROX) 220 MG tablet, Take 220 mg by mouth once., Disp: , Rfl:   Allergies  Allergen Reactions  . Celecoxib   . Gabapentin     abnormal dreams     ROS  Ten systems reviewed and is negative except as mentioned in HPI   Objective  Vitals:   11/17/16 1031  BP: 136/78  Pulse: 94  Resp: 18  Temp: 98.5 F (36.9 C)  TempSrc: Oral  SpO2: 96%  Weight: 213 lb (96.6 kg)  Height: 5\' 6"  (1.676 m)     Body mass index is 34.38 kg/m.  Physical Exam  Constitutional: Patient appears well-developed and well-nourished. Obese  No distress.  HEENT: head atraumatic, normocephalic, pupils equal and reactive to light, ears normal TM bilaterally,  neck supple, throat within normal limits, clear rhinorrhea, boggy turbinates Cardiovascular: Normal rate, regular rhythm and normal heart sounds.  No murmur heard. No BLE edema. Pulmonary/Chest: Effort normal and breath sounds normal. No respiratory distress. Abdominal: Soft.  There is no tenderness. Psychiatric: Patient has a normal mood and affect. behavior is normal. Judgment and thought content normal.  PHQ2/9: Depression screen [facility] 11/17/2016 06/07/2016 10/19/2015 05/29/2015  Decreased Interest 0 0 0 0  Down, Depressed, Hopeless 0 0 0 0  PHQ - 2 Score 0 0 0 0     Fall Risk: Fall Risk  06/07/2016 10/19/2015 05/29/2015  Falls in the past year? No No No     Functional Status Survey: Does the patient have difficulty seeing, even when wearing glasses/contacts?: No Does the patient have difficulty concentrating, remembering, or making decisions?: No Does the patient have difficulty walking or climbing stairs?: No Does the patient have difficulty dressing or bathing?: No Does the patient have difficulty doing errands alone such as visiting a doctor's office or shopping?: No    Assessment & Plan  1. Viral upper respiratory tract infection  Patient is upset for not getting antibiotics - advised to call Dr. Sherie DonLada, her pcp next week - when she is back  - fluticasone furoate-vilanterol (BREO ELLIPTA) 100-25 MCG/INH AEPB; Inhale 1 puff into the lungs daily.  Dispense: 60 each; Refill: 0 - benzonatate (TESSALON) 100 MG capsule; Take 1-2 capsules (100-200 mg total) by mouth 2 (two) times daily as needed.  Dispense: 40 capsule; Refill: 0  2. Cough  - fluticasone furoate-vilanterol (BREO ELLIPTA) 100-25 MCG/INH AEPB; Inhale 1 puff into the lungs  daily.  Dispense: 60 each; Refill: 0 - benzonatate (TESSALON) 100 MG capsule; Take 1-2 capsules (100-200 mg total) by mouth 2 (two) times daily as needed.  Dispense: 40 capsule; Refill: 0

## 2016-11-17 NOTE — Addendum Note (Signed)
Addended by: Alba CorySOWLES, Merilyn Pagan F on: 11/17/2016 02:21 PM   Modules accepted: Orders

## 2016-11-17 NOTE — Telephone Encounter (Signed)
Ask them if there is a voucher, I would prefer not giving her prednisone

## 2016-11-17 NOTE — Telephone Encounter (Signed)
Patient notified and sent in Symbicort to [facility]- coupon was called in and it will be free for patient.

## 2016-11-17 NOTE — Telephone Encounter (Signed)
Pharmacy called states she cannot afford Virgel BouquetBreo it will cost $200.  Pharmacy states Maybe you can give prednisone small dose?  Please advise

## 2016-11-23 ENCOUNTER — Other Ambulatory Visit: Payer: Self-pay | Admitting: Family Medicine

## 2016-11-23 DIAGNOSIS — Z5181 Encounter for therapeutic drug level monitoring: Secondary | ICD-10-CM

## 2016-11-23 NOTE — Telephone Encounter (Signed)
Please contact patient Vanessa Gross was due to have her cholesterol rechecked in November Now it's also time to recheck her kidneys and liver; please have her get fasting lipid panel and CMP in the next week or so (I ordered CMP, fasting lipid panel orders should already be in the system) Thank you I'll refill med in the meantime

## 2016-11-23 NOTE — Assessment & Plan Note (Signed)
Check labs 

## 2016-11-25 NOTE — Telephone Encounter (Signed)
Patient notified

## 2016-12-12 ENCOUNTER — Other Ambulatory Visit: Payer: Self-pay | Admitting: Family Medicine

## 2016-12-12 DIAGNOSIS — E785 Hyperlipidemia, unspecified: Secondary | ICD-10-CM

## 2016-12-12 DIAGNOSIS — Z5181 Encounter for therapeutic drug level monitoring: Secondary | ICD-10-CM

## 2016-12-14 ENCOUNTER — Encounter: Payer: Self-pay | Admitting: Family Medicine

## 2016-12-20 ENCOUNTER — Ambulatory Visit (INDEPENDENT_AMBULATORY_CARE_PROVIDER_SITE_OTHER): Payer: BLUE CROSS/BLUE SHIELD | Admitting: Family Medicine

## 2016-12-20 ENCOUNTER — Encounter: Payer: Self-pay | Admitting: Family Medicine

## 2016-12-20 DIAGNOSIS — I10 Essential (primary) hypertension: Secondary | ICD-10-CM

## 2016-12-20 DIAGNOSIS — H8110 Benign paroxysmal vertigo, unspecified ear: Secondary | ICD-10-CM | POA: Diagnosis not present

## 2016-12-20 DIAGNOSIS — Z79899 Other long term (current) drug therapy: Secondary | ICD-10-CM

## 2016-12-20 DIAGNOSIS — R600 Localized edema: Secondary | ICD-10-CM

## 2016-12-20 DIAGNOSIS — E782 Mixed hyperlipidemia: Secondary | ICD-10-CM

## 2016-12-20 DIAGNOSIS — H1013 Acute atopic conjunctivitis, bilateral: Secondary | ICD-10-CM

## 2016-12-20 DIAGNOSIS — J309 Allergic rhinitis, unspecified: Secondary | ICD-10-CM

## 2016-12-20 MED ORDER — DIAZEPAM 5 MG PO TABS: 2.5000 mg | ORAL_TABLET | Freq: Three times a day (TID) | ORAL | 1 refills | Status: DC | PRN

## 2016-12-20 MED ORDER — TRIAMTERENE-HCTZ 37.5-25 MG PO TABS: 1.0000 | ORAL_TABLET | Freq: Every day | ORAL | 5 refills | Status: DC

## 2016-12-20 NOTE — Patient Instructions (Addendum)
Try to use PLAIN allergy medicine without the decongestant Avoid: phenylephrine, phenylpropanolamine, and pseudoephredine  Return in 3 months We'll call you with your lab results

## 2016-12-20 NOTE — Progress Notes (Signed)
BP 132/84   Pulse 87   Temp 97.6 F (36.4 C) (Oral)   Resp 14   Wt 215 lb (97.5 kg)   SpO2 97%   BMI 34.70 kg/m    Subjective:    Patient ID: Vanessa Gross, female    DOB: December 02, 1955, 61 y.o.   MRN: [id number]  HPI: Vanessa Gross is a 61 y.o. female  Chief Complaint  Patient presents with  . Follow-up  . Medication Refill   Had bronchitis in January; still occasional cough; no fevers; finally got her inhaler; husband had the same thing  HTN; on the triamterene-hctz and the swelling is better; no excessive urination Limits salt Does not use decongestants  She is using diazepam working well; one pill left; uses for inner ear; gets episodes of inner ear every now and then; just one pill left; no alcohol at all  Sees Dr. B for her small red blood cells; she has had multiple labs done, and is due for additional labs in April 2018  She has high cholesterol; not taking any medicine for this Previous LDL was 185 in August 2017  Depression screen [facility] 2/9 12/20/2016 11/17/2016 06/07/2016 10/19/2015 05/29/2015  Decreased Interest 0 0 0 0 0  Down, Depressed, Hopeless 0 0 0 0 0  PHQ - 2 Score 0 0 0 0 0   Relevant past medical, surgical, family and social history reviewed Past Medical History:  Diagnosis Date  . Benign paroxysmal positional vertigo    bilateral  . Chronic radicular lumbar pain   . Hair loss   . HTN, goal below 140/90   . Hyperlipidemia LDL goal <100   . Obesity, Class I, BMI 30.0-34.9 (see actual BMI)   . 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 ABDOMINAL HYSTERECTOMY    . BREAST BIOPSY Left    benign  . TONSILLECTOMY AND ADENOIDECTOMY     Family History  Problem Relation Age of Onset  . Hypertension Mother   . Cancer Mother     lpymphoma   Social History  Substance Use Topics  . Smoking status: Never Smoker  . Smokeless tobacco: Never Used  . Alcohol use No     Interim medical history since last visit reviewed. Allergies and medications reviewed  Review of Systems Per HPI unless specifically indicated above     Objective:    BP 132/84   Pulse 87   Temp 97.6 F (36.4 C) (Oral)   Resp 14   Wt 215 lb (97.5 kg)   SpO2 97%   BMI 34.70 kg/m   Wt Readings from Last 3 Encounters:  12/20/16 215 lb (97.5 kg)  11/17/16 213 lb (96.6 kg)  08/19/16 220 lb (99.8 kg)    Physical Exam  Constitutional: She appears well-developed and well-nourished. No distress.  Eyes: EOM are normal. No scleral icterus.  Cardiovascular: Normal rate, regular rhythm and normal heart sounds.   No murmur heard. Pulmonary/Chest: Effort normal and breath sounds normal.  Abdominal: Soft. Bowel sounds are normal. She exhibits no distension.  Musculoskeletal: Normal range of motion. She exhibits no edema.  Neurological: She is alert.  Skin: Skin is warm and dry. She is not diaphoretic. No pallor.  Psychiatric: She has a normal mood and affect. Her behavior is normal. Judgment and thought content normal.      Assessment & Plan:   Problem List Items Addressed This Visit  Cardiovascular and Mediastinum   Essential hypertension, benign    Controlled today; try to follow DASH guidelines; avoid decongestants      Relevant Medications   triamterene-hydrochlorothiazide (MAXZIDE-25) 37.5-25 MG tablet     Nervous and Auditory   BPPV (benign paroxysmal positional vertigo)    Patient does well with diazepam PRN; she has been given my typical cautions about combining with alcohol, not driving, etc.; I also warned her about the Jul 08, 2015 FDA press release related to benzos plus opiates, and to never use benzo if she is given a narcotic for an injury or surgery or other reason in the future; she agrees        Other   Long term prescription benzodiazepine use    NCCS[place] web site reviewed; will see her every three months for diazepam prescriptions      HLD  (hyperlipidemia)    Patient wanted to try TLC previously; will check fasting lipids and see how she's done; limit saturated fats      Relevant Medications   triamterene-hydrochlorothiazide (MAXZIDE-25) 37.5-25 MG tablet   Bilateral edema of lower extremity    Doing well with triamterene-hctz      Allergic conjunctivitis and rhinitis    Patient cautioned to avoid decongestants if she uses OTC meds for allergic symptoms         Follow up plan: No Follow-up on file.  An after-visit summary was printed and given to the patient at check-out.  Please see the patient instructions which may contain other information and recommendations beyond what is mentioned above in the assessment and plan.  Meds ordered this encounter  Medications  . DISCONTD: diazepam (VALIUM) 5 MG tablet    Sig: Take 0.5-1 tablets (2.5-5 mg total) by mouth every 8 (eight) hours as needed (vertigo).    Dispense:  30 tablet    Refill:  1  . diazepam (VALIUM) 5 MG tablet    Sig: Take 0.5-1 tablets (2.5-5 mg total) by mouth every 8 (eight) hours as needed (vertigo).    Dispense:  30 tablet    Refill:  1  . triamterene-hydrochlorothiazide (MAXZIDE-25) 37.5-25 MG tablet    Sig: Take 1 tablet by mouth daily.    Dispense:  30 tablet    Refill:  5    No orders of the defined types were placed in this encounter.

## 2016-12-21 LAB — LIPID PANEL
Cholesterol: 249 mg/dL — ABNORMAL HIGH (ref <200)
HDL: 38 mg/dL — AB (ref >50)
LDL Cholesterol: 164 mg/dL — ABNORMAL HIGH (ref <100)
Total CHOL/HDL Ratio: 6.6 Ratio — ABNORMAL HIGH (ref <5.0)
Triglycerides: 234 mg/dL — ABNORMAL HIGH (ref <150)
VLDL: 47 mg/dL — ABNORMAL HIGH (ref <30)

## 2016-12-21 LAB — COMPLETE METABOLIC PANEL WITH GFR
ALBUMIN: 4.4 g/dL (ref 3.6–5.1)
ALK PHOS: 80 U/L (ref 33–130)
ALT: 41 U/L — ABNORMAL HIGH (ref 6–29)
AST: 28 U/L (ref 10–35)
BILIRUBIN TOTAL: 0.3 mg/dL (ref 0.2–1.2)
BUN: 13 mg/dL (ref 7–25)
CO2: 30 mmol/L (ref 20–31)
CREATININE: 1 mg/dL (ref 0.50–1.05)
Calcium: 9.5 mg/dL (ref 8.6–10.4)
Chloride: 100 mmol/L (ref 98–110)
GFR, EST NON AFRICAN AMERICAN: 62 mL/min (ref >=60)
GFR, Est African American: 71 mL/min (ref >=60)
GLUCOSE: 85 mg/dL (ref 65–99)
Potassium: 4.4 mmol/L (ref 3.5–5.3)
SODIUM: 139 mmol/L (ref 135–146)
TOTAL PROTEIN: 7.1 g/dL (ref 6.1–8.1)

## 2016-12-25 DIAGNOSIS — Z79899 Other long term (current) drug therapy: Secondary | ICD-10-CM | POA: Insufficient documentation

## 2016-12-25 DIAGNOSIS — H8110 Benign paroxysmal vertigo, unspecified ear: Secondary | ICD-10-CM | POA: Insufficient documentation

## 2016-12-25 HISTORY — DX: Benign paroxysmal vertigo, unspecified ear: H81.10

## 2016-12-25 NOTE — Assessment & Plan Note (Signed)
Patient does well with diazepam PRN; she has been given my typical cautions about combining with alcohol, not driving, etc.; I also warned her about the Jul 08, 2015 FDA press release related to benzos plus opiates, and to never use benzo if she is given a narcotic for an injury or surgery or other reason in the future; she agrees

## 2016-12-25 NOTE — Assessment & Plan Note (Signed)
Doing well with triamterene-hctz

## 2016-12-25 NOTE — Assessment & Plan Note (Signed)
Patient cautioned to avoid decongestants if she uses OTC meds for allergic symptoms

## 2016-12-25 NOTE — Assessment & Plan Note (Signed)
Controlled today; try to follow DASH guidelines; avoid decongestants

## 2016-12-25 NOTE — Assessment & Plan Note (Signed)
NCCS[place] web site reviewed; will see her every three months for diazepam prescriptions

## 2016-12-25 NOTE — Assessment & Plan Note (Signed)
Patient wanted to try TLC previously; will check fasting lipids and see how she's done; limit saturated fats

## 2017-02-10 ENCOUNTER — Inpatient Hospital Stay: Payer: BLUE CROSS/BLUE SHIELD

## 2017-02-17 ENCOUNTER — Inpatient Hospital Stay: Payer: BLUE CROSS/BLUE SHIELD | Admitting: Internal Medicine

## 2017-02-17 ENCOUNTER — Inpatient Hospital Stay: Payer: BLUE CROSS/BLUE SHIELD

## 2017-02-17 NOTE — Assessment & Plan Note (Deleted)
Microcytosis- MCV around 66; normal hemoglobin 13. Iron studies normal ferritin/saturation 22%- not suggestive of Iron deficiency. Hemoglobin electrophoresis- Normal. Patient is asymptomatic. At this time I would not recommend any further workup  # Recommend sending [place]- previous bloodwork results for comparison. Patient agrees. .  # follow up in 6 months/labs- iron studies/ldh/ cbc few days prior.

## 2017-02-17 NOTE — Progress Notes (Deleted)
[facility] CONSULT NOTE  Patient Care Team: Kerman Passey, MD as PCP - General (Family Medicine)  CHIEF COMPLAINTS/PURPOSE OF CONSULTATION: Microcytosis/hypochromia.   # MICROCYTOSIS/ Hb 13- MCV 66; hemoglobin electrophoresis-normal  No history exists.     HISTORY OF PRESENTING ILLNESS:  Vanessa Gross 61 y.o.  female is here to review the results of her blood work done for microcytosis.  Patient had been on by mouth iron prior to hysterectomy because of menorrhagia. Complains of brittle hair and brittle nails. Otherwise no weight loss. No difficulty swallowing or pain with swallowing. No blood in urine. Mild chronic fatigue. Not any worse.  ROS: A complete 10 point review of system is done which is negative except mentioned above in history of present illness  MEDICAL HISTORY:  Past Medical History:  Diagnosis Date  . Benign paroxysmal positional vertigo    bilateral  . Chronic radicular lumbar pain   . Hair loss   . HTN, goal below 140/90   . Hyperlipidemia LDL goal <100   . Obesity, Class I, BMI 30.0-34.9 (see actual BMI)   . 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     SURGICAL HISTORY: Past Surgical History:  Procedure Laterality Date  . ABDOMINAL HYSTERECTOMY    . BREAST BIOPSY Left    benign  . TONSILLECTOMY AND ADENOIDECTOMY      SOCIAL HISTORY: Social History   Social History  . Marital status: Married    Spouse name: N/A  . Number of children: N/A  . Years of education: N/A   Occupational History  . Not on file.   Social History Main Topics  . Smoking status: Never Smoker  . Smokeless tobacco: Never Used  . Alcohol use No  . Drug use: No  . Sexual activity: Not Currently   Other Topics Concern  . Not on file   Social History Narrative  . No narrative on file    FAMILY HISTORY: Family History  Problem Relation Age of Onset  . Hypertension Mother   . Cancer Mother     lpymphoma    ALLERGIES:   is allergic to celecoxib and gabapentin.  MEDICATIONS:  Current Outpatient Prescriptions  Medication Sig Dispense Refill  . Cholecalciferol (D3-1000) 1000 units tablet Take 1,000 Units by mouth daily.    . diazepam (VALIUM) 5 MG tablet Take 0.5-1 tablets (2.5-5 mg total) by mouth every 8 (eight) hours as needed (vertigo). 30 tablet 1  . Multiple Vitamin (MULTI-VITAMINS) TABS Take by mouth.    . triamterene-hydrochlorothiazide (MAXZIDE-25) 37.5-25 MG tablet Take 1 tablet by mouth daily. 30 tablet 5   No current facility-administered medications for this visit.       Marland Kitchen  PHYSICAL EXAMINATION: ECOG PERFORMANCE STATUS: 0 - Asymptomatic  There were no vitals filed for this visit. There were no vitals filed for this visit.  GENERAL: Well-nourished well-developed; Alert, no distress and comfortable.   With family EYES: no pallor or icterus OROPHARYNX: no thrush or ulceration; good dentition  NECK: supple, no masses felt LYMPH:  no palpable lymphadenopathy in the cervical, axillary or inguinal regions LUNGS: clear to auscultation and  No wheeze or crackles HEART/CVS: regular rate & rhythm and no murmurs; No lower extremity edema ABDOMEN: abdomen soft, non-tender and normal bowel sounds Musculoskeletal:no cyanosis of digits and no clubbing  PSYCH: alert & oriented x 3 with fluent speech; very anxious; tearful NEURO: no focal motor/sensory deficits SKIN:  no rashes or significant lesions  LABORATORY  DATA:  I have reviewed the data as listed Lab Results  Component Value Date   WBC 4.9 08/04/2016   HGB 14.2 08/04/2016   HCT 44.9 08/04/2016   MCV 66.5 (L) 08/04/2016   PLT 257 08/04/2016    Recent Labs  06/17/16 0908 12/20/16 1604  NA 142 139  K 4.5 4.4  CL 105 100  CO2 29 30  GLUCOSE 90 85  BUN 13 13  CREATININE 0.87 1.00  CALCIUM 9.5 9.5  GFRNONAA  --  62  GFRAA  --  71  PROT 6.5 7.1  ALBUMIN 4.0 4.4  AST 29 28  ALT 49* 41*  ALKPHOS 68 80  BILITOT 0.3 0.3     RADIOGRAPHIC STUDIES: I have personally reviewed the radiological images as listed and agreed with the findings in the report. No results found.  ASSESSMENT & PLAN:   No problem-specific Assessment & Plan notes found for this encounter.  All questions were answered. The patient knows to call the clinic with any problems, questions or concerns.     Earna Coder, MD 02/17/2017 8:06 AM

## 2017-02-24 ENCOUNTER — Inpatient Hospital Stay (HOSPITAL_BASED_OUTPATIENT_CLINIC_OR_DEPARTMENT_OTHER): Payer: BLUE CROSS/BLUE SHIELD | Admitting: Internal Medicine

## 2017-02-24 ENCOUNTER — Inpatient Hospital Stay: Payer: BLUE CROSS/BLUE SHIELD | Attending: Internal Medicine

## 2017-02-24 VITALS — BP 164/97 | HR 80 | Temp 97.8°F | Resp 18 | Ht 66.0 in | Wt 214.0 lb

## 2017-02-24 DIAGNOSIS — Z79899 Other long term (current) drug therapy: Secondary | ICD-10-CM | POA: Insufficient documentation

## 2017-02-24 DIAGNOSIS — E785 Hyperlipidemia, unspecified: Secondary | ICD-10-CM

## 2017-02-24 DIAGNOSIS — E669 Obesity, unspecified: Secondary | ICD-10-CM | POA: Insufficient documentation

## 2017-02-24 DIAGNOSIS — D509 Iron deficiency anemia, unspecified: Secondary | ICD-10-CM

## 2017-02-24 DIAGNOSIS — I10 Essential (primary) hypertension: Secondary | ICD-10-CM | POA: Insufficient documentation

## 2017-02-24 DIAGNOSIS — H8110 Benign paroxysmal vertigo, unspecified ear: Secondary | ICD-10-CM | POA: Insufficient documentation

## 2017-02-24 DIAGNOSIS — G8929 Other chronic pain: Secondary | ICD-10-CM | POA: Diagnosis not present

## 2017-02-24 DIAGNOSIS — D508 Other iron deficiency anemias: Secondary | ICD-10-CM

## 2017-02-24 DIAGNOSIS — M545 Low back pain: Secondary | ICD-10-CM

## 2017-02-24 DIAGNOSIS — R5382 Chronic fatigue, unspecified: Secondary | ICD-10-CM | POA: Insufficient documentation

## 2017-02-24 DIAGNOSIS — R718 Other abnormality of red blood cells: Secondary | ICD-10-CM

## 2017-02-24 LAB — CBC WITH DIFFERENTIAL/PLATELET
BASOS ABS: 0.1 10*3/uL (ref 0–0.1)
Basophils Relative: 2 %
EOS ABS: 0.1 10*3/uL (ref 0–0.7)
Eosinophils Relative: 1 %
HCT: 44 % (ref 35.0–47.0)
Hemoglobin: 14 g/dL (ref 12.0–16.0)
Lymphocytes Relative: 46 %
Lymphs Abs: 2.1 10*3/uL (ref 1.0–3.6)
MCH: 21 pg — AB (ref 26.0–34.0)
MCHC: 31.9 g/dL — ABNORMAL LOW (ref 32.0–36.0)
MCV: 65.9 fL — ABNORMAL LOW (ref 80.0–100.0)
Monocytes Absolute: 0.5 10*3/uL (ref 0.2–0.9)
Monocytes Relative: 10 %
Neutro Abs: 1.9 10*3/uL (ref 1.4–6.5)
Neutrophils Relative %: 41 %
PLATELETS: 275 10*3/uL (ref 150–440)
RBC: 6.67 MIL/uL — AB (ref 3.80–5.20)
RDW: 15.6 % — ABNORMAL HIGH (ref 11.5–14.5)
WBC: 4.7 10*3/uL (ref 3.6–11.0)

## 2017-02-24 LAB — IRON AND TIBC
Iron: 53 ug/dL (ref 28–170)
SATURATION RATIOS: 15 % (ref 10.4–31.8)
TIBC: 353 ug/dL (ref 250–450)
UIBC: 300 ug/dL

## 2017-02-24 LAB — LACTATE DEHYDROGENASE: LDH: 175 U/L (ref 98–192)

## 2017-02-24 LAB — FERRITIN: Ferritin: 134 ng/mL (ref 11–307)

## 2017-02-24 NOTE — Progress Notes (Signed)
[facility] CONSULT NOTE  Patient Care Team: Kerman Passey, MD as PCP - General (Family Medicine)  CHIEF COMPLAINTS/PURPOSE OF CONSULTATION: Microcytosis/hypochromia.   # MICROCYTOSIS/ Hb 13- MCV 66; hemoglobin electrophoresis-normal  No history exists.     HISTORY OF PRESENTING ILLNESS:  Vanessa Gross 61 y.o.  female is here For follow-up for history of microcytosis.  Patient denies any blood in stools or black stools. Otherwise no weight loss. No difficulty swallowing or pain with swallowing. Mild chronic fatigue is not any worse.  ROS: A complete 10 point review of system is done which is negative except mentioned above in history of present illness  MEDICAL HISTORY:  Past Medical History:  Diagnosis Date  . Benign paroxysmal positional vertigo    bilateral  . Chronic radicular lumbar pain   . Hair loss   . HTN, goal below 140/90   . Hyperlipidemia LDL goal <100   . Obesity, Class I, BMI 30.0-34.9 (see actual BMI)   . 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     SURGICAL HISTORY: Past Surgical History:  Procedure Laterality Date  . ABDOMINAL HYSTERECTOMY    . BREAST BIOPSY Left    benign  . TONSILLECTOMY AND ADENOIDECTOMY      SOCIAL HISTORY: Social History   Social History  . Marital status: Married    Spouse name: N/A  . Number of children: N/A  . Years of education: N/A   Occupational History  . Not on file.   Social History Main Topics  . Smoking status: Never Smoker  . Smokeless tobacco: Never Used  . Alcohol use No  . Drug use: No  . Sexual activity: Not Currently   Other Topics Concern  . Not on file   Social History Narrative  . No narrative on file    FAMILY HISTORY: Family History  Problem Relation Age of Onset  . Hypertension Mother   . Cancer Mother     lpymphoma    ALLERGIES:  is allergic to celecoxib and gabapentin.  MEDICATIONS:  Current Outpatient Prescriptions  Medication Sig  Dispense Refill  . BIOTIN PO Take 1 tablet by mouth daily.    . Cholecalciferol (D3-1000) 1000 units tablet Take 1,000 Units by mouth daily.    . diazepam (VALIUM) 5 MG tablet Take 0.5-1 tablets (2.5-5 mg total) by mouth every 8 (eight) hours as needed (vertigo). 30 tablet 1  . Multiple Vitamin (MULTI-VITAMINS) TABS Take by mouth.    . triamterene-hydrochlorothiazide (MAXZIDE-25) 37.5-25 MG tablet Take 1 tablet by mouth daily. 30 tablet 5   No current facility-administered medications for this visit.       Marland Kitchen  PHYSICAL EXAMINATION: ECOG PERFORMANCE STATUS: 0 - Asymptomatic  Vitals:   02/24/17 0845  BP: (!) 164/97  Pulse: 80  Resp: 18  Temp: 97.8 F (36.6 C)   Filed Weights   02/24/17 0845  Weight: 214 lb (97.1 kg)    GENERAL: Well-nourished well-developed; Alert, no distress and comfortable.  Alone. Anxious. EYES: no pallor or icterus OROPHARYNX: no thrush or ulceration; good dentition  NECK: supple, no masses felt LYMPH:  no palpable lymphadenopathy in the cervical, axillary or inguinal regions LUNGS: clear to auscultation and  No wheeze or crackles HEART/CVS: regular rate & rhythm and no murmurs; No lower extremity edema ABDOMEN: abdomen soft, non-tender and normal bowel sounds Musculoskeletal:no cyanosis of digits and no clubbing  PSYCH: alert & oriented x 3 with fluent speech; very anxious. NEURO: no  focal motor/sensory deficits SKIN:  no rashes or significant lesions  LABORATORY DATA:  I have reviewed the data as listed Lab Results  Component Value Date   WBC 4.7 02/24/2017   HGB 14.0 02/24/2017   HCT 44.0 02/24/2017   MCV 65.9 (L) 02/24/2017   PLT 275 02/24/2017    Recent Labs  06/17/16 0908 12/20/16 1604  NA 142 139  K 4.5 4.4  CL 105 100  CO2 29 30  GLUCOSE 90 85  BUN 13 13  CREATININE 0.87 1.00  CALCIUM 9.5 9.5  GFRNONAA  --  62  GFRAA  --  71  PROT 6.5 7.1  ALBUMIN 4.0 4.4  AST 29 28  ALT 49* 41*  ALKPHOS 68 80  BILITOT 0.3 0.3     RADIOGRAPHIC STUDIES: I have personally reviewed the radiological images as listed and agreed with the findings in the report. No results found.  ASSESSMENT & PLAN:   RBC microcytosis Microcytosis- MCV around 66; normal hemoglobin 13. Iron studies normal ferritin/saturation 22%- not suggestive of Iron deficiency. 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 Patient is asymptomatic. At this time I would not recommend any further workup  # Reassured the patient that this is likely benign process; No follow ups are necessary at this time. Patient agrees. She'll call us if any concerns.  CC: Dr.Lada.  All questions were answered. The patient knows to call the clinic with any problems, questions or concerns.     Earna Coder, MD 02/26/2017 6:24 PM

## 2017-02-24 NOTE — Assessment & Plan Note (Addendum)
Microcytosis- MCV around 66; normal hemoglobin 13. Iron studies normal ferritin/saturation 22%- not suggestive of Iron deficiency. 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 Patient is asymptomatic. At this time I would not recommend any further workup  # Reassured the patient that this is likely benign process; No follow ups are necessary at this time. Patient agrees. She'll call us if any concerns.  CC: Dr.Lada.

## 2017-02-24 NOTE — Progress Notes (Signed)
Patient here for follow-up for microcytosis. She has no medical complaints.

## 2017-05-04 ENCOUNTER — Other Ambulatory Visit: Payer: Self-pay

## 2017-05-04 NOTE — Telephone Encounter (Signed)
Patient states her ins is requesting her to get a 90 day supply, and she needs it before the 1st?  Can you fill and let amber know since I will not be here tomorrow.

## 2017-05-05 NOTE — Telephone Encounter (Signed)
Pt states that she had already picked up the 30 day supply of triamterene-hctz however her insurance company is changing on the 1st (this Sunday) and they asked her to ask her pcp if she could get a 90 day supply to last until it is completely changed over. Appointment have been scheduled for 05/16/17 with Dr Sherie DonLada.

## 2017-05-05 NOTE — Telephone Encounter (Signed)
Patient was supposed to follow up in 3 months (It's been 4, almost 5 months since last visit). Please call and ask her to schedule an appointment as soon as possible. I cannot provide full 90-day supplies until she is seen. I am happy to provide a 30-day supply to get her through until her appointment. Please apologize for any inconvenience. Thank you!

## 2017-05-09 MED ORDER — TRIAMTERENE-HCTZ 37.5-25 MG PO TABS: 1.0000 | ORAL_TABLET | Freq: Every day | ORAL | 0 refills | Status: DC

## 2017-05-16 ENCOUNTER — Ambulatory Visit (INDEPENDENT_AMBULATORY_CARE_PROVIDER_SITE_OTHER): Payer: No Typology Code available for payment source | Admitting: Family Medicine

## 2017-05-16 ENCOUNTER — Encounter: Payer: Self-pay | Admitting: Family Medicine

## 2017-05-16 VITALS — BP 138/92 | HR 82 | Temp 97.6°F | Resp 14 | Wt 211.9 lb

## 2017-05-16 DIAGNOSIS — Z79899 Other long term (current) drug therapy: Secondary | ICD-10-CM | POA: Diagnosis not present

## 2017-05-16 DIAGNOSIS — Z1231 Encounter for screening mammogram for malignant neoplasm of breast: Secondary | ICD-10-CM | POA: Diagnosis not present

## 2017-05-16 DIAGNOSIS — I10 Essential (primary) hypertension: Secondary | ICD-10-CM

## 2017-05-16 DIAGNOSIS — H8110 Benign paroxysmal vertigo, unspecified ear: Secondary | ICD-10-CM | POA: Diagnosis not present

## 2017-05-16 DIAGNOSIS — Z1239 Encounter for other screening for malignant neoplasm of breast: Secondary | ICD-10-CM

## 2017-05-16 HISTORY — DX: Other long term (current) drug therapy: Z79.899

## 2017-05-16 MED ORDER — DIAZEPAM 5 MG PO TABS: 2.5000 mg | ORAL_TABLET | Freq: Three times a day (TID) | ORAL | 2 refills | Status: DC | PRN

## 2017-05-16 NOTE — Progress Notes (Signed)
BP (!) 138/92   Pulse 82   Temp 97.6 F (36.4 C) (Oral)   Resp 14   Wt 211 lb 14.4 oz (96.1 kg)   SpO2 94%   BMI 34.20 kg/m    Subjective:    Patient ID: Vanessa Gross, female    DOB: 06/04/1956, 61 y.o.   MRN: [id number]  HPI: Vanessa Gross is a 60 y.o. female  Chief Complaint  Patient presents with  . Medication Refill    HPI Patient is here for refills of her medicine She takes valium NCCS[place] web site reviewed; last fill was for thirty pills on 03/13/2017 That is working well; taking half of pill at a time; uses about one pill a week She uses it once or twice a week for vertigo, half of a pill at a time Took her last one on vacation 4-5 days ago She uses one pill a week on average She is sure no one is taking her pills  BP is up; stress at home; she is safe Not checking BP at home; high at the dentist; worried about the bill Not eating much salt; does not add salt at the table; she really cut back on the amount of salt she has used No salty snacks Allergies; no decongestants On the maxzide, last kidney function was excellent  Depression screen [facility] 2/9 05/16/2017 12/20/2016 11/17/2016 06/07/2016 10/19/2015  Decreased Interest 0 0 0 0 0  Down, Depressed, Hopeless 0 0 0 0 0  PHQ - 2 Score 0 0 0 0 0    Relevant past medical, surgical, family and social history reviewed Past Medical History:  Diagnosis Date  . Benign paroxysmal positional vertigo    bilateral  . Chronic radicular lumbar pain   . Controlled substance agreement signed 05/16/2017   May 16, 2017  . Hair loss   . HTN, goal below 140/90   . Hyperlipidemia LDL goal <100   . Obesity, Class I, BMI 30.0-34.9 (see actual BMI)   . 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 ABDOMINAL HYSTERECTOMY    . BREAST BIOPSY Left    benign  . TONSILLECTOMY AND ADENOIDECTOMY     Family History  Problem Relation Age of Onset  .  Hypertension Mother   . Cancer Mother        lpymphoma  . Stroke Maternal Grandmother   . Stroke Maternal Grandfather    Social History   Social History  . Marital status: Married    Spouse name: N/A  . Number of children: N/A  . Years of education: N/A   Occupational History  . Not on file.   Social History Main Topics  . Smoking status: Never Smoker  . Smokeless tobacco: Never Used  . Alcohol use No  . Drug use: No  . Sexual activity: Not Currently   Other Topics Concern  . Not on file   Social History Narrative  . No narrative on file    Interim medical history since last visit reviewed. Allergies and medications reviewed  Review of Systems Per HPI unless specifically indicated above     Objective:    BP (!) 138/92   Pulse 82   Temp 97.6 F (36.4 C) (Oral)   Resp 14   Wt 211 lb 14.4 oz (96.1 kg)   SpO2 94%   BMI 34.20 kg/m   Wt Readings from Last 3 Encounters:  05/16/17 211 lb 14.4 oz (96.1 kg)  02/24/17 214 lb (97.1 kg)  12/20/16 215 lb (97.5 kg)    Physical Exam  Constitutional: She appears well-developed and well-nourished.  HENT:  Mouth/Throat: Mucous membranes are normal.  Eyes: EOM are normal. No scleral icterus.  Cardiovascular: Normal rate and regular rhythm.   Pulmonary/Chest: Effort normal and breath sounds normal.  Musculoskeletal: She exhibits no edema.  Psychiatric: She has a normal mood and affect. Her behavior is normal. Her mood appears not anxious. She does not exhibit a depressed mood.       Assessment & Plan:   Problem List Items Addressed This Visit      Cardiovascular and Mediastinum   Essential hypertension, benign    Not controlled today; patient agrees to check her blood pressure a few times a week and notify me if not to goal; see AVS; discussed low salt diet, avoid decongestants, typed out goal BP for her; she agrees to call if not controlled and med adjustment needed        Nervous and Auditory   BPPV (benign  paroxysmal positional vertigo) - Primary    Patient reports using valium, half of a tablet per dose, about twice a week; willing to refill benzo; no alcohol listed in her social hx per CMA        Other   Controlled substance agreement signed    Controlled substance agreement explained, to safeguard her and Vanessa Gross; will get UDS today; explained need to verify presence of drug in system, though she reports last dose was 4-5 days ago; reviewed [place]CCS[place] web site today; 30 pills prescribed in May; if only using sparingly, I'll decrease the amount given at a time, with 12 pills to last at least 30 days; will see her every 3 months       Other Visit Diagnoses    Screening for breast cancer       Relevant Orders   MM Digital Screening       Follow up plan: Return in about 3 months (around 08/16/2017) for follow-up visit with Vanessa Gross.  An after-visit summary was printed and given to the patient at check-out.  Please see the patient instructions which may contain other information and recommendations beyond what is mentioned above in the assessment and plan.  Meds ordered this encounter  Medications  . naproxen sodium (ANAPROX) 220 MG tablet    Sig: Take 220 mg by mouth as needed.  . diazepam (VALIUM) 5 MG tablet    Sig: Take 0.5-1 tablets (2.5-5 mg total) by mouth every 8 (eight) hours as needed (vertigo).    Dispense:  12 tablet    Refill:  2    Twelve pills should last at least 30 days    Orders Placed This Encounter  Procedures  . MM Digital Screening

## 2017-05-16 NOTE — Assessment & Plan Note (Signed)
Patient reports using valium, half of a tablet per dose, about twice a week; willing to refill benzo; no alcohol listed in her social hx per CMA

## 2017-05-16 NOTE — Assessment & Plan Note (Signed)
Controlled substance agreement explained, to safeguard her and us; will get UDS today; explained need to verify presence of drug in system, though she reports last dose was 4-5 days ago; reviewed [place]CCS[place] web site today; 30 pills prescribed in May; if only using sparingly, I'll decrease the amount given at a time, with 12 pills to last at least 30 days; will see her every 3 months

## 2017-05-16 NOTE — Assessment & Plan Note (Signed)
Not controlled today; patient agrees to check her blood pressure a few times a week and notify me if not to goal; see AVS; discussed low salt diet, avoid decongestants, typed out goal BP for her; she agrees to call if not controlled and med adjustment needed

## 2017-05-16 NOTE — Patient Instructions (Signed)
Your goal blood pressure is less than 140 mmHg on top, less than 90 on the bottom Try to follow the DASH guidelines (DASH stands for Dietary Approaches to Stop Hypertension) Try to limit the sodium in your diet.  Ideally, consume less than 1.5 grams (less than 1,500mg ) per day. Do not add salt when cooking or at the table.  Check the sodium amount on labels when shopping, and choose items lower in sodium when given a choice. Avoid or limit foods that already contain a lot of sodium. Eat a diet rich in fruits and vegetables and whole grains. Monitor a few times a week and call me if not controlled

## 2017-05-22 ENCOUNTER — Encounter: Payer: Self-pay | Admitting: Family Medicine

## 2017-05-26 ENCOUNTER — Telehealth: Payer: Self-pay | Admitting: Family Medicine

## 2017-05-26 DIAGNOSIS — Z9114 Patient's other noncompliance with medication regimen: Secondary | ICD-10-CM | POA: Insufficient documentation

## 2017-05-26 DIAGNOSIS — Z91148 Patient's other noncompliance with medication regimen for other reason: Secondary | ICD-10-CM | POA: Insufficient documentation

## 2017-05-26 NOTE — Telephone Encounter (Signed)
I returned her call She has a problem She wants the paper back that she signed She does not want that on her file She wants to cancel the contract She has only taken one pill she says She would like to bring the rest of the pills back on Monday and VOID out the contract That's fine with me ---------------------------------------- I am removing the diazepam from her med list I called [facility] to cancel the remaining two refills; she is not getting them there; last they had was from Dr. Sherley BoundsSundaram I [facility]; [facility]ing last two prescriptions; spoke with Caryn BeeKevin

## 2017-05-26 NOTE — Telephone Encounter (Signed)
Pt called asking for Dr Sherie DonLada to call her directly. Pt would not share what this is about, only that its personal. Her home # is best and she will be there after 3:30 today.

## 2017-05-26 NOTE — Telephone Encounter (Signed)
Pt called a second time asking to speak directly to Dr. Sherie DonLada. Pt would not share anything with me she states its personal and would like for Dr. lada to call her before she leave.  She asked for you to call home number [phone]

## 2017-06-23 ENCOUNTER — Other Ambulatory Visit: Payer: Self-pay | Admitting: Family Medicine

## 2017-06-23 NOTE — Telephone Encounter (Signed)
I received a request for Maxzide (too soon)

## 2017-08-17 ENCOUNTER — Ambulatory Visit: Payer: No Typology Code available for payment source | Admitting: Family Medicine

## 2017-09-01 ENCOUNTER — Ambulatory Visit: Payer: No Typology Code available for payment source | Admitting: Family Medicine

## 2017-09-20 ENCOUNTER — Telehealth: Payer: Self-pay | Admitting: Family Medicine

## 2017-09-20 NOTE — Telephone Encounter (Signed)
Please ask patient to schedule an appointment She canceled one appt and no showed for another appt in October I'll refill her medicine and we look forward to seeing her soon

## 2017-09-20 NOTE — Telephone Encounter (Signed)
Tried calling pt but was unable to LVM

## 2017-10-19 ENCOUNTER — Telehealth: Payer: Self-pay | Admitting: Family Medicine

## 2017-10-19 NOTE — Telephone Encounter (Signed)
Copied from CRM [phone]. Topic: General - Other >> Oct 19, 2017  3:31 PM Stephannie LiSimmons, Shaka Cardin L, NT wrote: Reason for CRM: Patient says the blood pressure pill is causing much fluid from her , she says she picked  up her 90 day supply and would like added to this,per her conversation with Dr Sherie DonLada please call [phone]

## 2017-10-20 NOTE — Telephone Encounter (Signed)
Called pt no answer. LM for pt informing her of the need for an appt as she was last seen in July and was supposed to have a 3 month follow up. CRM created.

## 2017-12-15 ENCOUNTER — Other Ambulatory Visit: Payer: Self-pay

## 2017-12-15 DIAGNOSIS — I10 Essential (primary) hypertension: Secondary | ICD-10-CM

## 2017-12-15 MED ORDER — TRIAMTERENE-HCTZ 37.5-25 MG PO TABS: 1.0000 | ORAL_TABLET | Freq: Every day | ORAL | 0 refills | Status: DC

## 2017-12-15 NOTE — Telephone Encounter (Signed)
Pt not seen since 05/16/2017.

## 2017-12-15 NOTE — Telephone Encounter (Signed)
Appointment please I'll provide refill and we look forward to seeing her soon

## 2017-12-18 NOTE — Telephone Encounter (Signed)
LVM for pt to call and schedule an appt °

## 2017-12-19 ENCOUNTER — Other Ambulatory Visit: Payer: Self-pay

## 2017-12-19 ENCOUNTER — Other Ambulatory Visit: Payer: Self-pay | Admitting: Family Medicine

## 2017-12-19 DIAGNOSIS — I10 Essential (primary) hypertension: Secondary | ICD-10-CM

## 2017-12-19 NOTE — Telephone Encounter (Signed)
Ins requesting 90 days

## 2017-12-20 NOTE — Telephone Encounter (Signed)
[facility] will [facility], with note on both the sig and to pharmacist, appointment and labs for further refills

## 2017-12-23 NOTE — Progress Notes (Signed)
UDS Already wet signed by me

## 2017-12-23 NOTE — Progress Notes (Signed)
Closing out lab/order note open since:  2017 

## 2017-12-23 NOTE — Telephone Encounter (Signed)
Staff has left a message Refills already sent on 12/15/17 and 12/20/17 with notes asking for appt I'm am therefore closing out this note

## 2017-12-23 NOTE — Progress Notes (Signed)
Signed off on old lab order document

## 2017-12-23 NOTE — Telephone Encounter (Signed)
Patient still does not have an appointment scheduled Please contact patient I am closing this note since it's been open so long and sending a staff message Patient needs to be seen please

## 2017-12-25 NOTE — Telephone Encounter (Signed)
Tried calling pt to schedule an appt but VM is not setup

## 2018-01-09 ENCOUNTER — Telehealth: Payer: Self-pay | Admitting: Family Medicine

## 2018-01-09 NOTE — Telephone Encounter (Signed)
Pt has appt scheduled for 01/12/18.

## 2018-01-09 NOTE — Telephone Encounter (Signed)
Copied from CRM [phone]. Topic: Quick Communication - Rx Refill/Question >> Jan 09, 2018  2:13 PM Vanessa Gross, Vanessa Gross wrote: Medication: triamterene-hydrochlorothiazide (MAXZIDE-25) 37.5-25 MG tablet   Has the patient contacted their pharmacy? Yes   (Agent: If no, request that the patient contact the pharmacy for the refill.)   Preferred Pharmacy (with phone number or street name): [id number] [place] [place] - [address]   Agent: Please be advised that RX refills may take up to 3 business days. We ask that you follow-up with your pharmacy.

## 2018-01-12 ENCOUNTER — Encounter: Payer: Self-pay | Admitting: Family Medicine

## 2018-01-12 ENCOUNTER — Ambulatory Visit: Payer: No Typology Code available for payment source | Admitting: Family Medicine

## 2018-01-12 DIAGNOSIS — R7401 Elevation of levels of liver transaminase levels: Secondary | ICD-10-CM

## 2018-01-12 DIAGNOSIS — R740 Nonspecific elevation of levels of transaminase and lactic acid dehydrogenase [LDH]: Secondary | ICD-10-CM | POA: Diagnosis not present

## 2018-01-12 DIAGNOSIS — E782 Mixed hyperlipidemia: Secondary | ICD-10-CM | POA: Diagnosis not present

## 2018-01-12 DIAGNOSIS — Z5181 Encounter for therapeutic drug level monitoring: Secondary | ICD-10-CM

## 2018-01-12 DIAGNOSIS — I10 Essential (primary) hypertension: Secondary | ICD-10-CM | POA: Diagnosis not present

## 2018-01-12 MED ORDER — TRIAMTERENE-HCTZ 37.5-25 MG PO TABS
1.0000 | ORAL_TABLET | Freq: Every day | ORAL | 3 refills | Status: DC
Start: 2018-01-12 — End: 2018-07-20

## 2018-01-12 MED ORDER — AMLODIPINE BESYLATE 2.5 MG PO TABS
2.5000 mg | ORAL_TABLET | Freq: Every day | ORAL | 3 refills | Status: DC
Start: 2018-01-12 — End: 2018-07-20

## 2018-01-12 NOTE — Progress Notes (Signed)
BP 136/78   Pulse 90   Temp 98 F (36.7 C) (Oral)   Resp 14   Wt 207 lb 11.2 oz (94.2 kg)   BMI 33.52 kg/m    Subjective:    Patient ID: Vanessa Gross, female    DOB: Oct 01, 1956, 62 y.o.   MRN: [id number]  HPI: Vanessa Gross is a 62 y.o. female  Chief Complaint  Patient presents with  . Medication Refill    90 days supply  . Hypertension    HPI Patient is here for f/u Hypertension; not adding salt to food Checks sometimes at pharmacy; never really high  High cholesterol Mother is on cholesterol medicine Patient does not want to be on cholesterol medicine Does not eat cheese Has a little bacon; no bologna, avoids pork wieners; just all beef wieners; tries to not eat fried foods, may   Obesity; losing weight on purpose; eating oatmeal and fruit  Depression screen [facility] 2/9 01/12/2018 05/16/2017 12/20/2016 11/17/2016 06/07/2016  Decreased Interest 0 0 0 0 0  Down, Depressed, Hopeless 1 0 0 0 0  PHQ - 2 Score 1 0 0 0 0    Relevant past medical, surgical, family and social history reviewed Past Medical History:  Diagnosis Date  . Benign paroxysmal positional vertigo    bilateral  . Chronic radicular lumbar pain   . Controlled substance agreement signed 05/16/2017   May 16, 2017  . Hair loss   . HTN, goal below 140/90   . Hyperlipidemia LDL goal <100   . Obesity, Class I, BMI 30.0-34.9 (see actual BMI)   . 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 ABDOMINAL HYSTERECTOMY    . BREAST BIOPSY Left    benign  . TONSILLECTOMY AND ADENOIDECTOMY     Family History  Problem Relation Age of Onset  . Hypertension Mother   . Cancer Mother        lpymphoma  . Stroke Maternal Grandmother   . Stroke Maternal Grandfather    Social History   Tobacco Use  . Smoking status: Never Smoker  . Smokeless tobacco: Never Used  Substance Use Topics  . Alcohol use: No    Alcohol/week: 0.0 oz  .  Drug use: No    Interim medical history since last visit reviewed. Allergies and medications reviewed  Review of Systems Per HPI unless specifically indicated above     Objective:    BP 136/78   Pulse 90   Temp 98 F (36.7 C) (Oral)   Resp 14   Wt 207 lb 11.2 oz (94.2 kg)   BMI 33.52 kg/m   Wt Readings from Last 3 Encounters:  01/12/18 207 lb 11.2 oz (94.2 kg)  05/16/17 211 lb 14.4 oz (96.1 kg)  02/24/17 214 lb (97.1 kg)    Physical Exam  Constitutional: She appears well-developed and well-nourished.  HENT:  Mouth/Throat: Mucous membranes are normal.  Eyes: EOM are normal. No scleral icterus.  Cardiovascular: Normal rate and regular rhythm.  Pulmonary/Chest: Effort normal and breath sounds normal.  Musculoskeletal: She exhibits no edema.  Psychiatric: She has a normal mood and affect. Her behavior is normal. Her mood appears not anxious. She does not exhibit a depressed mood.        Assessment & Plan:   Problem List Items Addressed This Visit      Cardiovascular and Mediastinum   Essential hypertension, benign  Try to limit salt and pork products; add a little bit CCB; continue the other blood pressure medicine; check kidneys      Relevant Medications   amLODipine (NORVASC) 2.5 MG tablet   triamterene-hydrochlorothiazide (MAXZIDE-25) 37.5-25 MG tablet     Other   Medication monitoring encounter    Check CMP      Relevant Orders   COMPLETE METABOLIC PANEL WITH GFR (Completed)   HLD (hyperlipidemia)    Check lipids today; advised checking labels for fat content      Relevant Medications   amLODipine (NORVASC) 2.5 MG tablet   triamterene-hydrochlorothiazide (MAXZIDE-25) 37.5-25 MG tablet   Other Relevant Orders   Lipid panel (Completed)   Elevated alanine aminotransferase (ALT) level    Check level today; no alcohol, not excessive tylenol          Follow up plan: Return in about 1 year (around 01/13/2019) for follow-up visit with Dr. Sherie DonLada.  An  after-visit summary was printed and given to the patient at check-out.  Please see the patient instructions which may contain other information and recommendations beyond what is mentioned above in the assessment and plan.  Meds ordered this encounter  Medications  . amLODipine (NORVASC) 2.5 MG tablet    Sig: Take 1 tablet (2.5 mg total) by mouth daily.    Dispense:  90 tablet    Refill:  3  . triamterene-hydrochlorothiazide (MAXZIDE-25) 37.5-25 MG tablet    Sig: Take 1 tablet by mouth daily. Appointment and labs soon please    Dispense:  90 tablet    Refill:  3    appt please    Orders Placed This Encounter  Procedures  . COMPLETE METABOLIC PANEL WITH GFR  . Lipid panel

## 2018-01-12 NOTE — Assessment & Plan Note (Signed)
Check CMP.  ?

## 2018-01-12 NOTE — Assessment & Plan Note (Signed)
Check level today; no alcohol, not excessive tylenol

## 2018-01-12 NOTE — Assessment & Plan Note (Signed)
Try to limit salt and pork products; add a little bit CCB; continue the other blood pressure medicine; check kidneys

## 2018-01-12 NOTE — Patient Instructions (Addendum)
  Try to use PLAIN allergy medicine without the decongestant Avoid: phenylephrine, phenylpropanolamine, and pseudoephredine Try to follow the DASH guidelines (DASH stands for Dietary Approaches to Stop Hypertension). Try to limit the sodium in your diet to no more than 1,500mg  of sodium per day. Certainly try to not exceed 2,000 mg per day at the very most. Do not add salt when cooking or at the table.  Check the sodium amount on labels when shopping, and choose items lower in sodium when given a choice. Avoid or limit foods that already contain a lot of sodium. Eat a diet rich in fruits and vegetables and whole grains, and try to lose weight if overweight or obese  Try to limit saturated fats in your diet (bologna, hot dogs, barbeque, cheeseburgers, hamburgers, steak, bacon, sausage, cheese, etc.) and get more fresh fruits, vegetables, and whole grains  Check your blood pressure a few times over the next few weeks and write to me through MyChart or call

## 2018-01-12 NOTE — Assessment & Plan Note (Signed)
Check lipids today; advised checking labels for fat content

## 2018-01-13 LAB — COMPLETE METABOLIC PANEL WITH GFR
AG Ratio: 1.6 (calc) (ref 1.0–2.5)
ALBUMIN MSPROF: 4.6 g/dL (ref 3.6–5.1)
ALKALINE PHOSPHATASE (APISO): 79 U/L (ref 33–130)
ALT: 25 U/L (ref 6–29)
AST: 20 U/L (ref 10–35)
BILIRUBIN TOTAL: 0.3 mg/dL (ref 0.2–1.2)
BUN: 13 mg/dL (ref 7–25)
CHLORIDE: 102 mmol/L (ref 98–110)
CO2: 30 mmol/L (ref 20–32)
CREATININE: 0.9 mg/dL (ref 0.50–0.99)
Calcium: 10 mg/dL (ref 8.6–10.4)
GFR, EST AFRICAN AMERICAN: 81 mL/min/{1.73_m2} (ref >=60)
GFR, Est Non African American: 69 mL/min/{1.73_m2} (ref >=60)
Globulin: 2.8 g/dL (calc) (ref 1.9–3.7)
Glucose, Bld: 83 mg/dL (ref 65–139)
Potassium: 4.6 mmol/L (ref 3.5–5.3)
Sodium: 142 mmol/L (ref 135–146)
TOTAL PROTEIN: 7.4 g/dL (ref 6.1–8.1)

## 2018-01-13 LAB — LIPID PANEL
CHOL/HDL RATIO: 6.7 (calc) — AB (ref <5.0)
CHOLESTEROL: 280 mg/dL — AB (ref <200)
HDL: 42 mg/dL — ABNORMAL LOW (ref >50)
LDL CHOLESTEROL (CALC): 205 mg/dL — AB
Non-HDL Cholesterol (Calc): 238 mg/dL (calc) — ABNORMAL HIGH (ref <130)
Triglycerides: 164 mg/dL — ABNORMAL HIGH (ref <150)

## 2018-01-16 ENCOUNTER — Telehealth: Payer: Self-pay

## 2018-01-16 NOTE — Telephone Encounter (Signed)
Called pt no answer. LM for pt informing her of the need to call back for lab results. CRM created. [facility]

## 2018-01-16 NOTE — Telephone Encounter (Signed)
-----   Message from Kerman PasseyMelinda P Lada, MD sent at 01/15/2018  5:58 PM EDT ----- Please let pt know that her glucose is normal. Her kidney function and liver function tests are all in the expected ranges. Her cholesterol is very high. I respect that she does not want to be on a statin. I really do want her to take something though, as the high LDL over time can lead to plaque which can cause a stroke or heart attack. If she wants to read about some other options, we welcome her to do so: Zetia, Welchol, Colestid, or Questran can be used, and they are not statins. If she just wants me to pick one, I'll be glad to do so. We really want to help her get this LDL down before plaque build-up causes a stroke or heart attack. With her cholesterol as high as it is, we'll also recommend that she start a baby 81 mg coated aspirin daily to help reduce stroke and heart attack risk. Thank you

## 2018-01-18 NOTE — Telephone Encounter (Signed)
Called pt no answer. Unable to leave message as no voicemail is set up.  

## 2018-01-23 NOTE — Telephone Encounter (Signed)
Mailed pt letter

## 2018-01-29 ENCOUNTER — Telehealth: Payer: Self-pay

## 2018-01-29 DIAGNOSIS — E7801 Familial hypercholesterolemia: Secondary | ICD-10-CM

## 2018-01-29 NOTE — Telephone Encounter (Signed)
Copied from CRM [id number]. Topic: Quick Communication - Lab Results >> Jan 29, 2018 11:37 AM Crist InfanteHarrald, Kathy J wrote: Pt would like a call back concerning letter asking her to call about lab results. Please call after 3:30 pm today   Called number listed, pt's husband answers states that pt is not currently home, asks that I call back after 3:30pm.

## 2018-01-30 NOTE — Telephone Encounter (Signed)
Pt called back, would like for you to please call her to discuss cholesterol meds. She does not get off of work everyday until 3:30.

## 2018-02-01 ENCOUNTER — Encounter: Payer: Self-pay | Admitting: Family Medicine

## 2018-02-01 MED ORDER — EZETIMIBE 10 MG PO TABS: 10.0000 mg | ORAL_TABLET | Freq: Every day | ORAL | 3 refills | Status: DC

## 2018-02-01 NOTE — Telephone Encounter (Signed)
I spoke with patient We need to correct her phone number; message sent to the front staff to remove the [phone] She looked online and she is not interested in statin, read about dementia, not going to take one I told her about zetia, explained that high LDL can lead to heart attack or stroke down the road She asked about B12 if that would help; she read about some man in [place] who was trying something for cholesterol, some name she never heard about; he got on that medicine and it changed, from [place]; she can't find the paper she wrote it on; C-cholesterol something, some all natural thing, and if you eat by that diet, everything come in to play she says; I told her I don't know what that is I encouraged her to read about the Zetia and then pick it up at the pharmacy and give it a try She would like a list of foods that she can eat and what she should avoid I'll mail her a list, but a good/easy rule of thumb is to avoid anything that comes from a cow or pig, and I name off the list (bacon, sausage, cheese, ice cream, hamburgers, etc.) Return in 2 months for lab draw to recheck chol on new medicine

## 2018-07-20 ENCOUNTER — Ambulatory Visit: Payer: No Typology Code available for payment source | Admitting: Nurse Practitioner

## 2018-07-20 ENCOUNTER — Encounter: Payer: Self-pay | Admitting: Nurse Practitioner

## 2018-07-20 VITALS — BP 140/90 | HR 76 | Temp 97.6°F | Resp 16 | Ht 66.0 in | Wt 201.3 lb

## 2018-07-20 DIAGNOSIS — I10 Essential (primary) hypertension: Secondary | ICD-10-CM | POA: Diagnosis not present

## 2018-07-20 DIAGNOSIS — Z23 Encounter for immunization: Secondary | ICD-10-CM | POA: Diagnosis not present

## 2018-07-20 DIAGNOSIS — Z114 Encounter for screening for human immunodeficiency virus [HIV]: Secondary | ICD-10-CM

## 2018-07-20 DIAGNOSIS — Z5181 Encounter for therapeutic drug level monitoring: Secondary | ICD-10-CM

## 2018-07-20 DIAGNOSIS — E669 Obesity, unspecified: Secondary | ICD-10-CM

## 2018-07-20 DIAGNOSIS — Z1239 Encounter for other screening for malignant neoplasm of breast: Secondary | ICD-10-CM

## 2018-07-20 DIAGNOSIS — E782 Mixed hyperlipidemia: Secondary | ICD-10-CM | POA: Diagnosis not present

## 2018-07-20 DIAGNOSIS — Z1231 Encounter for screening mammogram for malignant neoplasm of breast: Secondary | ICD-10-CM

## 2018-07-20 MED ORDER — AMLODIPINE BESYLATE 2.5 MG PO TABS: 2.5000 mg | ORAL_TABLET | Freq: Every day | ORAL | 1 refills | Status: DC

## 2018-07-20 MED ORDER — TRIAMTERENE-HCTZ 37.5-25 MG PO TABS: 1.0000 | ORAL_TABLET | Freq: Every day | ORAL | 1 refills | Status: DC

## 2018-07-20 NOTE — Progress Notes (Signed)
Name: RHYAN WOLTERS   MRN: [id number]    DOB: [birth year]   Date:07/20/2018       Progress Note  Subjective  Chief Complaint  Chief Complaint  Patient presents with  . Hypertension    Patient would like to discuss bp meds.    HPI  Hypertension Rx. Amlodipine 2.5mg , triamterene-HCTZ 37.5-25mg  daily. Marked as not taking maxzide; but states she is taking 2 medicines for her blood pressure.  Notes that she has increased stress at job and home recently. Low-salt diet; no added salt and avoids salty foods; eats lots of fruits and vegetables. Minimizes red meats.  Discussed AAFP Hypertension guidelines.  BP Readings from Last 3 Encounters:  07/20/18 140/90  01/12/18 136/78  05/16/17 (!) 138/92    Hyperlipidemia Rx on zetia in March but patient is not taking it. States never heard anything good about cholesterol medications.  Lab Results  Component Value Date   CHOL 280 (H) 01/12/2018   HDL 42 (L) 01/12/2018   LDLCALC 205 (H) 01/12/2018   TRIG 164 (H) 01/12/2018   CHOLHDL 6.7 (H) 01/12/2018   The 10-year ASCVD risk score Denman George DC Jr., et al., 2013) is: 14.2%   Values used to calculate the score:     Age: 62 years     Sex: Female     Is Non-Hispanic African American: Yes     Diabetic: No     Tobacco smoker: No     Systolic Blood Pressure: 140 mmHg     Is BP treated: Yes     HDL Cholesterol: 42 mg/dL     Total Cholesterol: 280 mg/dL   Patient endorses right wrist pain that is intermittent x 1 year; state occasionally gets swollen, not present today.   Patient ate oatmeal this morning around 9am.  Obesity: still losing weight; eating better and trying to be more active- states cut down on activity some due to being afraid of bugs.  Wt Readings from Last 3 Encounters:  07/20/18 201 lb 4.8 oz (91.3 kg)  01/12/18 207 lb 11.2 oz (94.2 kg)  05/16/17 211 lb 14.4 oz (96.1 kg)    Patient Active Problem List   Diagnosis Date Noted  . Controlled substance agreement  terminated 05/26/2017  . BPPV (benign paroxysmal positional vertigo) 12/25/2016  . Elevated alanine aminotransferase (ALT) level 06/17/2016  . 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 Obesity 06/07/2016  . Medication monitoring encounter 06/07/2016  . Allergic conjunctivitis and rhinitis 10/19/2015  . Shortness of breath dyspnea 06/30/2015  . Anxiety 05/29/2015  . Reflux esophagitis 05/29/2015  . Alopecia 05/29/2015  . Hemorrhoids 05/29/2015  . HLD (hyperlipidemia) 05/29/2015  . Insomnia 05/29/2015  . Menopausal and perimenopausal disorder 05/29/2015  . Surgical or procedure not carried out because of patient's decision 05/29/2015  . Pain and swelling of left ankle 04/15/2015  . Nerve root pain 11/26/2009  . Bilateral edema of lower extremity 12/01/2008  . Essential hypertension, benign 05/25/2007  . 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 Benign paroxysmal positional vertigo    bilateral  . Chronic radicular lumbar pain   . Controlled substance agreement signed 05/16/2017   May 16, 2017  . Hair loss   . HTN, goal below 140/90   . Hyperlipidemia LDL goal <100   . Obesity, Class I, BMI 30.0-34.9 (see actual BMI)   . Varicose veins of lower extremities without ulcer or  inflammation     Past Surgical History:  Procedure Laterality Date  . ABDOMINAL HYSTERECTOMY    . BREAST BIOPSY Left    benign  . TONSILLECTOMY AND ADENOIDECTOMY      Social History   Tobacco Use  . Smoking status: Never Smoker  . Smokeless tobacco: Never Used  Substance Use Topics  . Alcohol use: No    Alcohol/week: 0.0 standard drinks     Current Outpatient Medications:  .  amLODipine (NORVASC) 2.5 MG tablet, Take 1 tablet (2.5 mg total) by mouth daily., Disp: 90 tablet, Rfl: 3 .  BIOTIN PO, Take 1 tablet by mouth daily., Disp: , Rfl:  .  Cholecalciferol (D3-1000) 1000 units tablet,  Take 1,000 Units by mouth daily., Disp: , Rfl:  .  Multiple Vitamin (MULTI-VITAMINS) TABS, Take by mouth., Disp: , Rfl:  .  naproxen sodium (ANAPROX) 220 MG tablet, Take 220 mg by mouth as needed., Disp: , Rfl:  .  ezetimibe (ZETIA) 10 MG tablet, Take 1 tablet (10 mg total) by mouth daily. For cholesterol (Patient not taking: Reported on 07/20/2018), Disp: 90 tablet, Rfl: 3 .  triamterene-hydrochlorothiazide (MAXZIDE-25) 37.5-25 MG tablet, Take 1 tablet by mouth daily. Appointment and labs soon please (Patient not taking: Reported on 07/20/2018), Disp: 90 tablet, Rfl: 3  Allergies  Allergen Reactions  . Celecoxib   . Gabapentin     abnormal dreams    Review of Systems  Constitutional: Negative for chills, fever and malaise/fatigue.  Eyes: Negative for blurred vision and double vision.  Respiratory: Negative for cough and shortness of breath.   Cardiovascular: Negative for chest pain and palpitations.  Gastrointestinal: Negative for abdominal pain, diarrhea, heartburn and nausea.  Musculoskeletal: Positive for joint pain. Negative for myalgias.  Skin: Negative for rash.  Neurological: Negative for dizziness, tingling and headaches.  Psychiatric/Behavioral: Negative for depression. The patient is not nervous/anxious and does not have insomnia.       No other specific complaints in a complete review of systems (except as listed in HPI above).  Objective  Vitals:   07/20/18 1109  BP: 140/90  Pulse: 76  Resp: 16  Temp: 97.6 F (36.4 C)  TempSrc: Oral  SpO2: 99%  Weight: 201 lb 4.8 oz (91.3 kg)  Height: 5\' 6"  (1.676 m)     Body mass index is 32.49 kg/m.  Nursing Note and Vital Signs reviewed.  Physical Exam  Constitutional: She appears well-developed and well-nourished.  Eyes: Pupils are equal, round, and reactive to light. Conjunctivae are normal. Right eye exhibits no discharge. Left eye exhibits no discharge.  Neck: Normal range of motion. Neck supple. Carotid bruit is  not present. No thyromegaly present.  Cardiovascular: Normal heart sounds and intact distal pulses.  Pulmonary/Chest: Effort normal and breath sounds normal.  Abdominal: Soft. Bowel sounds are normal. There is no tenderness. There is no CVA tenderness.  Musculoskeletal: Normal range of motion. She exhibits no tenderness or deformity.       Left wrist: Normal. She exhibits normal range of motion, no tenderness and no swelling. Bony tenderness: no appreciable swelling at this time.   Neurological: She is alert. She has normal strength.  Reflex Scores:      Patellar reflexes are 2+ on the right side and 2+ on the left side. Skin: Skin is warm, dry and intact. No erythema.  Psychiatric: She has a normal mood and affect. Her speech is normal and behavior is normal. Judgment and thought content normal.  Vitals reviewed.  No results found for this or any previous visit (from the past 48 hour(s)).  Assessment & Plan  1. Essential hypertension, benign Discussed AAFP guidelines for management of BP in those over 60. Discussed DASH - triamterene-hydrochlorothiazide (MAXZIDE-25) 37.5-25 MG tablet; Take 1 tablet by mouth daily. Appointment and labs soon please  Dispense: 90 tablet; Refill: 1 - amLODipine (NORVASC) 2.5 MG tablet; Take 1 tablet (2.5 mg total) by mouth daily.  Dispense: 90 tablet; Refill: 1 - COMPLETE METABOLIC PANEL WITH GFR  2. Mixed hyperlipidemia Discussed diet and medication, state would be willing to consider starting medication if still elevated.  - Lipid Profile  3. Flu vaccine need - Flu Vaccine QUAD 6+ mos PF IM (Fluarix Quad PF)  4. Medication monitoring encounter Will consider starting on statin every other week if LDL is still outside of goal - COMPLETE METABOLIC PANEL WITH GFR  5. Screening for breast cancer - MM Digital Screening; Future  6. Screening for HIV (human immunodeficiency virus) - HIV antibody (with reflex)  7. Obesity (BMI 30-39.9) - Slowly  increase activity by 3 minutes a week if you can - Drink at least 8 glasses of water; and drink a full glass before eating a meal - Continue doing what you already are doing- Positive weight loss.   -Red flags and when to present for emergency care or RTC including chest pain, shortness of breath, new/worsening/un-resolving symptoms, reviewed with patient at time of visit. Follow up and care instructions discussed and provided in AVS. -Reviewed Health Maintenance: order placed for mammogram

## 2018-07-20 NOTE — Patient Instructions (Addendum)
-   Slowly increase activity by 3 minutes a week if you can - Drink at least 8 glasses of water; and drink a full glass before eating a meal - Please do call to schedule your mammogram; the number to schedule one at either [facility] or [facility] is [phone]   Good cholesterol, also called HDL removes extra cholesterol and plaque buildup in your arteries and then sends it to your liver to get rid of and helps reduce your risk of heart disease, heart attack, and stroke.Foods that increase HDL: beans and legumes, whole grains, high-fiber fruits:prunes, apples, and pears; fatty fish- salmon, tuna, sardines; nuts, olive oil.  Bad cholesterol, also called low-density lipoprotein (LDL), carries cholesterol and other fats that your liver makes to your body tissue. If it builds up in blood vessels, LDL can cause heart disease and other health problems. Your LDL level should be below 010100. If you have diabetes or a possible heart problem, your LDL should be below 70.  Eat: Eat 20 to 30 grams of soluble fiber every day. Foods such as fruits and vegetables, whole grains, beans, peas, nuts, and seeds can help lower LDL. Avoid: Saturated fats (Dairy foods - such as butter, cream, ghee, regular-fat milk and cheese. Meat - such as fatty cuts of beef, pork and lamb, processed meats like salami, sausages and the skin on chicken. Lard., fatty snack foods, cakes, biscuits, pies and deep fried foods)

## 2018-07-21 LAB — COMPLETE METABOLIC PANEL WITH GFR
AG RATIO: 1.6 (calc) (ref 1.0–2.5)
ALKALINE PHOSPHATASE (APISO): 69 U/L (ref 33–130)
ALT: 15 U/L (ref 6–29)
AST: 14 U/L (ref 10–35)
Albumin: 4.5 g/dL (ref 3.6–5.1)
BILIRUBIN TOTAL: 0.3 mg/dL (ref 0.2–1.2)
BUN/Creatinine Ratio: 14 (calc) (ref 6–22)
BUN: 15 mg/dL (ref 7–25)
CALCIUM: 9.6 mg/dL (ref 8.6–10.4)
CHLORIDE: 103 mmol/L (ref 98–110)
CO2: 29 mmol/L (ref 20–32)
Creat: 1.04 mg/dL — ABNORMAL HIGH (ref 0.50–0.99)
GFR, Est African American: 67 mL/min/{1.73_m2} (ref >=60)
GFR, Est Non African American: 58 mL/min/{1.73_m2} — ABNORMAL LOW (ref >=60)
Globulin: 2.8 g/dL (calc) (ref 1.9–3.7)
Glucose, Bld: 82 mg/dL (ref 65–139)
Potassium: 4.3 mmol/L (ref 3.5–5.3)
Sodium: 141 mmol/L (ref 135–146)
Total Protein: 7.3 g/dL (ref 6.1–8.1)

## 2018-07-21 LAB — LIPID PANEL
CHOL/HDL RATIO: 6.2 (calc) — AB (ref <5.0)
Cholesterol: 256 mg/dL — ABNORMAL HIGH (ref <200)
HDL: 41 mg/dL — AB (ref >50)
LDL CHOLESTEROL (CALC): 184 mg/dL — AB
NON-HDL CHOLESTEROL (CALC): 215 mg/dL — AB (ref <130)
Triglycerides: 159 mg/dL — ABNORMAL HIGH (ref <150)

## 2018-07-21 LAB — HIV ANTIBODY (ROUTINE TESTING W REFLEX): HIV 1&2 Ab, 4th Generation: NONREACTIVE

## 2018-07-23 ENCOUNTER — Telehealth: Payer: Self-pay | Admitting: Family Medicine

## 2018-07-23 NOTE — Telephone Encounter (Signed)
I did not order recent tests; forwarding to provider

## 2018-07-23 NOTE — Telephone Encounter (Signed)
-----   Message from Barbee CoughMelissa J Myatt sent at 07/23/2018  4:26 PM EDT ----- Pt given lab results and recommendations per notes of Elizabeth,NP on 07/23/18. Pt verbalized understanding. Pt states she would [facility] [address] Pt also wants to have a medication that she can afford because her insurance does not pay for prescriptions. Pt also asking to be prescribed a cholesterol medication that does not have a lot of side effects.

## 2018-07-24 ENCOUNTER — Other Ambulatory Visit: Payer: Self-pay | Admitting: Nurse Practitioner

## 2018-07-24 DIAGNOSIS — E782 Mixed hyperlipidemia: Secondary | ICD-10-CM

## 2018-07-24 MED ORDER — ATORVASTATIN CALCIUM 10 MG PO TABS: 10.0000 mg | ORAL_TABLET | Freq: Every day | ORAL | 1 refills | Status: DC

## 2018-07-24 NOTE — Telephone Encounter (Addendum)
All medications have the potential for side effects. I am starting you on a very low dose of atorvastatin. You can take this before bed every other day. If you have no issues with this after 2 weeks you can increase it to daily before bed. If you do have any issues you can stop the medicine and call to let us know.    __________________________________ ----- Message from Kerman PasseyMelinda P Lada, MD sent at 07/23/2018  4:35 PM EDT -----   ----- Message ----- From: Vic BlackbirdMyatt, Melissa J Sent: 07/23/2018   4:26 PM EDT To: Kerman PasseyMelinda P Lada, MD  Pt given lab results and recommendations per notes of Rasheda Ledger,NP on 07/23/18. Pt verbalized understanding. Pt states she would like for prescription of cholesterol medication to be sent to [facility] on [address] Pt also wants to have a medication that she can afford because her insurance does not pay for prescriptions. Pt also asking to be prescribed a cholesterol medication that does not have a lot of side effects.

## 2018-07-25 NOTE — Telephone Encounter (Signed)
Called patient in regards to her concerns about starting Atorvastatin. Patient did not answer. I left a message for her to call if she had in other concerns.

## 2018-08-19 ENCOUNTER — Other Ambulatory Visit: Payer: Self-pay | Admitting: Nurse Practitioner

## 2018-08-19 DIAGNOSIS — E782 Mixed hyperlipidemia: Secondary | ICD-10-CM

## 2018-09-04 ENCOUNTER — Other Ambulatory Visit: Payer: Self-pay | Admitting: Nurse Practitioner

## 2018-09-04 DIAGNOSIS — E782 Mixed hyperlipidemia: Secondary | ICD-10-CM

## 2018-09-04 MED ORDER — ATORVASTATIN CALCIUM 10 MG PO TABS: 10.0000 mg | ORAL_TABLET | Freq: Every day | ORAL | 1 refills | Status: DC

## 2018-10-25 ENCOUNTER — Encounter: Payer: Self-pay | Admitting: Family Medicine

## 2018-10-25 ENCOUNTER — Ambulatory Visit (INDEPENDENT_AMBULATORY_CARE_PROVIDER_SITE_OTHER): Payer: No Typology Code available for payment source | Admitting: Family Medicine

## 2018-10-25 VITALS — BP 128/74 | HR 78 | Temp 98.0°F | Ht 65.0 in | Wt 199.9 lb

## 2018-10-25 DIAGNOSIS — Z0000 Encounter for general adult medical examination without abnormal findings: Secondary | ICD-10-CM | POA: Diagnosis not present

## 2018-10-25 NOTE — Patient Instructions (Addendum)
Check with [facility] to see when your next colonoscopy is due Please do call to schedule your mammogram; the number to schedule one at either [facility] or [facility] is [phone] I'll recommend a baby (81 mg) aspirin daily to hopefully protect you from a heart attack  Health Maintenance, Female Adopting a healthy lifestyle and getting preventive care can go a long way to promote health and wellness. Talk with your health care provider about what schedule of regular examinations is right for you. This is a good chance for you to check in with your provider about disease prevention and staying healthy. In between checkups, there are plenty of things you can do on your own. Experts have done a lot of research about which lifestyle changes and preventive measures are most likely to keep you healthy. Ask your health care provider for more information. Weight and diet Eat a healthy diet  Be sure to include plenty of vegetables, fruits, low-fat dairy products, and lean protein.  Do not eat a lot of foods high in solid fats, added sugars, or salt.  Get regular exercise. This is one of the most important things you can do for your health. ? Most adults should exercise for at least 150 minutes each week. The exercise should increase your heart rate and make you sweat (moderate-intensity exercise). ? Most adults should also do strengthening exercises at least twice a week. This is in addition to the moderate-intensity exercise. Maintain a healthy weight  Body mass index (BMI) is a measurement that can be used to identify possible weight problems. It estimates body fat based on height and weight. Your health care provider can help determine your BMI and help you achieve or maintain a healthy weight.  For females 24 years of age and older: ? A BMI below 18.5 is considered underweight. ? A BMI of 18.5 to 24.9 is normal. ? A BMI of 25 to 29.9 is considered  overweight. ? A BMI of 30 and above is considered obese. Watch levels of cholesterol and blood lipids  You should start having your blood tested for lipids and cholesterol at 61 years of age, then have this test every 5 years.  You may need to have your cholesterol levels checked more often if: ? Your lipid or cholesterol levels are high. ? You are older than 61 years of age. ? You are at high risk for heart disease. Cancer screening Lung Cancer  Lung cancer screening is recommended for adults 61-31 years old who are at high risk for lung cancer because of a history of smoking.  A yearly low-dose CT scan of the lungs is recommended for people who: ? Currently smoke. ? Have quit within the past 15 years. ? Have at least a 30-pack-year history of smoking. A pack year is smoking an average of one pack of cigarettes a day for 1 year.  Yearly screening should continue until it has been 15 years since you quit.  Yearly screening should stop if you develop a health problem that would prevent you from having lung cancer treatment. Breast Cancer  Practice breast self-awareness. This means understanding how your breasts normally appear and feel.  It also means doing regular breast self-exams. Let your health care provider know about any changes, no matter how small.  If you are in your 20s or 30s, you should have a clinical breast exam (CBE) by a health care provider every 1-3 years as part of a regular  health exam.  If you are 40 or older, have a CBE every year. Also consider having a breast X-ray (mammogram) every year.  If you have a family history of breast cancer, talk to your health care provider about genetic screening.  If you are at high risk for breast cancer, talk to your health care provider about having an MRI and a mammogram every year.  Breast cancer gene (BRCA) assessment is recommended for women who have family members with BRCA-related cancers. BRCA-related cancers  include: ? Breast. ? Ovarian. ? Tubal. ? Peritoneal cancers.  Results of the assessment will determine the need for genetic counseling and BRCA1 and BRCA2 testing. Cervical Cancer Your health care provider may recommend that you be screened regularly for cancer of the pelvic organs (ovaries, uterus, and vagina). This screening involves a pelvic examination, including checking for microscopic changes to the surface of your cervix (Pap test). You may be encouraged to have this screening done every 3 years, beginning at age 61.  For women ages 61-65, health care providers may recommend pelvic exams and Pap testing every 3 years, or they may recommend the Pap and pelvic exam, combined with testing for human papilloma virus (HPV), every 5 years. Some types of HPV increase your risk of cervical cancer. Testing for HPV may also be done on women of any age with unclear Pap test results.  Other health care providers may not recommend any screening for nonpregnant women who are considered low risk for pelvic cancer and who do not have symptoms. Ask your health care provider if a screening pelvic exam is right for you.  If you have had past treatment for cervical cancer or a condition that could lead to cancer, you need Pap tests and screening for cancer for at least 20 years after your treatment. If Pap tests have been discontinued, your risk factors (such as having a new sexual partner) need to be reassessed to determine if screening should resume. Some women have medical problems that increase the chance of getting cervical cancer. In these cases, your health care provider may recommend more frequent screening and Pap tests. Colorectal Cancer  This type of cancer can be detected and often prevented.  Routine colorectal cancer screening usually begins at 61 years of age and continues through 61 years of age.  Your health care provider may recommend screening at an earlier age if you have risk factors for  colon cancer.  Your health care provider may also recommend using home test kits to check for hidden blood in the stool.  A small camera at the end of a tube can be used to examine your colon directly (sigmoidoscopy or colonoscopy). This is done to check for the earliest forms of colorectal cancer.  Routine screening usually begins at age 28.  Direct examination of the colon should be repeated every 5-10 years through 61 years of age. However, you may need to be screened more often if early forms of precancerous polyps or small growths are found. Skin Cancer  Check your skin from head to toe regularly.  Tell your health care provider about any new moles or changes in moles, especially if there is a change in a mole's shape or color.  Also tell your health care provider if you have a mole that is larger than the size of a pencil eraser.  Always use sunscreen. Apply sunscreen liberally and repeatedly throughout the day.  Protect yourself by wearing long sleeves, pants, a wide-brimmed hat, and  sunglasses whenever you are outside. Heart disease, diabetes, and high blood pressure  High blood pressure causes heart disease and increases the risk of stroke. High blood pressure is more likely to develop in: ? People who have blood pressure in the high end of the normal range (130-139/85-89 mm Hg). ? People who are overweight or obese. ? People who are African American.  If you are 33-69 years of age, have your blood pressure checked every 3-5 years. If you are 49 years of age or older, have your blood pressure checked every year. You should have your blood pressure measured twice-once when you are at a hospital or clinic, and once when you are not at a hospital or clinic. Record the average of the two measurements. To check your blood pressure when you are not at a hospital or clinic, you can use: ? An automated blood pressure machine at a pharmacy. ? A home blood pressure monitor.  If you are  between 29 years and 30 years old, ask your health care provider if you should take aspirin to prevent strokes.  Have regular diabetes screenings. This involves taking a blood sample to check your fasting blood sugar level. ? If you are at a normal weight and have a low risk for diabetes, have this test once every three years after 61 years of age. ? If you are overweight and have a high risk for diabetes, consider being tested at a younger age or more often. Preventing infection Hepatitis B  If you have a higher risk for hepatitis B, you should be screened for this virus. You are considered at high risk for hepatitis B if: ? You were born in a country where hepatitis B is common. Ask your health care provider which countries are considered high risk. ? Your parents were born in a high-risk country, and you have not been immunized against hepatitis B (hepatitis B vaccine). ? You have HIV or AIDS. ? You use needles to inject street drugs. ? You live with someone who has hepatitis B. ? You have had sex with someone who has hepatitis B. ? You get hemodialysis treatment. ? You take certain medicines for conditions, including cancer, organ transplantation, and autoimmune conditions. Hepatitis C  Blood testing is recommended for: ? Everyone born from [birth year] through 1964. ? Anyone with known risk factors for hepatitis C. Sexually transmitted infections (STIs)  You should be screened for sexually transmitted infections (STIs) including gonorrhea and chlamydia if: ? You are sexually active and are younger than 62 years of age. ? You are older than 61 years of age and your health care provider tells you that you are at risk for this type of infection. ? Your sexual activity has changed since you were last screened and you are at an increased risk for chlamydia or gonorrhea. Ask your health care provider if you are at risk.  If you do not have HIV, but are at risk, it may be recommended that you take  a prescription medicine daily to prevent HIV infection. This is called pre-exposure prophylaxis (PrEP). You are considered at risk if: ? You are sexually active and do not regularly use condoms or know the HIV status of your partner(s). ? You take drugs by injection. ? You are sexually active with a partner who has HIV. Talk with your health care provider about whether you are at high risk of being infected with HIV. If you choose to begin PrEP, you should first be tested  for HIV. You should then be tested every 3 months for as long as you are taking PrEP. Pregnancy  If you are premenopausal and you may become pregnant, ask your health care provider about preconception counseling.  If you may become pregnant, take 400 to 800 micrograms (mcg) of folic acid every day.  If you want to prevent pregnancy, talk to your health care provider about birth control (contraception). Osteoporosis and menopause  Osteoporosis is a disease in which the bones lose minerals and strength with aging. This can result in serious bone fractures. Your risk for osteoporosis can be identified using a bone density scan.  If you are 18 years of age or older, or if you are at risk for osteoporosis and fractures, ask your health care provider if you should be screened.  Ask your health care provider whether you should take a calcium or vitamin D supplement to lower your risk for osteoporosis.  Menopause may have certain physical symptoms and risks.  Hormone replacement therapy may reduce some of these symptoms and risks. Talk to your health care provider about whether hormone replacement therapy is right for you. Follow these instructions at home:  Schedule regular health, dental, and eye exams.  Stay current with your immunizations.  Do not use any tobacco products including cigarettes, chewing tobacco, or electronic cigarettes.  If you are pregnant, do not drink alcohol.  If you are breastfeeding, limit how  much and how often you drink alcohol.  Limit alcohol intake to no more than 1 drink per day for nonpregnant women. One drink equals 12 ounces of beer, 5 ounces of wine, or 1 ounces of hard liquor.  Do not use street drugs.  Do not share needles.  Ask your health care provider for help if you need support or information about quitting drugs.  Tell your health care provider if you often feel depressed.  Tell your health care provider if you have ever been abused or do not feel safe at home. This information is not intended to replace advice given to you by your health care provider. Make sure you discuss any questions you have with your health care provider. Document Released: 05/09/2011 Document Revised: 03/31/2016 Document Reviewed: 07/28/2015 Elsevier Interactive Patient Education  2019 [employer].

## 2018-10-25 NOTE — Assessment & Plan Note (Signed)
USPSTF grade A and B recommendations reviewed with patient; age-appropriate recommendations, preventive care, screening tests, etc discussed and encouraged; healthy living encouraged; see AVS for patient education given to patient  

## 2018-10-25 NOTE — Progress Notes (Signed)
 BP 128/74   Pulse 78   Temp 98 F (36.7 C)   Ht 5' 5" (1.651 m)   Wt 199 lb 14.4 oz (90.7 kg)   BMI 33.27 kg/m    Subjective:    Patient ID: Vanessa Gross, female    DOB: 03/28/1956, 62 y.o.   MRN: [id number]  HPI: Vanessa Gross is a 62 y.o. female  Chief Complaint  Patient presents with  . Annual Exam    HPI USPSTF grade A and B recommendations Depression:  Depression screen PHQ 2/9 10/25/2018 01/12/2018 05/16/2017 12/20/2016 11/17/2016  Decreased Interest 0 0 0 0 0  Down, Depressed, Hopeless 0 1 0 0 0  PHQ - 2 Score 0 1 0 0 0  Altered sleeping 0 - - - -  Tired, decreased energy 0 - - - -  Change in appetite 0 - - - -  Feeling bad or failure about yourself  0 - - - -  Trouble concentrating 0 - - - -  Moving slowly or fidgety/restless 0 - - - -  Suicidal thoughts 0 - - - -  PHQ-9 Score 0 - - - -  Difficult doing work/chores Not difficult at all - - - -   Hypertension: BP Readings from Last 3 Encounters:  10/25/18 128/74  07/20/18 140/90  01/12/18 136/78   Obesity: Wt Readings from Last 3 Encounters:  10/25/18 199 lb 14.4 oz (90.7 kg)  07/20/18 201 lb 4.8 oz (91.3 kg)  01/12/18 207 lb 11.2 oz (94.2 kg)   BMI Readings from Last 3 Encounters:  10/25/18 33.27 kg/m  07/20/18 32.49 kg/m  01/12/18 33.52 kg/m     Skin cancer: no new moles; just two on the right arm; avoiding sun Lung cancer:  Never smoker, not much passive at all Breast cancer: no lumps or bumps; last in 2016; pt will schedule Colorectal cancer: colonoscopy done in 2012; [facility]; pt to check with [facility] Cervical cancer screening: s/p hyst for bleeding, but not cancer BRCA gene screening: family hx of breast and/or ovarian cancer and/or metastatic prostate cancer? Cousin with breast cancer; no to others HIV, hep B, hep C: n/a STD testing and prevention (chl/gon/syphilis): n/a Intimate partner violence: no abuse Contraception: n/a Osteoporosis: no hx of thin bones; start at age 65 Fall  prevention/vitamin D: discussed Immunizations: flu shot UTD; shingrix discussed Diet: salads; some fruits and veggies; not much fatty meats Exercise: every day almost Alcohol:    Office Visit from 10/25/2018 in [facility]  AUDIT-C Score  0      Tobacco use: never AAA: n/a Aspirin: not taking  The 10-year ASCVD risk score (Goff DC Jr., et al., 2013) is: 10.5%   Values used to calculate the score:     Age: 62 years     Sex: Female     Is Non-Hispanic African American: Yes     Diabetic: No     Tobacco smoker: No     Systolic Blood Pressure: 128 mmHg     Is BP treated: Yes     HDL Cholesterol: 41 mg/dL     Total Cholesterol: 256 mg/dL  Glucose:  Glucose, Bld  Date Value Ref Range Status  07/20/2018 82 65 - 139 mg/dL Final    Comment:    .        Non-fasting reference interval .   01/12/2018 83 65 - 139 mg/dL Final    Comment:    .          Non-fasting reference interval .   12/20/2016 85 65 - 99 mg/dL Final   Lipids: has not started the medicine yet for her cholesterol  Lab Results  Component Value Date   CHOL 256 (H) 07/20/2018   CHOL 280 (H) 01/12/2018   CHOL 249 (H) 12/20/2016   Lab Results  Component Value Date   HDL 41 (L) 07/20/2018   HDL 42 (L) 01/12/2018   HDL 38 (L) 12/20/2016   Lab Results  Component Value Date   LDLCALC 184 (H) 07/20/2018   LDLCALC 205 (H) 01/12/2018   LDLCALC 164 (H) 12/20/2016   Lab Results  Component Value Date   TRIG 159 (H) 07/20/2018   TRIG 164 (H) 01/12/2018   TRIG 234 (H) 12/20/2016   Lab Results  Component Value Date   CHOLHDL 6.2 (H) 07/20/2018   CHOLHDL 6.7 (H) 01/12/2018   CHOLHDL 6.6 (H) 12/20/2016   No results found for: LDLDIRECT   Depression screen PHQ 2/9 10/25/2018 01/12/2018 05/16/2017 12/20/2016 11/17/2016  Decreased Interest 0 0 0 0 0  Down, Depressed, Hopeless 0 1 0 0 0  PHQ - 2 Score 0 1 0 0 0  Altered sleeping 0 - - - -  Tired, decreased energy 0 - - - -  Change in appetite  0 - - - -  Feeling bad or failure about yourself  0 - - - -  Trouble concentrating 0 - - - -  Moving slowly or fidgety/restless 0 - - - -  Suicidal thoughts 0 - - - -  PHQ-9 Score 0 - - - -  Difficult doing work/chores Not difficult at all - - - -   Fall Risk  10/25/2018 07/20/2018 01/12/2018 05/16/2017 12/20/2016  Falls in the past year? 0 No No No No    Relevant past medical, surgical, family and social history reviewed Past Medical History:  Diagnosis Date  . Benign paroxysmal positional vertigo    bilateral  . Chronic radicular lumbar pain   . Controlled substance agreement signed 05/16/2017   May 16, 2017  . Hair loss   . HTN, goal below 140/90   . Hyperlipidemia LDL goal <100   . Obesity, Class I, BMI 30.0-34.9 (see actual BMI)   . 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 ABDOMINAL HYSTERECTOMY    . BREAST BIOPSY Left    benign  . TONSILLECTOMY AND ADENOIDECTOMY     Family History  Problem Relation Age of Onset  . Hypertension Mother   . Cancer Mother        lpymphoma  . Stroke Maternal Grandmother   . Stroke Maternal Grandfather    Social History   Tobacco Use  . Smoking status: Never Smoker  . Smokeless tobacco: Never Used  Substance Use Topics  . Alcohol use: No    Alcohol/week: 0.0 standard drinks  . Drug use: No     Office Visit from 10/25/2018 in [facility]  AUDIT-C Score  0      Interim medical history since last visit reviewed. Allergies and medications reviewed  Review of Systems  Constitutional: Negative for fever and unexpected weight change.  Respiratory: Negative for wheezing.   Cardiovascular: Negative for chest pain.  Gastrointestinal: Negative for blood in stool.  Endocrine: Negative for polydipsia.       Occasional hot flashes  Genitourinary: Negative for hematuria.  Musculoskeletal: Negative for arthralgias and joint swelling.      Allergic/Immunologic: Negative for food allergies.  Neurological: Negative for tremors.  Hematological: Negative for adenopathy. Does not bruise/bleed easily.   Per HPI unless specifically indicated above     Objective:    BP 128/74   Pulse 78   Temp 98 F (36.7 C)   Ht 5' 5" (1.651 m)   Wt 199 lb 14.4 oz (90.7 kg)   BMI 33.27 kg/m   Wt Readings from Last 3 Encounters:  10/25/18 199 lb 14.4 oz (90.7 kg)  07/20/18 201 lb 4.8 oz (91.3 kg)  01/12/18 207 lb 11.2 oz (94.2 kg)    Physical Exam Constitutional:      Appearance: Normal appearance. She is well-developed.  HENT:     Head: Normocephalic and atraumatic.     Right Ear: Hearing, tympanic membrane, ear canal and external ear normal.     Left Ear: Hearing, tympanic membrane, ear canal and external ear normal.  Eyes:     General: No scleral icterus.       Right eye: No hordeolum.        Left eye: No hordeolum.     Conjunctiva/sclera: Conjunctivae normal.  Neck:     Thyroid: No thyromegaly.     Vascular: No carotid bruit.  Cardiovascular:     Rate and Rhythm: Normal rate and regular rhythm.  No extrasystoles are present.    Heart sounds: Normal heart sounds, S1 normal and S2 normal.  Pulmonary:     Effort: Pulmonary effort is normal. No respiratory distress.     Breath sounds: Normal breath sounds.  Chest:     Breasts: Breasts are symmetrical.        Right: No inverted nipple, mass, nipple discharge, skin change or tenderness.        Left: No inverted nipple, mass, nipple discharge, skin change or tenderness.  Abdominal:     General: Bowel sounds are normal. There is no distension or abdominal bruit.     Palpations: Abdomen is soft. There is no mass or pulsatile mass.     Tenderness: There is no abdominal tenderness.     Hernia: No hernia is present.  Musculoskeletal: Normal range of motion.  Lymphadenopathy:     Head:     Right side of head: No submandibular adenopathy.     Left side of head: No submandibular  adenopathy.     Cervical: No cervical adenopathy.  Skin:    General: Skin is warm and dry.     Coloration: Skin is not pale.     Findings: No bruising or ecchymosis.  Neurological:     Mental Status: She is alert.     Cranial Nerves: No cranial nerve deficit.     Motor: No tremor or abnormal muscle tone.     Gait: Gait normal.     Deep Tendon Reflexes:     Reflex Scores:      Patellar reflexes are 1+ on the right side and 1+ on the left side. Psychiatric:        Mood and Affect: Mood is not anxious or depressed.        Speech: Speech normal.        Behavior: Behavior normal.        Thought Content: Thought content normal.    Results for orders placed or performed in visit on 07/20/18  Lipid Profile  Result Value Ref Range   Cholesterol 256 (H) <200 mg/dL   HDL 41 (L) >50 mg/dL   Triglycerides 159 (H) <150  mg/dL   LDL Cholesterol (Calc) 184 (H) mg/dL (calc)   Total CHOL/HDL Ratio 6.2 (H) <5.0 (calc)   Non-HDL Cholesterol (Calc) 215 (H) <130 mg/dL (calc)  COMPLETE METABOLIC PANEL WITH GFR  Result Value Ref Range   Glucose, Bld 82 65 - 139 mg/dL   BUN 15 7 - 25 mg/dL   Creat 1.04 (H) 0.50 - 0.99 mg/dL   GFR, Est Non African American 58 (L) > OR = 60 mL/min/1.73m2   GFR, Est African American 67 > OR = 60 mL/min/1.73m2   BUN/Creatinine Ratio 14 6 - 22 (calc)   Sodium 141 135 - 146 mmol/L   Potassium 4.3 3.5 - 5.3 mmol/L   Chloride 103 98 - 110 mmol/L   CO2 29 20 - 32 mmol/L   Calcium 9.6 8.6 - 10.4 mg/dL   Total Protein 7.3 6.1 - 8.1 g/dL   Albumin 4.5 3.6 - 5.1 g/dL   Globulin 2.8 1.9 - 3.7 g/dL (calc)   AG Ratio 1.6 1.0 - 2.5 (calc)   Total Bilirubin 0.3 0.2 - 1.2 mg/dL   Alkaline phosphatase (APISO) 69 33 - 130 U/L   AST 14 10 - 35 U/L   ALT 15 6 - 29 U/L  HIV Antibody (routine testing w rflx)  Result Value Ref Range   HIV 1&2 Ab, 4th Generation NON-REACTIVE NON-REACTI      Assessment & Plan:   Problem List Items Addressed This Visit      Other    Preventative health care - Primary    USPSTF grade A and B recommendations reviewed with patient; age-appropriate recommendations, preventive care, screening tests, etc discussed and encouraged; healthy living encouraged; see AVS for patient education given to patient       Relevant Orders   CBC with Differential/Platelet   COMPLETE METABOLIC PANEL WITH GFR   Lipid panel   TSH       Follow up plan: Return in about 1 year (around 10/26/2019) for complete physical.  An after-visit summary was printed and given to the patient at check-out.  Please see the patient instructions which may contain other information and recommendations beyond what is mentioned above in the assessment and plan.  No orders of the defined types were placed in this encounter.   Orders Placed This Encounter  Procedures  . CBC with Differential/Platelet  . COMPLETE METABOLIC PANEL WITH GFR  . Lipid panel  . TSH       

## 2018-10-26 ENCOUNTER — Other Ambulatory Visit: Payer: Self-pay | Admitting: Family Medicine

## 2018-10-26 DIAGNOSIS — Z1231 Encounter for screening mammogram for malignant neoplasm of breast: Secondary | ICD-10-CM

## 2018-10-26 LAB — CBC WITH DIFFERENTIAL/PLATELET
ABSOLUTE MONOCYTES: 354 {cells}/uL (ref 200–950)
BASOS ABS: 83 {cells}/uL (ref 0–200)
Basophils Relative: 1.4 %
EOS PCT: 1.2 %
Eosinophils Absolute: 71 cells/uL (ref 15–500)
HEMATOCRIT: 45.2 % — AB (ref 35.0–45.0)
HEMOGLOBIN: 14 g/dL (ref 11.7–15.5)
Lymphs Abs: 2431 cells/uL (ref 850–3900)
MCH: 20.9 pg — AB (ref 27.0–33.0)
MCHC: 31 g/dL — AB (ref 32.0–36.0)
MCV: 67.5 fL — ABNORMAL LOW (ref 80.0–100.0)
MPV: 11.5 fL (ref 7.5–12.5)
Monocytes Relative: 6 %
NEUTROS PCT: 50.2 %
Neutro Abs: 2962 cells/uL (ref 1500–7800)
Platelets: 322 10*3/uL (ref 140–400)
RBC: 6.7 10*6/uL — ABNORMAL HIGH (ref 3.80–5.10)
RDW: 17 % — AB (ref 11.0–15.0)
TOTAL LYMPHOCYTE: 41.2 %
WBC: 5.9 10*3/uL (ref 3.8–10.8)

## 2018-10-26 LAB — COMPLETE METABOLIC PANEL WITH GFR
AG Ratio: 1.6 (calc) (ref 1.0–2.5)
ALKALINE PHOSPHATASE (APISO): 74 U/L (ref 33–130)
ALT: 16 U/L (ref 6–29)
AST: 16 U/L (ref 10–35)
Albumin: 4.5 g/dL (ref 3.6–5.1)
BILIRUBIN TOTAL: 0.3 mg/dL (ref 0.2–1.2)
BUN: 14 mg/dL (ref 7–25)
CHLORIDE: 101 mmol/L (ref 98–110)
CO2: 31 mmol/L (ref 20–32)
CREATININE: 0.92 mg/dL (ref 0.50–0.99)
Calcium: 9.8 mg/dL (ref 8.6–10.4)
GFR, Est African American: 78 mL/min/{1.73_m2} (ref >=60)
GFR, Est Non African American: 67 mL/min/{1.73_m2} (ref >=60)
Globulin: 2.9 g/dL (calc) (ref 1.9–3.7)
Glucose, Bld: 118 mg/dL — ABNORMAL HIGH (ref 65–99)
POTASSIUM: 4.1 mmol/L (ref 3.5–5.3)
Sodium: 140 mmol/L (ref 135–146)
Total Protein: 7.4 g/dL (ref 6.1–8.1)

## 2018-10-26 LAB — LIPID PANEL
CHOLESTEROL: 284 mg/dL — AB (ref <200)
HDL: 43 mg/dL — AB (ref >50)
LDL Cholesterol (Calc): 196 mg/dL (calc) — ABNORMAL HIGH
Non-HDL Cholesterol (Calc): 241 mg/dL (calc) — ABNORMAL HIGH (ref <130)
Total CHOL/HDL Ratio: 6.6 (calc) — ABNORMAL HIGH (ref <5.0)
Triglycerides: 244 mg/dL — ABNORMAL HIGH (ref <150)

## 2018-10-26 LAB — TSH: TSH: 0.7 mIU/L (ref 0.40–4.50)

## 2018-10-29 NOTE — Progress Notes (Signed)
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 Her glucose was up just a little bit; if she was fasting, that's "prediabetes"; if she wasn't fasting, no worries; weight loss and healthy eating are important for prediabetes Her cholesterol panel is really quite high; I have no doubt that this is inherited; high LDL over time can lead to plaque which can cause heart attacks and strokes; we'll really encourage her to take the atorvastatin and recheck lipids in 6 weeks (please ORDER); if she refuses and wants another medicine, back to me for Rx; either way, we need to recheck her cholesterol in 6 weeks; of course, healthy eating and weight loss and key for this issue too Her thyroid is normal

## 2018-10-30 ENCOUNTER — Telehealth: Payer: Self-pay

## 2018-10-30 DIAGNOSIS — E782 Mixed hyperlipidemia: Secondary | ICD-10-CM

## 2018-10-30 NOTE — Telephone Encounter (Signed)
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 Her glucose was up just a little bit; if she was fasting, that's "prediabetes"; if she wasn't fasting, no worries; weight loss and healthy eating are important for prediabetes Her cholesterol panel is really quite high; I have no doubt that this is inherited; high LDL over time can lead to plaque which can cause heart attacks and strokes; we'll really encourage her to take the atorvastatin and recheck lipids in 6 weeks (please ORDER); if she refuses and wants another medicine, back to me for Rx; either way, we need to recheck her cholesterol in 6 weeks; of course, healthy eating and weight loss and key for this issue too Her thyroid is normal

## 2018-11-30 ENCOUNTER — Ambulatory Visit
Admission: RE | Admit: 2018-11-30 | Discharge: 2018-11-30 | Disposition: A | Discharge status: Home / Self Care | Payer: Managed Care, Other (non HMO) | Source: Ambulatory Visit | Attending: Family Medicine | Admitting: Family Medicine

## 2018-11-30 DIAGNOSIS — Z1231 Encounter for screening mammogram for malignant neoplasm of breast: Secondary | ICD-10-CM | POA: Diagnosis not present

## 2019-02-05 ENCOUNTER — Ambulatory Visit: Payer: Self-pay | Admitting: Family Medicine

## 2019-02-06 ENCOUNTER — Ambulatory Visit (INDEPENDENT_AMBULATORY_CARE_PROVIDER_SITE_OTHER): Payer: No Typology Code available for payment source | Admitting: Family Medicine

## 2019-02-06 ENCOUNTER — Telehealth: Payer: Self-pay

## 2019-02-06 ENCOUNTER — Encounter: Payer: Self-pay | Admitting: Family Medicine

## 2019-02-06 DIAGNOSIS — H1013 Acute atopic conjunctivitis, bilateral: Secondary | ICD-10-CM | POA: Diagnosis not present

## 2019-02-06 DIAGNOSIS — E782 Mixed hyperlipidemia: Secondary | ICD-10-CM

## 2019-02-06 DIAGNOSIS — R718 Other abnormality of red blood cells: Secondary | ICD-10-CM | POA: Diagnosis not present

## 2019-02-06 DIAGNOSIS — I10 Essential (primary) hypertension: Secondary | ICD-10-CM | POA: Diagnosis not present

## 2019-02-06 DIAGNOSIS — J309 Allergic rhinitis, unspecified: Secondary | ICD-10-CM

## 2019-02-06 MED ORDER — LORATADINE 10 MG PO TABS: 10.0000 mg | ORAL_TABLET | Freq: Every day | ORAL | 3 refills | Status: DC | PRN

## 2019-02-06 MED ORDER — TRIAMTERENE-HCTZ 37.5-25 MG PO TABS: 1.0000 | ORAL_TABLET | Freq: Every day | ORAL | 1 refills | Status: DC

## 2019-02-06 MED ORDER — AMLODIPINE BESYLATE 2.5 MG PO TABS: 2.5000 mg | ORAL_TABLET | Freq: Every day | ORAL | 1 refills | Status: DC

## 2019-02-06 MED ORDER — ASPIRIN EC 81 MG PO TBEC: 81.0000 mg | DELAYED_RELEASE_TABLET | Freq: Every day | ORAL | Status: AC

## 2019-02-06 MED ORDER — FLUTICASONE PROPIONATE 50 MCG/ACT NA SUSP: 2.0000 | Freq: Every day | NASAL | 3 refills | Status: DC

## 2019-02-06 NOTE — Telephone Encounter (Signed)
Visit with Dr. Sherie Don was completed.  Copied from CRM [id number]. Topic: General - Other >> Feb 06, 2019  1:16 PM Gwenlyn Fudge A wrote: Reason for CRM: Pt called and stated she spoke to a nurse earlier who informed her that Dr. Sherie Don would call her back for a virtual visit @ 10:40. Pt states Dr. Sherie Don never called her back. Pt is unhappy because she spent her day waiting on the call from Dr. Sherie Don and now she is not sure what she should do. Pt had appt scheduled for Friday 02/08/2019. Pt was informed that she will receive a call from Dr. Sherie Don today. Please advise. Pt phone #[phone]

## 2019-02-06 NOTE — Telephone Encounter (Signed)
Visit with Dr. Sherie Don complete.   Copied from CRM [id number]. Topic: Quick Conservator, museum/gallery Patient (Clinic Use ONLY) >> Feb 06, 2019  1:17 PM Vanessa Gross wrote: Reason for CRM: pt states that someone was suppose to call her back and she has not heard anything. Please give her a call [phone] or [phone]

## 2019-02-06 NOTE — Progress Notes (Signed)
There were no vitals taken for this visit.   Subjective:    Patient ID: Vanessa Gross, female    DOB: [birth year], 63 y.o.   MRN: [id number]  HPI: Vanessa Gross is a 63 y.o. female  Chief Complaint  Patient presents with  . Medication Refill    HPI Virtual Visit via Telephone Note   I tried to connect with Lendell Caprice on February 06, 2019 at 11:05 am EDT by telephone; however, I could not reach her; will try later...  I connected with Lendell Caprice on February 06, 2019 at 1:18 pm on February 06, 2019 EDT by telephone I verified that I am speaking with the correct person using two identifiers.   Staff discussed the limitations, risks, security, and privacy concerns of performing an evaluation and management service by telephone and the availability of in-person appointments. Staff discussed with the patient that he/she may be responsible for charges related to this service. The patient expressed understanding and agreed to proceed. The patient was asked to check her surroundings, be sure to keep phone off of speaker, let me know immediately if someone comes near and our conversation has to be terminated or paused for privacy concerns.  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; they are a Physiological scientist; uncertainty in the future about her insurance, getting medicines, paying for things  Issues include hypertension; she needs refills of medicine She does not check her BP at home No added salt  BP Readings from Last 3 Encounters:  10/25/18 128/74  07/20/18 140/90  01/12/18 136/78   She has been having sinus issues; the claritin is so high for her to buy; using Claritin-D right now (I advised right away to stop that and use plain  claritin)  High cholesterol; she never took the atorvastatin; the good Lord didn't tell her to take it; she wasn't feeling it She is trying to eat better, eating apples and fruit; stays away from cheese and pizza; no cheese on salads, trying to eat well  Lab Results  Component Value Date   CHOL 284 (H) 10/25/2018   HDL 43 (L) 10/25/2018   LDLCALC 196 (H) 10/25/2018   TRIG 244 (H) 10/25/2018   CHOLHDL 6.6 (H) 10/25/2018    Depression screen PHQ 2/9 02/06/2019 10/25/2018 01/12/2018 05/16/2017 12/20/2016  Decreased Interest 0 0 0 0 0  Down, Depressed, Hopeless 0 0 1 0 0  PHQ - 2 Score 0 0 1 0 0  Altered sleeping 0 0 - - -  Tired, decreased energy 0 0 - - -  Change in appetite 0 0 - - -  Feeling bad or failure about yourself  0 0 - - -  Trouble concentrating 0 0 - - -  Moving slowly or fidgety/restless 0 0 - - -  Suicidal thoughts 0 0 - - -  PHQ-9 Score 0 0 - - -  Difficult doing work/chores Not difficult at all Not difficult at all - - -   Fall Risk  02/06/2019 10/25/2018 07/20/2018 01/12/2018 05/16/2017  Falls in the past year? 0 0 No No No    Relevant past medical, surgical, family and social history reviewed Past Medical History:  Diagnosis Date  . Benign paroxysmal positional vertigo  bilateral  . Chronic radicular lumbar pain   . Controlled substance agreement signed 05/16/2017   May 16, 2017  . Hair loss   . HTN, goal below 140/90   . Hyperlipidemia LDL goal <100   . Obesity, Class I, BMI 30.0-34.9 (see actual BMI)   . 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 ABDOMINAL HYSTERECTOMY    . BREAST BIOPSY Left    benign  . TONSILLECTOMY AND ADENOIDECTOMY     Family History  Problem Relation Age of Onset  . Hypertension Mother   . Cancer Mother        lpymphoma  . Stroke Maternal Grandmother   . Stroke Maternal Grandfather   . Breast cancer Cousin    Social History   Tobacco Use  .  Smoking status: Never Smoker  . Smokeless tobacco: Never Used  Substance Use Topics  . Alcohol use: No    Alcohol/week: 0.0 standard drinks  . Drug use: No     Office Visit from 02/06/2019 in [facility]  AUDIT-C Score  0      Interim medical history since last visit reviewed. Allergies and medications reviewed  Review of Systems Per HPI unless specifically indicated above     Objective:    There were no vitals taken for this visit.  Wt Readings from Last 3 Encounters:  10/25/18 199 lb 14.4 oz (90.7 kg)  07/20/18 201 lb 4.8 oz (91.3 kg)  01/12/18 207 lb 11.2 oz (94.2 kg)    Physical Exam Pulmonary:     Effort: No respiratory distress.  Neurological:     Mental Status: She is alert.     Cranial Nerves: No dysarthria.  Psychiatric:        Speech: Speech is not rapid and pressured, delayed or slurred.    Results for orders placed or performed in visit on 10/25/18  CBC with Differential/Platelet  Result Value Ref Range   WBC 5.9 3.8 - 10.8 Thousand/uL   RBC 6.70 (H) 3.80 - 5.10 Million/uL   Hemoglobin 14.0 11.7 - 15.5 g/dL   HCT 16.1 (H) 09.6 - 04.5 %   MCV 67.5 (L) 80.0 - 100.0 fL   MCH 20.9 (L) 27.0 - 33.0 pg   MCHC 31.0 (L) 32.0 - 36.0 g/dL   RDW 40.9 (H) 81.1 - 91.4 %   Platelets 322 140 - 400 Thousand/uL   MPV 11.5 7.5 - 12.5 fL   Neutro Abs 2,962 1,500 - 7,800 cells/uL   Lymphs Abs 2,431 850 - 3,900 cells/uL   Absolute Monocytes 354 200 - 950 cells/uL   Eosinophils Absolute 71 15 - 500 cells/uL   Basophils Absolute 83 0 - 200 cells/uL   Neutrophils Relative % 50.2 %   Total Lymphocyte 41.2 %   Monocytes Relative 6.0 %   Eosinophils Relative 1.2 %   Basophils Relative 1.4 %   Smear Review    COMPLETE METABOLIC PANEL WITH GFR  Result Value Ref Range   Glucose, Bld 118 (H) 65 - 99 mg/dL   BUN 14 7 - 25 mg/dL   Creat 7.82 9.56 - 2.13 mg/dL   GFR, Est Non African American 67 > OR = 60 mL/min/1.4m2   GFR, Est African American 78 > OR =  60 mL/min/1.15m2   BUN/Creatinine Ratio NOT APPLICABLE 6 - 22 (calc)   Sodium 140 135 - 146 mmol/L   Potassium 4.1 3.5 -  5.3 mmol/L   Chloride 101 98 - 110 mmol/L   CO2 31 20 - 32 mmol/L   Calcium 9.8 8.6 - 10.4 mg/dL   Total Protein 7.4 6.1 - 8.1 g/dL   Albumin 4.5 3.6 - 5.1 g/dL   Globulin 2.9 1.9 - 3.7 g/dL (calc)   AG Ratio 1.6 1.0 - 2.5 (calc)   Total Bilirubin 0.3 0.2 - 1.2 mg/dL   Alkaline phosphatase (APISO) 74 33 - 130 U/L   AST 16 10 - 35 U/L   ALT 16 6 - 29 U/L  Lipid panel  Result Value Ref Range   Cholesterol 284 (H) <200 mg/dL   HDL 43 (L) >05 mg/dL   Triglycerides 397 (H) <150 mg/dL   LDL Cholesterol (Calc) 196 (H) mg/dL (calc)   Total CHOL/HDL Ratio 6.6 (H) <5.0 (calc)   Non-HDL Cholesterol (Calc) 241 (H) <130 mg/dL (calc)  TSH  Result Value Ref Range   TSH 0.70 0.40 - 4.50 mIU/L      Assessment & Plan:   Problem List Items Addressed This Visit      Cardiovascular and Mediastinum   Essential hypertension, benign (Chronic)    Patient not able to check BP at home; last BP reviewed, controlled; continue current medicines; refills provided; I explained that the most I can send to be dispensed at a time is 90 days; it is between her and her insurance company and her pharmacy as to when she can pick those up; for example, if she just got a 90 day supply on March 20th, they are probably not going to let her pick up another 90 day supply today; I can't make the insurance company cover her to get the medicines now that she'll be taking in July and August and September, e.g.; glad that she is avoiding salt; I advised her to AVOID Claritin-D, explained mechanism of action; okay to use plain Claritin, Rx sent      Relevant Medications   triamterene-hydrochlorothiazide (MAXZIDE-25) 37.5-25 MG tablet   amLODipine (NORVASC) 2.5 MG tablet   aspirin EC 81 MG tablet     Respiratory   Allergic conjunctivitis and rhinitis    Advised patient to NOT take Claritin-D, explained  the mechanism of decongestants, risk of stroke, heart attack; okay to use plain Claritin, Rx sent; also offered nasal corticosteroid        Other   RBC microcytosis    Patient has seen hematologist, 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; stable and chronic      HLD (hyperlipidemia)    Explained high cholesterol is linked to heart attacks and strokes; she was told she could have side effects from the pharmacist; she will talk to me one month later to see if she is willing to consider taking a medicine to help lower her cholesterol; encouraged her to watch her diet; we talked about the fact that checking a lipid panel now would probably not be worth bringing her in the office (current COVID-19 pandemic) if she never started the statin, as her lipid panel would probably be about the same; she is also not willing to take medicine right now anyway, so it would not really help to have that data; explained that if she does decide to take medicine, then we would want to get a f/u lipid panel to see the change; if she keeps her insurance, that won't be a problem; however, if she loses her insurance I am certainly willing for her to find a FREE lipid screening through  a hospital or community center or [facility] for example and she can get me those numbers and I can advise her based on those; I am willing to work with her I explained      Relevant Medications   triamterene-hydrochlorothiazide (MAXZIDE-25) 37.5-25 MG tablet   amLODipine (NORVASC) 2.5 MG tablet   aspirin EC 81 MG tablet       Follow up plan: No follow-ups on file.  An after-visit summary was printed and given to the patient at check-out.  Please see the patient instructions which may contain other information and recommendations beyond what is mentioned above in the assessment and plan.  Meds ordered this encounter  Medications  . loratadine (CLARITIN) 10 MG tablet    Sig: Take 1 tablet (10 mg total) by mouth daily as needed for allergies.     Dispense:  90 tablet    Refill:  3  . triamterene-hydrochlorothiazide (MAXZIDE-25) 37.5-25 MG tablet    Sig: Take 1 tablet by mouth daily.    Dispense:  90 tablet    Refill:  1    appt please  . amLODipine (NORVASC) 2.5 MG tablet    Sig: Take 1 tablet (2.5 mg total) by mouth daily.    Dispense:  90 tablet    Refill:  1  . fluticasone (FLONASE) 50 MCG/ACT nasal spray    Sig: Place 2 sprays into both nostrils daily.    Dispense:  48 g    Refill:  3  . aspirin EC 81 MG tablet    Sig: Take 1 tablet (81 mg total) by mouth daily.    No orders of the defined types were placed in this encounter.

## 2019-02-06 NOTE — Assessment & Plan Note (Addendum)
Explained high cholesterol is linked to heart attacks and strokes; she was told she could have side effects from the pharmacist; she will talk to me one month later to see if she is willing to consider taking a medicine to help lower her cholesterol; encouraged her to watch her diet; we talked about the fact that checking a lipid panel now would probably not be worth bringing her in the office (current COVID-19 pandemic) if she never started the statin, as her lipid panel would probably be about the same; she is also not willing to take medicine right now anyway, so it would not really help to have that data; explained that if she does decide to take medicine, then we would want to get a f/u lipid panel to see the change; if she keeps her insurance, that won't be a problem; however, if she loses her insurance I am certainly willing for her to find a FREE lipid screening through a hospital or community center or [facility] for example and she can get me those numbers and I can advise her based on those; I am willing to work with her I explained

## 2019-02-06 NOTE — Assessment & Plan Note (Signed)
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

## 2019-02-06 NOTE — Assessment & Plan Note (Signed)
Advised patient to NOT take Claritin-D, explained the mechanism of decongestants, risk of stroke, heart attack; okay to use plain Claritin, Rx sent; also offered nasal corticosteroid

## 2019-02-06 NOTE — Assessment & Plan Note (Signed)
Patient not able to check BP at home; last BP reviewed, controlled; continue current medicines; refills provided; I explained that the most I can send to be dispensed at a time is 90 days; it is between her and her insurance company and her pharmacy as to when she can pick those up; for example, if she just got a 90 day supply on March 20th, they are probably not going to let her pick up another 90 day supply today; I can't make the insurance company cover her to get the medicines now that she'll be taking in July and August and September, e.g.; glad that she is avoiding salt; I advised her to AVOID Claritin-D, explained mechanism of action; okay to use plain Claritin, Rx sent

## 2019-02-08 ENCOUNTER — Ambulatory Visit: Payer: No Typology Code available for payment source | Admitting: Family Medicine

## 2019-05-20 ENCOUNTER — Telehealth: Payer: Self-pay

## 2019-05-20 NOTE — Telephone Encounter (Signed)
Patient would like antibiotic without visit- unknown to this provider.

## 2019-05-20 NOTE — Telephone Encounter (Signed)
Please provider her with alternate options to be seen- can be billed later here or with urgent care. Can offer [facility]. A visit would be necessary for potentially prescribing antibiotics.

## 2019-05-20 NOTE — Telephone Encounter (Signed)
Pt states in the past Dr. Sanda Klein has prescribed an antibiotic for this. Told patient that for an antibiotic she would need a virtual visit. Pt stated due to [place], her job shut down and her insurance went out last month and she cannot afford a visit. Please advise.    Copied from [place] [phone]. Topic: General - Other >> May 20, 2019 11:29 AM Rainey Pines A wrote: Patient would like a callback from nurse in regards to her allergy medication

## 2019-05-20 NOTE — Telephone Encounter (Signed)
Provided patient with all options and she asked that you call her. Please advise.

## 2019-06-03 ENCOUNTER — Other Ambulatory Visit: Payer: Self-pay | Admitting: Family Medicine

## 2019-06-03 ENCOUNTER — Ambulatory Visit (INDEPENDENT_AMBULATORY_CARE_PROVIDER_SITE_OTHER): Payer: No Typology Code available for payment source | Admitting: Family Medicine

## 2019-06-03 ENCOUNTER — Encounter: Payer: Self-pay | Admitting: Family Medicine

## 2019-06-03 ENCOUNTER — Other Ambulatory Visit: Payer: Self-pay

## 2019-06-03 DIAGNOSIS — R058 Other specified cough: Secondary | ICD-10-CM

## 2019-06-03 DIAGNOSIS — R05 Cough: Secondary | ICD-10-CM

## 2019-06-03 MED ORDER — HYDROCOD POLST-CPM POLST ER 10-8 MG/5ML PO SUER: 5.0000 mL | Freq: Two times a day (BID) | ORAL | 0 refills | Status: DC | PRN

## 2019-06-03 MED ORDER — BENZONATATE 100 MG PO CAPS: 100.0000 mg | ORAL_CAPSULE | Freq: Two times a day (BID) | ORAL | 0 refills | Status: DC | PRN

## 2019-06-03 NOTE — Progress Notes (Signed)
Name: Vanessa Gross   MRN: [id number]    DOB: 10-15-1956   Date:06/03/2019       Progress Note  Subjective  Chief Complaint  Chief Complaint  Patient presents with  . Cough    cough x 8 days. Cough is productive with yellow mucous. She was evaluated at the Urgent Care and prescribed tussonex and benzonatate and bromide nasal spray. She only had a 7 day supply. Medication was working until she ran out.  . Nasal Congestion  . Fever    Because of chapped lips  . Bronchitis    She has had bronchitis before.    I connected with  Vanessa Gross on 06/03/19 at  3:40 PM EDT by telephone and verified that I am speaking with the correct person using two identifiers.  I discussed the limitations, risks, security and privacy concerns of performing an evaluation and management service by telephone and the availability of in person appointments. Staff also discussed with the patient that there may be a patient responsible charge related to this service. Patient Location: at home Provider Location: [facility]   HPI  Cough: she lost her job and her insurance due to COVID-19 , she states symptoms of cough followed by sputum production started about 8 days ago, she went to Urgent care and was given with nasal spray, two type of cough medications and doxycycline, she is frustrated because she ran out of medication two days ago and cough is back to baseline again, no tenderness on face , lips are chapped. Never smokes, denies SOB or wheezing, she has nasal drainage but no post-nasal drainage.   Patient Active Problem List   Diagnosis Date Noted  . Preventative health care 10/25/2018  . Controlled substance agreement terminated 05/26/2017  . BPPV (benign paroxysmal positional vertigo) 12/25/2016  . 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 Obesity 06/07/2016  . Medication monitoring encounter 06/07/2016  . Allergic conjunctivitis and rhinitis  10/19/2015  . Shortness of breath dyspnea 06/30/2015  . Anxiety 05/29/2015  . Reflux esophagitis 05/29/2015  . Alopecia 05/29/2015  . Hemorrhoids 05/29/2015  . HLD (hyperlipidemia) 05/29/2015  . Insomnia 05/29/2015  . Menopausal and perimenopausal disorder 05/29/2015  . Surgical or procedure not carried out because of patient's decision 05/29/2015  . Pain and swelling of left ankle 04/15/2015  . Nerve root pain 11/26/2009  . Bilateral edema of lower extremity 12/01/2008  . Essential hypertension, benign 05/25/2007  . 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 ABDOMINAL HYSTERECTOMY    . BREAST BIOPSY Left    benign  . TONSILLECTOMY AND ADENOIDECTOMY      Family History  Problem Relation Age of Onset  . Hypertension Mother   . Cancer Mother        lpymphoma  . Stroke Maternal Grandmother   . Stroke Maternal Grandfather   . Breast cancer Cousin     Social History   Socioeconomic History  . Marital status: Married    Spouse name: Gerri SporeWesley   . Number of children: 3  . Years of education: 10  . Highest education level: GED or equivalent  Occupational History  . Not on file  Social Needs  . Financial resource strain: Somewhat hard  . Food insecurity    Worry: Never true    Inability: Never true  . Transportation needs    Medical: No  Non-medical: No  Tobacco Use  . Smoking status: Never Smoker  . Smokeless tobacco: Never Used  Substance and Sexual Activity  . Alcohol use: No    Alcohol/week: 0.0 standard drinks  . Drug use: No  . Sexual activity: Not Currently  Lifestyle  . Physical activity    Days per week: 7 days    Minutes per session: 10 min  . Stress: To some extent  Relationships  . Social connections    Talks on phone: More than three times a week    Gets together: Once a week    Attends religious service: More than 4 times per year    Active member of  club or organization: No    Attends meetings of clubs or organizations: Never    Relationship status: Married  . Intimate partner violence    Fear of current or ex partner: No    Emotionally abused: No    Physically abused: No    Forced sexual activity: No  Other Topics Concern  . 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  amLODipine (NORVASC) 2.5 MG tablet, Take 1 tablet (2.5 mg total) by mouth daily., Disp: 90 tablet, Rfl: 1 .  aspirin EC 81 MG tablet, Take 1 tablet (81 mg total) by mouth daily., Disp: , Rfl:  .  benzonatate (TESSALON) 100 MG capsule, Take 1-2 capsules (100-200 mg total) by mouth 2 (two) times daily as needed., Disp: 60 capsule, Rfl: 0 .  BIOTIN PO, Take 1 tablet by mouth daily., Disp: , Rfl:  .  chlorpheniramine-HYDROcodone (TUSSIONEX PENNKINETIC ER) 10-8 MG/5ML SUER, Take 5 mLs by mouth every 12 (twelve) hours as needed., Disp: 140 mL, Rfl: 0 .  Cholecalciferol (D3-1000) 1000 units tablet, Take 1,000 Units by mouth daily., Disp: , Rfl:  .  fluticasone (FLONASE) 50 MCG/ACT nasal spray, Place 2 sprays into both nostrils daily., Disp: 48 g, Rfl: 3 .  loratadine (CLARITIN) 10 MG tablet, Take 1 tablet (10 mg total) by mouth daily as needed for allergies., Disp: 90 tablet, Rfl: 3 .  Multiple Vitamin (MULTI-VITAMINS) TABS, Take by mouth., Disp: , Rfl:  .  naproxen sodium (ANAPROX) 220 MG tablet, Take 220 mg by mouth as needed., Disp: , Rfl:  .  triamterene-hydrochlorothiazide (MAXZIDE-25) 37.5-25 MG tablet, Take 1 tablet by mouth daily., Disp: 90 tablet, Rfl: 1  Allergies  Allergen Reactions  . Celecoxib   . Gabapentin     abnormal dreams    I personally reviewed active problem list, medication list, allergies, family history, social history with the patient/caregiver today.   ROS  Ten systems reviewed and is negative except as mentioned in HPI  Objective  Virtual encounter,  vitals not obtained.  There is no height or weight on file to calculate BMI.  Physical Exam  Awake, alert and oriented  PHQ2/9: Depression screen [facility] 06/03/2019 02/06/2019 10/25/2018 01/12/2018 05/16/2017  Decreased Interest 0 0 0 0 0  Down, Depressed, Hopeless 0 0 0 1 0  PHQ - 2 Score 0 0 0 1 0  Altered sleeping 0 0 0 - -  Tired, decreased energy 0 0 0 - -  Change in appetite 0 0 0 - -  Feeling bad or failure about yourself  0 0 0 - -  Trouble concentrating 0 0 0 - -  Moving slowly or fidgety/restless 0 0 0 - -  Suicidal thoughts 0 0 0 - -  PHQ-9 Score 0 0 0 - -  Difficult doing work/chores - Not difficult at all Not difficult at all - -   PHQ-2/9 Result is negative.    Fall Risk: Fall Risk  06/03/2019 02/06/2019 10/25/2018 07/20/2018 01/12/2018  Falls in the past year? 0 0 0 No No  Number falls in past yr: 0 - - - -  Injury with Fall? 0 - - - -     Assessment & Plan   1. Productive cough  Discussed COVID-19 testing, but she states she cannot afford it, she was feeling better with medications given by urgent  Care and just wants a refill, we will hold off on sending more antibiotics but advised her to call back if needed. Also explained that she may be able to get COVID-19 testing for free [facility] and to self isolate for at least 10 days or no fever for 24 hours without medication and symptoms improving. - chlorpheniramine-HYDROcodone (TUSSIONEX PENNKINETIC ER) 10-8 MG/5ML SUER; Take 5 mLs by mouth every 12 (twelve) hours as needed.  Dispense: 140 mL; Refill: 0 - benzonatate (TESSALON) 100 MG capsule; Take 1-2 capsules (100-200 mg total) by mouth 2 (two) times daily as needed.  Dispense: 60 capsule; Refill: 0  I discussed the assessment and treatment plan with the patient. The patient was provided an opportunity to ask questions and all were answered. The patient agreed with the plan and demonstrated an understanding of the instructions.   The patient was advised  to call back or seek an in-person evaluation if the symptoms worsen or if the condition fails to improve as anticipated.  I provided 15  minutes of non-face-to-face time during this encounter.  Loistine Chance, MD

## 2019-06-04 ENCOUNTER — Ambulatory Visit: Payer: No Typology Code available for payment source | Admitting: Nurse Practitioner

## 2019-06-04 ENCOUNTER — Telehealth: Payer: Self-pay | Admitting: Family Medicine

## 2019-06-04 DIAGNOSIS — R05 Cough: Secondary | ICD-10-CM

## 2019-06-04 DIAGNOSIS — R058 Other specified cough: Secondary | ICD-10-CM

## 2019-06-04 NOTE — Telephone Encounter (Signed)
Spoke with patient and printed out another [facility]ex [facility] and she was unable to take the coupon over the phone and with no insurance the cough medication was going to cost $75 dollars. [facility] states it should only cost her $30.

## 2019-06-04 NOTE — Telephone Encounter (Signed)
chlorpheniramine-HYDROcodone (TUSSIONEX PENNKINETIC ER) 10-8 MG/5ML SUER     Patient states that this medication is too expensive. She inquired if office had any coupons, ect to bring down the price. Patient has contacted multiple pharmacies to see if she could find a lower price.

## 2019-06-20 NOTE — Telephone Encounter (Signed)
Relation to pt: self   Call back number: [phone]  [id number] [place], [place]   Reason for call:  Patient states cough slightly improved but had a horrible cough flare up today, patient states PCP advised if she was in need of another refill to call the office. Patient states would like [facility] Coupon and chlorpheniramine-HYDROcodone (TUSSIONEX PENNKINETIC ER) 10-8 MG/5ML SUER faxed to pharmacy, patient would like a follow up call, please advise

## 2019-06-21 ENCOUNTER — Other Ambulatory Visit: Payer: Self-pay | Admitting: Family Medicine

## 2019-06-21 DIAGNOSIS — R058 Other specified cough: Secondary | ICD-10-CM

## 2019-06-21 DIAGNOSIS — R05 Cough: Secondary | ICD-10-CM

## 2019-06-21 MED ORDER — HYDROCOD POLST-CPM POLST ER 10-8 MG/5ML PO SUER: 5.0000 mL | Freq: Two times a day (BID) | ORAL | 0 refills | Status: DC | PRN

## 2019-06-24 ENCOUNTER — Other Ambulatory Visit: Payer: Self-pay | Admitting: Family Medicine

## 2019-06-24 ENCOUNTER — Telehealth: Payer: Self-pay | Admitting: Family Medicine

## 2019-06-24 ENCOUNTER — Telehealth: Payer: Self-pay

## 2019-06-24 DIAGNOSIS — R058 Other specified cough: Secondary | ICD-10-CM

## 2019-06-24 DIAGNOSIS — R05 Cough: Secondary | ICD-10-CM

## 2019-06-24 MED ORDER — HYDROCOD POLST-CPM POLST ER 10-8 MG/5ML PO SUER: 5.0000 mL | Freq: Two times a day (BID) | ORAL | 0 refills | Status: DC | PRN

## 2019-06-24 NOTE — Telephone Encounter (Signed)
[facility] [address]. Prescription can't be transferred because it is a schedule II drug. Patient has been waiting on rx.

## 2019-06-24 NOTE — Telephone Encounter (Signed)
Medication: chlorpheniramine-HYDROcodone [facility] ER) 10-8 MG/5ML Latanya Presser [[id number]] - Medication was sent to the wrong pharmacy. Please send the medication to the below pharmacy. The patient is extremely upset that she has been waiting on the medication since last week. Please advise  Has the patient contacted their pharmacy? Yes  (Agent: If no, request that the patient contact the pharmacy for the refill.) (Agent: If yes, when and what did the pharmacy advise?)  Preferred Pharmacy (with phone number or street name[facility] [place] [place] [address] [phone]  Agent: Please be advised that RX refills may take up to 3 business days. We ask that you follow-up with your pharmacy.

## 2019-06-24 NOTE — Progress Notes (Unsigned)
Rx canceled at [facility]. [facility] [address] [facility].

## 2019-06-25 NOTE — Telephone Encounter (Signed)
It should be at the pharmacy she sent it in yesterday. She sent to wrong pharmacy the first time.

## 2019-07-09 NOTE — Telephone Encounter (Signed)
Erroneous Encounter

## 2019-08-26 ENCOUNTER — Other Ambulatory Visit: Payer: Self-pay | Admitting: Family Medicine

## 2019-08-26 DIAGNOSIS — I10 Essential (primary) hypertension: Secondary | ICD-10-CM

## 2019-08-26 MED ORDER — TRIAMTERENE-HCTZ 37.5-25 MG PO TABS: 1.0000 | ORAL_TABLET | Freq: Every day | ORAL | 0 refills | Status: DC

## 2019-08-26 MED ORDER — AMLODIPINE BESYLATE 2.5 MG PO TABS: 2.5000 mg | ORAL_TABLET | Freq: Every day | ORAL | 0 refills | Status: DC

## 2019-08-26 NOTE — Telephone Encounter (Signed)
Please schedule patient for follow up in the next 30 days.  

## 2019-08-26 NOTE — Telephone Encounter (Signed)
amLODipine (NORVASC) 2.5 MG tablet  triamterene-hydrochlorothiazide (MAXZIDE-25) 37.5-25 MG tablet     Patient is requesting refills.    [facility] [place] [facility] [place] [phone] (Phone) [phone] (Fax)

## 2019-08-26 NOTE — Telephone Encounter (Signed)
lvm to sch apt

## 2019-10-02 ENCOUNTER — Ambulatory Visit: Payer: Self-pay | Admitting: Family Medicine

## 2019-10-02 ENCOUNTER — Encounter: Payer: Self-pay | Admitting: Family Medicine

## 2019-10-02 ENCOUNTER — Other Ambulatory Visit: Payer: Self-pay

## 2019-10-02 VITALS — BP 130/82 | HR 104 | Temp 98.9°F | Resp 14 | Ht 65.0 in | Wt 195.9 lb

## 2019-10-02 DIAGNOSIS — R21 Rash and other nonspecific skin eruption: Secondary | ICD-10-CM

## 2019-10-02 MED ORDER — PREDNISONE 20 MG PO TABS: ORAL_TABLET | ORAL | 0 refills | Status: DC

## 2019-10-02 MED ORDER — HYDROXYZINE HCL 10 MG PO TABS: 10.0000 mg | ORAL_TABLET | Freq: Three times a day (TID) | ORAL | 1 refills | Status: DC | PRN

## 2019-10-02 MED ORDER — HYDROXYZINE HCL 10 MG PO TABS: 10.0000 mg | ORAL_TABLET | Freq: Three times a day (TID) | ORAL | 0 refills | Status: DC | PRN

## 2019-10-02 NOTE — Progress Notes (Signed)
Patient ID: Vanessa Gross, female    DOB: 1956/02/17, 63 y.o.   MRN: [id number]  PCP: Arnetha Courser, MD  Chief Complaint  Patient presents with  . Follow-up  . Medication Refill  . Hypertension  . Rash    all over onset 6 days ago, red bumps with itching    Subjective:   Vanessa Gross is a 63 y.o. female, presents to clinic with CC of the following:  Rash This is a new problem. The current episode started yesterday. The problem has been gradually worsening since onset. The rash is diffuse. The rash is characterized by redness, itchiness and peeling. She was exposed to a new detergent/soap (possibly a new detergent for her husbands clothes but nothing else). Pertinent negatives include no anorexia, congestion, cough, diarrhea, eye pain, facial edema, fatigue, fever, joint pain, nail changes, rhinorrhea, shortness of breath or sore throat. Past treatments include moisturizer. The treatment provided no relief. There is no history of allergies, asthma, eczema or varicella.      Patient Active Problem List   Diagnosis Date Noted  . Preventative health care 10/25/2018  . Controlled substance agreement terminated 05/26/2017  . BPPV (benign paroxysmal positional vertigo) 12/25/2016  . 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 Obesity 06/07/2016  . Medication monitoring encounter 06/07/2016  . Allergic conjunctivitis and rhinitis 10/19/2015  . Shortness of breath dyspnea 06/30/2015  . Anxiety 05/29/2015  . Reflux esophagitis 05/29/2015  . Alopecia 05/29/2015  . Hemorrhoids 05/29/2015  . HLD (hyperlipidemia) 05/29/2015  . Insomnia 05/29/2015  . Menopausal and perimenopausal disorder 05/29/2015  . Surgical or procedure not carried out because of patient's decision 05/29/2015  . Pain and swelling of left ankle 04/15/2015  . Nerve root pain 11/26/2009  . Bilateral edema of lower extremity 12/01/2008  . Essential hypertension, benign 05/25/2007  .  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  amLODipine (NORVASC) 2.5 MG tablet, Take 1 tablet (2.5 mg total) by mouth daily., Disp: 90 tablet, Rfl: 0 .  aspirin EC 81 MG tablet, Take 1 tablet (81 mg total) by mouth daily., Disp: , Rfl:  .  benzonatate (TESSALON) 100 MG capsule, Take 1-2 capsules (100-200 mg total) by mouth 2 (two) times daily as needed., Disp: 60 capsule, Rfl: 0 .  BIOTIN PO, Take 1 tablet by mouth daily., Disp: , Rfl:  .  chlorpheniramine-HYDROcodone (TUSSIONEX PENNKINETIC ER) 10-8 MG/5ML SUER, Take 5 mLs by mouth every 12 (twelve) hours as needed., Disp: 140 mL, Rfl: 0 .  Cholecalciferol (D3-1000) 1000 units tablet, Take 1,000 Units by mouth daily., Disp: , Rfl:  .  fluticasone (FLONASE) 50 MCG/ACT nasal spray, Place 2 sprays into Gross nostrils daily., Disp: 48 g, Rfl: 3 .  loratadine (CLARITIN) 10 MG tablet, Take 1 tablet (10 mg total) by mouth daily as needed for allergies., Disp: 90 tablet, Rfl: 3 .  Multiple Vitamin (MULTI-VITAMINS) TABS, Take by mouth., Disp: , Rfl:  .  naproxen sodium (ANAPROX) 220 MG tablet, Take 220 mg by mouth as needed., Disp: , Rfl:  .  triamterene-hydrochlorothiazide (MAXZIDE-25) 37.5-25 MG tablet, Take 1 tablet by mouth daily., Disp: 90 tablet, Rfl: 0   Allergies  Allergen Reactions  . Celecoxib   . Gabapentin     abnormal dreams     Family History  Problem Relation Age of Onset  . Hypertension Mother   . Cancer Mother  lpymphoma  . Stroke Maternal Grandmother   . Stroke Maternal Grandfather   . Breast cancer Cousin      Social History   Socioeconomic History  . Marital status: Married    Spouse name: Gerri SporeWesley   . Number of children: 3  . Years of education: 10  . Highest education level: GED or equivalent  Occupational History  . Not on file  Social Needs  . Financial resource strain: Somewhat hard  . Food insecurity    Worry: Never true     Inability: Never true  . Transportation needs    Medical: No    Non-medical: No  Tobacco Use  . Smoking status: Never Smoker  . Smokeless tobacco: Never Used  Substance and Sexual Activity  . Alcohol use: No    Alcohol/week: 0.0 standard drinks  . Drug use: No  . Sexual activity: Not Currently  Lifestyle  . Physical activity    Days per week: 7 days    Minutes per session: 10 min  . Stress: To some extent  Relationships  . Social connections    Talks on phone: More than three times a week    Gets together: Once a week    Attends religious service: More than 4 times per year    Active member of club or organization: No    Attends meetings of clubs or organizations: Never    Relationship status: Married  . Intimate partner violence    Fear of current or ex partner: No    Emotionally abused: No    Physically abused: No    Forced sexual activity: No  Other Topics Concern  . 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   Review of Systems  Constitutional: Negative.  Negative for fatigue and fever.  HENT: Negative.  Negative for congestion, rhinorrhea and sore throat.   Eyes: Negative.  Negative for pain.  Respiratory: Negative.  Negative for cough and shortness of breath.   Cardiovascular: Negative.   Gastrointestinal: Negative.  Negative for anorexia and diarrhea.  Endocrine: Negative.   Genitourinary: Negative.   Musculoskeletal: Negative.  Negative for joint pain.  Skin: Positive for rash. Negative for nail changes.  Allergic/Immunologic: Negative.   Neurological: Negative.   Hematological: Negative.   Psychiatric/Behavioral: Negative.   All other systems reviewed and are negative.      Objective:   Vitals:    10/02/19 1503  BP: 130/82  Pulse: (!) 104  Resp: 14  Temp: 98.9 F (37.2 C)  SpO2: 98%  Weight: 195 lb 14.4 oz (88.9 kg)  Height: 5\' 5"  (1.651 m)    Body mass index is 32.6 kg/m.  Physical Exam Vitals signs and nursing note reviewed.  Constitutional:      General: She is not in acute distress.    Appearance: Normal appearance. She is well-developed. She is not ill-appearing, toxic-appearing or diaphoretic.  HENT:     Head: Normocephalic and atraumatic.     Right Ear: External ear normal.     Left Ear: External ear normal.     Nose: Nose normal.     Mouth/Throat:     Mouth: Mucous membranes are moist.     Pharynx: Oropharynx is clear. No oropharyngeal exudate or posterior oropharyngeal erythema.  Eyes:     General:        Right eye: No discharge.        Left eye: No discharge.     Conjunctiva/sclera: Conjunctivae normal.  Neck:     Trachea: No tracheal deviation.  Cardiovascular:     Rate and Rhythm: Normal rate and regular rhythm.  Pulmonary:     Effort: Pulmonary effort is normal. No respiratory distress.     Breath sounds: No stridor.  Musculoskeletal: Normal range of motion.  Skin:    General: Skin is warm and dry.     Findings: Rash (scattered papular rash to all extremities and torso, all round less than 5 mm) present.  Neurological:     Mental Status: She is alert.     Motor: No abnormal muscle tone.     Coordination: Coordination normal.  Psychiatric:        Mood and Affect: Mood normal.        Behavior: Behavior normal.      Results for orders placed or performed in visit on 10/25/18  CBC with Differential/Platelet  Result Value Ref Range   WBC 5.9 3.8 - 10.8 Thousand/uL   RBC 6.70 (H) 3.80 - 5.10 Million/uL   Hemoglobin 14.0 11.7 - 15.5 g/dL   HCT 61.6 (H) 07.3 - 71.0 %   MCV 67.5 (L) 80.0 - 100.0 fL   MCH 20.9 (L) 27.0 - 33.0 pg   MCHC 31.0 (L) 32.0 - 36.0 g/dL   RDW 62.6 (H) 94.8 - 54.6 %   Platelets 322 140 - 400 Thousand/uL   MPV 11.5 7.5 -  12.5 fL   Neutro Abs 2,962 1,500 - 7,800 cells/uL   Lymphs Abs 2,431 850 - 3,900 cells/uL   Absolute Monocytes 354 200 - 950 cells/uL   Eosinophils Absolute 71 15 - 500 cells/uL   Basophils Absolute 83 0 - 200 cells/uL   Neutrophils Relative % 50.2 %   Total Lymphocyte 41.2 %   Monocytes Relative 6.0 %   Eosinophils Relative 1.2 %   Basophils Relative 1.4 %   Smear Review    COMPLETE METABOLIC PANEL WITH GFR  Result Value Ref Range   Glucose, Bld 118 (H) 65 - 99 mg/dL   BUN 14 7 - 25 mg/dL   Creat 2.70 3.50 - 0.93 mg/dL   GFR, Est Non African American 67 > OR = 60 mL/min/1.54m2   GFR, Est African American 78 > OR = 60 mL/min/1.6m2   BUN/Creatinine Ratio NOT APPLICABLE 6 - 22 (calc)   Sodium 140 135 - 146 mmol/L   Potassium 4.1 3.5 - 5.3 mmol/L   Chloride 101 98 - 110 mmol/L   CO2 31 20 - 32 mmol/L   Calcium 9.8 8.6 - 10.4 mg/dL   Total Protein 7.4 6.1 - 8.1 g/dL   Albumin 4.5 3.6 - 5.1 g/dL   Globulin 2.9 1.9 - 3.7 g/dL (calc)   AG Ratio 1.6 1.0 - 2.5 (calc)   Total Bilirubin 0.3 0.2 - 1.2 mg/dL   Alkaline phosphatase (APISO) 74 33 - 130 U/L   AST 16 10 - 35 U/L   ALT 16 6 - 29 U/L  Lipid panel  Result Value Ref Range   Cholesterol 284 (H) <200 mg/dL   HDL 43 (L) >81 mg/dL   Triglycerides 829 (H) <150 mg/dL   LDL Cholesterol (Calc) 196 (H) mg/dL (calc)   Total CHOL/HDL Ratio 6.6 (H) <5.0 (calc)   Non-HDL Cholesterol (Calc) 241 (H) <130  mg/dL (calc)  TSH  Result Value Ref Range   TSH 0.70 0.40 - 4.50 mIU/L        Assessment & Plan:      ICD-10-CM   1. Rash and nonspecific skin eruption  R21   Possibly a contact dermatitis in the early stage  encouraged her to use gentle moisturizer such as creams or ointments like Vaseline, start an antihistamine like Benadryl or hydroxyzine.  It was prescribed for her and also printed she is cash pay Encouraged her to use antihistamine for the next several days also to add Pepcid and if the rash is spreading or worsening she  was instructed to start a prednisone taper    Danelle Berry, PA-C 10/02/19 2:12 PM

## 2019-10-02 NOTE — Patient Instructions (Addendum)
Use the hydroxyzine 10 mg three times a day for the next several days to help decrease the itching and the spread of rash.  Get and take pepcid over the counter 20 mg - take one by mouth today and then again for the next couple days in the morning for the next couple days.  If your rash is spreading or getting worse, THEN start the steroid - prednisone - AND continue the other two medicines.  Follow up if its not going away in the next 1-2 weeks   Contact Dermatitis Dermatitis is redness, soreness, and swelling (inflammation) of the skin. Contact dermatitis is a reaction to something that touches the skin. There are two types of contact dermatitis:  Irritant contact dermatitis. This happens when something bothers (irritates) your skin, like soap.  Allergic contact dermatitis. This is caused when you are exposed to something that you are allergic to, such as poison ivy. What are the causes?  Common causes of irritant contact dermatitis include: ? Makeup. ? Soaps. ? Detergents. ? Bleaches. ? Acids. ? Metals, such as nickel.  Common causes of allergic contact dermatitis include: ? Plants. ? Chemicals. ? Jewelry. ? Latex. ? Medicines. ? Preservatives in products, such as clothing. What increases the risk?  Having a job that exposes you to things that bother your skin.  Having asthma or eczema. What are the signs or symptoms? Symptoms may happen anywhere the irritant has touched your skin. Symptoms include:  Dry or flaky skin.  Redness.  Cracks.  Itching.  Pain or a burning feeling.  Blisters.  Blood or clear fluid draining from skin cracks. With allergic contact dermatitis, swelling may occur. This may happen in places such as the eyelids, mouth, or genitals. How is this treated?  This condition is treated by checking for the cause of the reaction and protecting your skin. Treatment may also include: ? Steroid creams, ointments, or medicines. ? Antibiotic  medicines or other ointments, if you have a skin infection. ? Lotion or medicines to help with itching. ? A bandage (dressing). Follow these instructions at home: Skin care  Moisturize your skin as needed.  Put cool cloths on your skin.  Put a baking soda paste on your skin. Stir water into baking soda until it looks like a paste.  Do not scratch your skin.  Avoid having things rub up against your skin.  Avoid the use of soaps, perfumes, and dyes. Medicines  Take or apply over-the-counter and prescription medicines only as told by your doctor.  If you were prescribed an antibiotic medicine, take or apply it as told by your doctor. Do not stop using it even if your condition starts to get better. Bathing  Take a bath with: ? Epsom salts. ? Baking soda. ? Colloidal oatmeal.  Bathe less often.  Bathe in warm water. Avoid using hot water. Bandage care  If you were given a bandage, change it as told by your health care provider.  Wash your hands with soap and water before and after you change your bandage. If soap and water are not available, use hand sanitizer. General instructions  Avoid the things that caused your reaction. If you do not know what caused it, keep a journal. Write down: ? What you eat. ? What skin products you use. ? What you drink. ? What you wear in the area that has symptoms. This includes jewelry.  Check the affected areas every day for signs of infection. Check for: ? More redness,  swelling, or pain. ? More fluid or blood. ? Warmth. ? Pus or a bad smell.  Keep all follow-up visits as told by your doctor. This is important. Contact a doctor if:  You do not get better with treatment.  Your condition gets worse.  You have signs of infection, such as: ? More swelling. ? Tenderness. ? More redness. ? Soreness. ? Warmth.  You have a fever.  You have new symptoms. Get help right away if:  You have a very bad headache.  You have neck  pain.  Your neck is stiff.  You throw up (vomit).  You feel very sleepy.  You see red streaks coming from the area.  Your bone or joint near the area hurts after the skin has healed.  The area turns darker.  You have trouble breathing. Summary  Dermatitis is redness, soreness, and swelling of the skin.  Symptoms may occur where the irritant has touched you.  Treatment may include medicines and skin care.  If you do not know what caused your reaction, keep a journal.  Contact a doctor if your condition gets worse or you have signs of infection. This information is not intended to replace advice given to you by your health care provider. Make sure you discuss any questions you have with your health care provider. Document Released: 08/21/2009 Document Revised: 02/13/2019 Document Reviewed: 05/09/2018 Elsevier Patient Education  2020 [employer].

## 2019-10-08 ENCOUNTER — Telehealth: Payer: Self-pay | Admitting: Family Medicine

## 2019-10-08 NOTE — Telephone Encounter (Signed)
Just fyi I called pt back to tell her she was not due yet.  She states that's fine but when she is due next month she can not come back in for an appointment.  She states yall talked about it at her rash visit and she does not have ins. To keep coming.

## 2019-10-08 NOTE — Telephone Encounter (Signed)
Pt asked if PCP can send refills to pharmacy so they are available when needed and in the pharmacies system. I advised pt of some request too early but she would like them sent anyway/ please advise   Medications  triamterene-hydrochlorothiazide (MAXZIDE-25) 37.5-25 MG tablet   loratadine (CLARITIN) 10 MG tablet  amLODipine (NORVASC) 2.5 MG tablet

## 2019-10-29 ENCOUNTER — Encounter: Payer: No Typology Code available for payment source | Admitting: Family Medicine

## 2019-10-30 ENCOUNTER — Telehealth: Payer: Self-pay | Admitting: Family Medicine

## 2019-10-30 NOTE — Telephone Encounter (Signed)
Pt had an appt on 10-02-2019 for a rash. Pt is calling and would like a refill on med patient has fresh rashes on her back. Pt would like more prednisone. [place] [address]. The other rash on back is healing

## 2019-11-04 NOTE — Telephone Encounter (Signed)
Pt will need appt dr. Andrey Cota med

## 2019-11-04 NOTE — Telephone Encounter (Signed)
Pt says that she was just in here in November for a rash. She currently isnt working and cannot afford another bill. Please advise

## 2019-11-20 ENCOUNTER — Other Ambulatory Visit: Payer: Self-pay | Admitting: Family Medicine

## 2019-11-20 DIAGNOSIS — I10 Essential (primary) hypertension: Secondary | ICD-10-CM

## 2020-01-09 ENCOUNTER — Ambulatory Visit: Payer: Self-pay | Attending: Internal Medicine

## 2020-01-09 DIAGNOSIS — Z23 Encounter for immunization: Secondary | ICD-10-CM | POA: Insufficient documentation

## 2020-01-09 NOTE — Progress Notes (Signed)
   Covid-19 Vaccination Clinic  Name:  ASLAN MONTAGNA    MRN: [id number] DOB: [birth year]  01/09/2020  Ms. Mizrahi was observed post Covid-19 immunization for 30 minutes based on pre-vaccination screening without incident. She was provided with Vaccine Information Sheet and instruction to access the V-Safe system.   Ms. Raney was instructed to call 911 with any severe reactions post vaccine: Marland Kitchen Difficulty breathing  . Swelling of face and throat  . A fast heartbeat  . A bad rash all over body  . Dizziness and weakness   Immunizations Administered    Name Date Dose VIS Date Route   Pfizer COVID-19 Vaccine 01/09/2020 10:45 AM 0.3 mL 10/18/2019 Intramuscular   Manufacturer: [employer], [employer]   Lot: [id number]   NDC: [address]-0

## 2020-01-27 ENCOUNTER — Telehealth: Payer: Self-pay

## 2020-01-28 ENCOUNTER — Ambulatory Visit: Payer: Self-pay

## 2020-01-29 ENCOUNTER — Encounter: Payer: Self-pay | Admitting: Family Medicine

## 2020-01-29 ENCOUNTER — Other Ambulatory Visit: Payer: Self-pay | Admitting: Family Medicine

## 2020-01-29 DIAGNOSIS — Z598 Other problems related to housing and economic circumstances: Secondary | ICD-10-CM

## 2020-01-29 DIAGNOSIS — I10 Essential (primary) hypertension: Secondary | ICD-10-CM

## 2020-01-29 DIAGNOSIS — Z5989 Other problems related to housing and economic circumstances: Secondary | ICD-10-CM

## 2020-01-29 NOTE — Telephone Encounter (Signed)
Hypertension medication request:  Last office visit pertaining to hypertension:04012020  BP Readings from Last 3 Encounters:  10/02/19 130/82  10/25/18 128/74  07/20/18 140/90    Lab Results  Component Value Date   CREATININE 0.92 10/25/2018   BUN 14 10/25/2018   NA 140 10/25/2018   K 4.1 10/25/2018   CL 101 10/25/2018   CO2 31 10/25/2018     No follow-ups on file.

## 2020-01-29 NOTE — Telephone Encounter (Signed)
I have reviewed the patient's chart and last several visits and encounters.  She was seen for a rash in the past couple months but has not had an encounter for hypertension since 02/06/2019.   BP Readings from Last 3 Encounters:  10/02/19 130/82  10/25/18 128/74  07/20/18 140/90   Her blood pressure at her visit in November was near goal but unfortunately patient did not do any blood work and we would need to recheck her renal function and electrolytes.  Patient has made several phone calls asking for multiple refills and has expressed that she does not have health insurance coverage for right now and so she cannot afford repeated visits or blood work.  I will be forwarding her chart to our clinic abdomen and chronic care management team to see if there is any way we can assist her with applying for insurance coverage or help her find a affordable place to get some of her primary care while she is needing monitoring but currently out of a job and uninsured (community health clinics?  Federally qualified health centers etc.)  I will also be ordering a BMP with [facility] and checking with lab to see how much it costs to call and give the pt the option of coming to do labs here so that I can safely monitor and give refills.    ICD-10-CM   1. Essential hypertension, benign  I10 triamterene-hydrochlorothiazide (MAXZIDE-25) 37.5-25 MG tablet [Pharmacy Med Name: Triamterene-HCTZ 37.5-25 MG Oral Tablet]    BASIC METABOLIC PANEL WITH GFR    Ambulatory referral to Chronic Care Management Services   on 2.5 mg norvasc, triamterene-HCTZ, last BP was well controlled, no recent labs, last were in 2019  2. Uninsured  Z59.8 Ambulatory referral to Chronic Care Management Services

## 2020-01-30 ENCOUNTER — Ambulatory Visit: Payer: Self-pay | Attending: Internal Medicine

## 2020-01-30 DIAGNOSIS — Z23 Encounter for immunization: Secondary | ICD-10-CM

## 2020-01-30 NOTE — Progress Notes (Signed)
   Covid-19 Vaccination Clinic  Name:  Vanessa Gross    MRN: [id number] DOB: 08-08-1956  01/30/2020  Vanessa Gross was observed post Covid-19 immunization for 15 minutes without incident. She was provided with Vaccine Information Sheet and instruction to access the V-Safe system.   Vanessa Gross was instructed to call 911 with any severe reactions post vaccine: Marland Kitchen Difficulty breathing  . Swelling of face and throat  . A fast heartbeat  . A bad rash all over body  . Dizziness and weakness   Immunizations Administered    Name Date Dose VIS Date Route   Pfizer COVID-19 Vaccine 01/30/2020 11:48 AM 0.3 mL 10/18/2019 Intramuscular   Manufacturer: [employer], [employer]   Lot: [id number]   NDC: [address]-8

## 2020-01-30 NOTE — Telephone Encounter (Signed)
Pt notified, states will come in for CMP $ 31.99. I also told her about [facility].  But patient did not seem interested.

## 2020-01-31 ENCOUNTER — Ambulatory Visit: Payer: Self-pay | Admitting: *Deleted

## 2020-01-31 NOTE — Telephone Encounter (Signed)
Thanks, sounds great.

## 2020-01-31 NOTE — Chronic Care Management (AMB) (Signed)
  Chronic Care Management   Note  01/31/2020 Name: DEMYA SCRUGGS MRN: [id number] DOB: 11-16-1955  Request for pharmacy medication assistance forwarded to [facility].   Follow up plan: [facility].    Marja Kays MHA,BSN,RN,CCM Lead Embedded Care Coordination Supervisor [facility] Care Management  [phone]

## 2020-02-18 ENCOUNTER — Telehealth: Payer: Self-pay | Admitting: Family Medicine

## 2020-02-18 ENCOUNTER — Ambulatory Visit: Payer: Self-pay

## 2020-02-18 NOTE — Telephone Encounter (Signed)
Pt lost insurance, no labs since 2019, I have only seen for rash in Nov 2020.  CCM and [facility] pharmacy has been in contact with pt who states she cannot afford OV or labs - hope to help with continuity of care  Pt could get free/low cost care at local community health centers or [facility] for f/up on HTN/meds/labs/HLD etc

## 2020-02-19 ENCOUNTER — Other Ambulatory Visit: Payer: Self-pay | Admitting: Family Medicine

## 2020-02-19 DIAGNOSIS — I10 Essential (primary) hypertension: Secondary | ICD-10-CM

## 2020-02-19 NOTE — Telephone Encounter (Signed)
Pt notified, states she was already notified a few weeks back and is going to come in to do vitals and labs

## 2020-02-24 ENCOUNTER — Telehealth: Payer: Self-pay | Admitting: Family Medicine

## 2020-02-24 LAB — BASIC METABOLIC PANEL WITH GFR
BUN: 12 mg/dL (ref 7–25)
CO2: 31 mmol/L (ref 20–32)
Calcium: 9.4 mg/dL (ref 8.6–10.4)
Chloride: 103 mmol/L (ref 98–110)
Creat: 0.87 mg/dL (ref 0.50–0.99)
GFR, Est African American: 82 mL/min/{1.73_m2} (ref >=60)
GFR, Est Non African American: 71 mL/min/{1.73_m2} (ref >=60)
Glucose, Bld: 102 mg/dL — ABNORMAL HIGH (ref 65–99)
Potassium: 4.5 mmol/L (ref 3.5–5.3)
Sodium: 141 mmol/L (ref 135–146)

## 2020-02-24 NOTE — Telephone Encounter (Signed)
Pt stopped by this morning and did her lab work. She is asking that you please go ahead and write her prescription for her allergy medication because she only have like three pills left.

## 2020-02-25 ENCOUNTER — Other Ambulatory Visit: Payer: Self-pay | Admitting: Family Medicine

## 2020-02-25 ENCOUNTER — Other Ambulatory Visit: Payer: Self-pay

## 2020-02-25 DIAGNOSIS — I10 Essential (primary) hypertension: Secondary | ICD-10-CM

## 2020-02-25 MED ORDER — AMLODIPINE BESYLATE 2.5 MG PO TABS: 2.5000 mg | ORAL_TABLET | Freq: Every day | ORAL | 3 refills | Status: DC

## 2020-02-25 MED ORDER — TRIAMTERENE-HCTZ 37.5-25 MG PO TABS: 1.0000 | ORAL_TABLET | Freq: Every day | ORAL | 3 refills | Status: DC

## 2020-02-25 NOTE — Telephone Encounter (Signed)
Pts BP was 130/68 @ 3pm today /please advise / Pt asked if she needs to come in tomorrow morning to have her BP taken again

## 2020-02-25 NOTE — Telephone Encounter (Signed)
BP was 130/68 and had labs done

## 2020-02-28 ENCOUNTER — Other Ambulatory Visit: Payer: Self-pay | Admitting: Family Medicine

## 2020-02-28 MED ORDER — LORATADINE 10 MG PO TABS: 10.0000 mg | ORAL_TABLET | Freq: Every day | ORAL | 1 refills | Status: DC | PRN

## 2020-02-28 NOTE — Telephone Encounter (Signed)
Copied from CRM [id number]. Topic: Quick Communication - Rx Refill/Question >> Feb 28, 2020  1:11 PM Mcneil, Ja-Kwan wrote: Medication: loratadine (CLARITIN) 10 MG tablet  Has the patient contacted their pharmacy? no  Preferred Pharmacy (with phone number or street name): [facility] [address] [place]), [place] - [address]  Phone: [phone]   Fax: [phone]  Agent: Please be advised that RX refills may take up to 3 business days. We ask that you follow-up with your pharmacy.

## 2020-03-24 ENCOUNTER — Ambulatory Visit: Payer: Self-pay

## 2020-03-27 ENCOUNTER — Telehealth: Payer: Self-pay | Admitting: Family Medicine

## 2020-03-27 NOTE — Telephone Encounter (Signed)
Medication Refill - Medication: flonase  Has the patient contacted their pharmacy? No. (Agent: If no, request that the patient contact the pharmacy for the refill.) (Agent: If yes, when and what did the pharmacy advise?)  Preferred Pharmacy (with phone number or street name): [facility]  [phone] Agent: Please be advised that RX refills may take up to 3 business days. We ask that you follow-up with your pharmacy.

## 2020-03-30 ENCOUNTER — Other Ambulatory Visit: Payer: Self-pay

## 2020-03-30 MED ORDER — FLUTICASONE PROPIONATE 50 MCG/ACT NA SUSP: 2.0000 | Freq: Every day | NASAL | 3 refills | Status: DC

## 2020-03-30 NOTE — Telephone Encounter (Signed)
Please send in °

## 2020-04-01 ENCOUNTER — Ambulatory Visit: Payer: Self-pay | Attending: Oncology | Admitting: *Deleted

## 2020-04-01 ENCOUNTER — Ambulatory Visit
Admission: RE | Admit: 2020-04-01 | Discharge: 2020-04-01 | Disposition: A | Discharge status: Home / Self Care | Payer: Self-pay | Source: Ambulatory Visit | Attending: Oncology | Admitting: Oncology

## 2020-04-01 ENCOUNTER — Other Ambulatory Visit: Payer: Self-pay

## 2020-04-01 VITALS — BP 138/80 | HR 81 | Temp 98.0°F | Ht 62.0 in | Wt 199.5 lb

## 2020-04-01 DIAGNOSIS — Z0000 Encounter for general adult medical examination without abnormal findings: Secondary | ICD-10-CM | POA: Insufficient documentation

## 2020-04-01 NOTE — Progress Notes (Signed)
  Subjective:     Patient ID: Barnett Hatter, female   DOB: [birth year], 64 y.o.   MRN: [id number]  HPI   Review of Systems     Objective:   Physical Exam Chest:     Breasts: Breasts are asymmetrical.        Right: No swelling, bleeding, inverted nipple, mass, nipple discharge, skin change or tenderness.        Left: No swelling, bleeding, inverted nipple, mass, nipple discharge, skin change or tenderness.     Comments: Left breast approximately 2 cup sizes larger than the right Lymphadenopathy:     Upper Body:     Right upper body: No supraclavicular or axillary adenopathy.     Left upper body: No supraclavicular or axillary adenopathy.        Assessment:     64 year old Black female presents to [facility] for clinical breast exam and mammogram only.  Clinical breast exam unremarkable.  Taught self breast awareness.  Patient with a history of hysterectomy.  Pap omitted per protocol.  Patient has been screened for eligibility.  She does not have any insurance, Medicare or Medicaid.  She also meets financial eligibility.   Risk Assessment    Risk Scores      04/01/2020   Last edited by: Scarlett Presto, RN   5-year risk: 1.6 %   Lifetime risk: 6.3 %            Plan:     Screening mammogram ordered.  Will follow up per [facility] protocol.

## 2020-04-02 ENCOUNTER — Encounter: Payer: Self-pay | Admitting: *Deleted

## 2020-04-02 NOTE — Progress Notes (Signed)
Letter mailed from [facility] to inform patient of her normal mammogram results.  Patient is to follow-up with annual screening in one year. 

## 2020-05-18 NOTE — Progress Notes (Deleted)
Patient is a 64 year old female patient of Vanessa Gross Last visit was in November 2020 for a rash, with that note reviewed. Follows up today with complaints of

## 2020-05-19 ENCOUNTER — Ambulatory Visit: Payer: Self-pay | Admitting: Internal Medicine

## 2020-06-26 ENCOUNTER — Telehealth: Payer: Self-pay

## 2020-06-26 ENCOUNTER — Telehealth: Payer: Self-pay | Admitting: Family Medicine

## 2020-06-26 NOTE — Telephone Encounter (Signed)
Patient calling to request RX for trouble sleeping. Patient states that this is not a new issue and has previously been discussed. Patient declined appointment.

## 2020-06-26 NOTE — Telephone Encounter (Signed)
errounous

## 2020-07-03 ENCOUNTER — Other Ambulatory Visit: Payer: Self-pay | Admitting: Family Medicine

## 2020-07-23 NOTE — Progress Notes (Signed)
Name: Vanessa Gross   MRN: [id number]    DOB: Feb 01, 1956   Date:07/24/2020       Progress Note  Chief Complaint  Patient presents with  . Follow-up    Hypertension  . Insomnia  . Hypertension     Subjective:   Vanessa Gross is a 64 y.o. female, presents to clinic for routine f/up  Hypertension:  Currently managed on Amlodopine 2.5 mg qd and Maxide 37.5/25mg  qd  Pt reports good med compliance and denies any SE.   Blood pressure today is well controlled. BP Readings from Last 3 Encounters:  07/24/20 118/80  04/01/20 138/80  10/02/19 130/82   Pt denies CP, SOB, exertional sx, LE edema, palpitation, Ha's, visual disturbances, lightheadedness, hypotension, syncope.  Patient has been on the same medications for some time but she did not have insurance for a while over the last year and a half.  She presented for refills was unable to afford blood work last year but eventually agreed to do a BMP which was done last April showed normal kidney function and electrolytes mildly elevated blood sugar at that time.  No other recent labs. In reviewing her chart and labs from a few years ago she does have history of hyperlipidemia with very elevated LDL last was done in 2019 with LDL 196 total cholesterol 342 We reviewed her ASCVD risk score  The 10-year ASCVD risk score Denman George DC Jr., et al., 2013) is: 9.9%   Values used to calculate the score:     Age: 89 years     Sex: Female     Is Non-Hispanic African American: Yes     Diabetic: No     Tobacco smoker: No     Systolic Blood Pressure: 118 mmHg     Is BP treated: Yes     HDL Cholesterol: 43 mg/dL     Total Cholesterol: 284 mg/dL  No family hx of heart attack or stroke or familial HLD that she knows of She has not been on a statin in the past.  She denies any exertional chest pain shortness of breath, near syncope, denies any claudication symptoms in her legs though she does have some venous stasis intermittent swelling and some  chronic skin changes to her lower extremities  She also has a history of thalassemia which Dr. 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 Donneta Romberg - f/up with pt was last done in 2017 -electrophoresis was normal and patient was asymptomatic he diagnosed with RBC microcytosis with MCV that was around 66 with normal hemoglobin and normal iron studies  Obesity: BMI of 36 with associated comorbidities hypertension, hyperlipidemia  AR:   No nasal allergies which patient manages with over-the-counter second-generation antihistamines currently taking Claritin but has tried others in the past and with Flonase she also occasionally has itching and hives which she uses hydroxyzine for, I did see her in the past for this she has had no recent recurrence of her rash but no worsening   Current Outpatient Medications:  .  amLODipine (NORVASC) 2.5 MG tablet, Take 1 tablet (2.5 mg total) by mouth daily., Disp: 90 tablet, Rfl: 3 .  aspirin EC 81 MG tablet, Take 1 tablet (81 mg total) by mouth daily., Disp: , Rfl:  .  BIOTIN PO, Take 1 tablet by mouth  daily., Disp: , Rfl:  .  Cholecalciferol (D3-1000) 1000 units tablet, Take 1,000 Units by mouth daily., Disp: , Rfl:  .  EQ LORATADINE 10 MG tablet, TAKE 1 TABLET BY MOUTH ONCE DAILY AS NEEDED FOR ALLERGIES, Disp: 90 tablet, Rfl: 0 .  fluticasone (FLONASE) 50 MCG/ACT nasal spray, Place 2 sprays into both nostrils daily., Disp: 48 g, Rfl: 3 .  hydrOXYzine (ATARAX/VISTARIL) 10 MG tablet, Take 1-3 tablets (10-30 mg total) by mouth 3 (three) times daily as needed for itching (HIVES)., Disp: 30 tablet, Rfl: 1 .  Multiple Vitamin (MULTI-VITAMINS) TABS, Take by mouth., Disp: , Rfl:  .  triamterene-hydrochlorothiazide (MAXZIDE-25) 37.5-25 MG tablet, Take 1 tablet by mouth daily., Disp: 90  tablet, Rfl: 3  Patient Active Problem List   Diagnosis Date Noted  . 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 Obesity 06/07/2016  . Allergic conjunctivitis and rhinitis 10/19/2015  . HLD (hyperlipidemia) 05/29/2015  . Insomnia 05/29/2015  . Essential hypertension, benign 05/25/2007  . 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 ABDOMINAL HYSTERECTOMY    . BREAST EXCISIONAL BIOPSY Left    benign  . TONSILLECTOMY AND ADENOIDECTOMY      Family History  Problem Relation Age of Onset  . Hypertension Mother   . Cancer Mother        lpymphoma  . Stroke Maternal Grandmother   . Stroke Maternal Grandfather   . Breast cancer Cousin        maternal    Social History   Tobacco Use  . Smoking status: Never Smoker  . Smokeless tobacco: Never Used  Vaping Use  . Vaping Use: Never used  Substance Use Topics  . Alcohol use: No    Alcohol/week: 0.0 standard drinks  . Drug use: No     Allergies  Allergen Reactions  . Celecoxib   . Gabapentin     abnormal dreams    Health Maintenance  Topic Date Due  . TETANUS/TDAP  07/24/2021 (Originally 02/03/1975)  . MAMMOGRAM  04/01/2021  . COLONOSCOPY  06/07/2021  . INFLUENZA VACCINE  Completed  . COVID-19 Vaccine  Completed  . Hepatitis C Screening  Completed  . HIV Screening  Completed  . PAP SMEAR-Modifier  Discontinued    Chart Review Today: I personally reviewed active problem list, medication list, allergies, family history, social history, health maintenance, notes from last encounter, lab results, imaging with the patient/caregiver today.   Review of Systems  10 Systems reviewed and are negative for acute change except as noted in the HPI.  Objective:   Vitals:   07/24/20 0915  BP: 118/80  Pulse: 97  Resp: 16  Temp: 98.3 F (36.8 C)  TempSrc: Oral  SpO2: 100%  Weight: 198 lb 9.6 oz (90.1 kg)  Height: 5\' 2"  (1.575 m)    Body  mass index is 36.32 kg/m.  Physical Exam Vitals and nursing note reviewed.  Constitutional:      General: She is not in acute distress.    Appearance: Normal appearance. She is well-developed. She is obese. She is not ill-appearing, toxic-appearing or diaphoretic.     Interventions: Face mask in place.  HENT:     Head: Normocephalic and atraumatic.     Right Ear: External ear normal.     Left Ear: External ear normal.  Eyes:     General: Lids are normal. No scleral icterus.       Right eye:  No discharge.        Left eye: No discharge.     Conjunctiva/sclera: Conjunctivae normal.  Neck:     Trachea: Phonation normal. No tracheal deviation.  Cardiovascular:     Rate and Rhythm: Normal rate and regular rhythm.     Pulses: Normal pulses.          Radial pulses are 2+ on the right side and 2+ on the left side.       Posterior tibial pulses are 2+ on the right side and 2+ on the left side.     Heart sounds: Normal heart sounds. No murmur heard.  No friction rub. No gallop.   Pulmonary:     Effort: Pulmonary effort is normal. No respiratory distress.     Breath sounds: Normal breath sounds. No stridor. No wheezing, rhonchi or rales.  Chest:     Chest wall: No tenderness.  Abdominal:     General: Bowel sounds are normal. There is no distension.     Palpations: Abdomen is soft.  Skin:    General: Skin is warm and dry.     Capillary Refill: Capillary refill takes less than 2 seconds.     Coloration: Skin is not jaundiced or pale.     Findings: No rash.     Comments: Some hyperpigmentation bilaterally lower extremities, no rash, ulcers or wounds Mild bilateral lower extremity edema that is nonpitting  Neurological:     Mental Status: She is alert. Mental status is at baseline.     Motor: No abnormal muscle tone.     Coordination: Coordination normal.     Gait: Gait normal.  Psychiatric:        Mood and Affect: Mood normal.        Speech: Speech normal.        Behavior: Behavior  normal.         Assessment & Plan:   1. Essential hypertension, benign Stable, well-controlled, blood pressure at goal today, she is tolerating without any side effects or concerns.  We will recheck her electrolytes and kidney function today but they were normal when checked about 5 months ago. Patient concerned with med refills but we did confirm that she has several refills available at her pharmacy. She can continue taking triamterene-hydrochlorothiazide 37.5-25 mg 1 tablet daily And Norvasc 2.5 mg daily - COMPLETE METABOLIC PANEL WITH GFR  2. Insomnia, unspecified type Encourage sleep hygiene, increasing exercise, can try using hydroxyzine 1 to 2 tablets at bedtime follow-up as needed  3. Need for immunization against influenza - Flu Vaccine QUAD 36+ mos IM  4. Hyperlipidemia, unspecified hyperlipidemia type Lengthy discussion today on patient's past labs, current guidelines for management of hyperlipidemia, reviewed her ASCVD risk calculation and reviewed statin medications benefits, mechanism of action, possible side effects and urged her to consider starting a statin medication with her LDL being near 200 in the past.  Suspect that she has familial hyperlipidemia, she denies any first-degree family members with early heart attack or stroke Is not currently having any symptoms of ACS or PAD Info given to pt in handouts and AVS today  Reviewed and encouraged diet lifestyle efforts first-line and adding statin medication to that if LDL is still elevated over 160 or ASCVD risk circulation is still over 7.5% - COMPLETE METABOLIC PANEL WITH GFR - Lipid panel  5. Encounter for medication monitoring - COMPLETE METABOLIC PANEL WITH GFR - Lipid panel - CBC with Differential/Platelet  6. Class 2 severe  obesity with serious comorbidity and body mass index (BMI) of 36.0 to 36.9 in adult, unspecified obesity type (HCC) With associated hyperlipidemia and hypertension, encouraged diet  exercise portion and calorie reduction and hopefully some weight loss  7. Rash and nonspecific skin eruption She has improved rash and itching no current symptoms has not used hydroxyzine in a while  8. 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 RBC microcytosis Reviewed past lab trends and hematology work-up and office visit notes.  Patient has not had any bleeding or clotting concerns she has never had any symptoms or required any interventions, explained to her that we will continue to monitor periodically her blood counts and cell indices  10. Varicose veins of both lower extremities without ulcer or inflammation Patient has signs and symptoms of venous insufficiency some chronic venous stasis hyperpigmentation of the skin and mild swelling but nothing that is causing any pain, rashes wounds or sores encouraged her to avoid salt, keep active, avoid immobility, elevate legs and we discussed use of compression stockings if needed     Return in about 4 months (around 11/23/2020) for HLD .   Danelle BerryLeisa Krislyn Donnan, PA-C 07/24/20 9:36 AM

## 2020-07-24 ENCOUNTER — Other Ambulatory Visit: Payer: Self-pay

## 2020-07-24 ENCOUNTER — Encounter: Payer: Self-pay | Admitting: Family Medicine

## 2020-07-24 ENCOUNTER — Ambulatory Visit (INDEPENDENT_AMBULATORY_CARE_PROVIDER_SITE_OTHER): Payer: 59 | Admitting: Family Medicine

## 2020-07-24 VITALS — BP 118/80 | HR 97 | Temp 98.3°F | Resp 16 | Ht 62.0 in | Wt 198.6 lb

## 2020-07-24 DIAGNOSIS — Z23 Encounter for immunization: Secondary | ICD-10-CM | POA: Diagnosis not present

## 2020-07-24 DIAGNOSIS — H1013 Acute atopic conjunctivitis, bilateral: Secondary | ICD-10-CM

## 2020-07-24 DIAGNOSIS — I10 Essential (primary) hypertension: Secondary | ICD-10-CM

## 2020-07-24 DIAGNOSIS — Z5181 Encounter for therapeutic drug level monitoring: Secondary | ICD-10-CM

## 2020-07-24 DIAGNOSIS — E785 Hyperlipidemia, unspecified: Secondary | ICD-10-CM

## 2020-07-24 DIAGNOSIS — J309 Allergic rhinitis, unspecified: Secondary | ICD-10-CM

## 2020-07-24 DIAGNOSIS — R21 Rash and other nonspecific skin eruption: Secondary | ICD-10-CM

## 2020-07-24 DIAGNOSIS — G4700 Insomnia, unspecified: Secondary | ICD-10-CM

## 2020-07-24 DIAGNOSIS — Z6836 Body mass index (BMI) 36.0-36.9, adult: Secondary | ICD-10-CM

## 2020-07-24 DIAGNOSIS — R718 Other abnormality of red blood cells: Secondary | ICD-10-CM

## 2020-07-24 DIAGNOSIS — I8393 Asymptomatic varicose veins of bilateral lower extremities: Secondary | ICD-10-CM

## 2020-07-24 NOTE — Patient Instructions (Addendum)
Blood pressure looks great today- you should have plenty of refills at [facility] to last until next April    Recommendations on cholesterol and starting statins.  There is a benefit from LDL-C (bad cholesterol) lowering with statin therapy at virtually all levels of cardiovascular risk.  If statin therapy had no side effects and caused no financial burden, it might be reasonable to recommend it to virtually all at-risk individuals, similar to a healthy diet and exercise  It is this good of a medication!!  It reduces risk in almost everyone.   There are possible side effects with ALL medications and with statins there is a small subset of the population who may not metabolize it well, which causing muscle aches as a side effect (~5%).  We monitor for this, can test for this, and usually are careful to work with you to get a medication that gives you the benefits with minimal side effects.  Some people are sensitive to medications in general and we try to use the highest dose tolerated to give the most benefit.   American Heart Association/American College of Cardiology cholesterol and statin guidelines are as follows: In adults 1 to 63 years of age without diabetes mellitus and with LDL-C levels ?70, at a 10-year atherosclerotic cardiovascular disease risk of ?7.5 percent, start a moderate-intensity statin if a discussion of treatment options favors statin therapy  If LDL is >160, statins are indicated.  Patients with other significant risk factors would also benefit from statin.  Some of these factors include a family history of premature cardiovascular disease, chronic kidney disease, or chronic inflammatory disorder (such as chronic human immunodeficiency viral infection).   Can get more information at the following website:  [employer]Aff[employer]ateProgram.fi       Dyslipidemia Dyslipidemia is an imbalance of waxy, fat-like substances  (lipids) in the blood. The body needs lipids in small amounts. Dyslipidemia often involves a high level of cholesterol or triglycerides, which are types of lipids. Common forms of dyslipidemia include:  High levels of LDL cholesterol. LDL is the type of cholesterol that causes fatty deposits (plaques) to build up in the blood vessels that carry blood away from your heart (arteries).  Low levels of HDL cholesterol. HDL cholesterol is the type of cholesterol that protects against heart disease. High levels of HDL remove the LDL buildup from arteries.  High levels of triglycerides. Triglycerides are a fatty substance in the blood that is linked to a buildup of plaques in the arteries. What are the causes? Primary dyslipidemia is caused by changes (mutations) in genes that are passed down through families (inherited). These mutations cause several types of dyslipidemia. Secondary dyslipidemia is caused by lifestyle choices and diseases that lead to dyslipidemia, such as:  Eating a diet that is high in animal fat.  Not getting enough exercise.  Having diabetes, kidney disease, liver disease, or thyroid disease.  Drinking large amounts of alcohol.  Using certain medicines. What increases the risk? You are more likely to develop this condition if you are an older man or if you are a woman who has gone through menopause. Other risk factors include:  Having a family history of dyslipidemia.  Taking certain medicines, including birth control pills, steroids, some diuretics, and beta-blockers.  Smoking cigarettes.  Eating a high-fat diet.  Having certain medical conditions such as diabetes, polycystic ovary syndrome (PCOS), kidney disease, liver disease, or hypothyroidism.  Not exercising regularly.  Being overweight or obese with too much belly fat. What are the  signs or symptoms? In most cases, dyslipidemia does not usually cause any symptoms. In severe cases, very high lipid levels can  cause:  Fatty bumps under the skin (xanthomas).  White or gray ring around the black center (pupil) of the eye. Very high triglyceride levels can cause inflammation of the pancreas (pancreatitis). How is this diagnosed? Your health care provider may diagnose dyslipidemia based on a routine blood test (fasting blood test). Because most people do not have symptoms of the condition, this blood testing (lipid profile) is done on adults age 60 and older and is repeated every 5 years. This test checks:  Total cholesterol. This measures the total amount of cholesterol in your blood, including LDL cholesterol, HDL cholesterol, and triglycerides. A healthy number is below 200.  LDL cholesterol. The target number for LDL cholesterol is different for each person, depending on individual risk factors. Ask your health care provider what your LDL cholesterol should be.  HDL cholesterol. An HDL level of 60 or higher is best because it helps to protect against heart disease. A number below 40 for men or below 50 for women increases the risk for heart disease.  Triglycerides. A healthy triglyceride number is below 150. If your lipid profile is abnormal, your health care provider may do other blood tests. How is this treated? Treatment depends on the type of dyslipidemia that you have and your other risk factors for heart disease and stroke. Your health care provider will have a target range for your lipid levels based on this information. For many people, this condition may be treated by lifestyle changes, such as diet and exercise. Your health care provider may recommend that you:  Get regular exercise.  Make changes to your diet.  Quit smoking if you smoke. If diet changes and exercise do not help you reach your goals, your health care provider may also prescribe medicine to lower lipids. The most commonly prescribed type of medicine lowers your LDL cholesterol (statin drug). If you have a high  triglyceride level, your provider may prescribe another type of drug (fibrate) or an omega-3 fish oil supplement, or both. Follow these instructions at home:  Eating and drinking  Follow instructions from your health care provider or dietitian about eating or drinking restrictions.  Eat a healthy diet as told by your health care provider. This can help you reach and maintain a healthy weight, lower your LDL cholesterol, and raise your HDL cholesterol. This may include: ? Limiting your calories, if you are overweight. ? Eating more fruits, vegetables, whole grains, fish, and lean meats. ? Limiting saturated fat, trans fat, and cholesterol.  If you drink alcohol: ? Limit how much you use. ? Be aware of how much alcohol is in your drink. In the [place] one drink equals one 12 oz bottle of beer (355 mL), one 5 oz glass of wine (148 mL), or one 1 oz glass of hard liquor (44 mL).  Do not drink alcohol if: ? Your health care provider tells you not to drink. ? You are pregnant, may be pregnant, or are planning to become pregnant. Activity  Get regular exercise. Start an exercise and strength training program as told by your health care provider. Ask your health care provider what activities are safe for you. Your health care provider may recommend: ? 30 minutes of aerobic activity 4-6 days a week. Brisk walking is an example of aerobic activity. ? Strength training 2 days a week. General instructions  Do not use  any products that contain nicotine or tobacco, such as cigarettes, e-cigarettes, and chewing tobacco. If you need help quitting, ask your health care provider.  Take over-the-counter and prescription medicines only as told by your health care provider. This includes supplements.  Keep all follow-up visits as told by your health care provider. Contact a health care provider if:  You are: ? Having trouble sticking to your exercise or diet plan. ? Struggling to quit smoking or control  your use of alcohol. Summary  Dyslipidemia often involves a high level of cholesterol or triglycerides, which are types of lipids.  Treatment depends on the type of dyslipidemia that you have and your other risk factors for heart disease and stroke.  For many people, treatment starts with lifestyle changes, such as diet and exercise.  Your health care provider may prescribe medicine to lower lipids. This information is not intended to replace advice given to you by your health care provider. Make sure you discuss any questions you have with your health care provider. Document Revised: 06/18/2018 Document Reviewed: 05/25/2018 Elsevier Patient Education  2020 [employer].

## 2020-07-25 LAB — COMPLETE METABOLIC PANEL WITH GFR
AG Ratio: 1.6 (calc) (ref 1.0–2.5)
ALT: 12 U/L (ref 6–29)
AST: 13 U/L (ref 10–35)
Albumin: 4.4 g/dL (ref 3.6–5.1)
Alkaline phosphatase (APISO): 69 U/L (ref 37–153)
BUN/Creatinine Ratio: 13 (calc) (ref 6–22)
BUN: 13 mg/dL (ref 7–25)
CO2: 28 mmol/L (ref 20–32)
Calcium: 9.8 mg/dL (ref 8.6–10.4)
Chloride: 103 mmol/L (ref 98–110)
Creat: 1 mg/dL — ABNORMAL HIGH (ref 0.50–0.99)
GFR, Est African American: 69 mL/min/{1.73_m2} (ref >=60)
GFR, Est Non African American: 60 mL/min/{1.73_m2} (ref >=60)
Globulin: 2.8 g/dL (calc) (ref 1.9–3.7)
Glucose, Bld: 84 mg/dL (ref 65–99)
Potassium: 4.6 mmol/L (ref 3.5–5.3)
Sodium: 142 mmol/L (ref 135–146)
Total Bilirubin: 0.3 mg/dL (ref 0.2–1.2)
Total Protein: 7.2 g/dL (ref 6.1–8.1)

## 2020-07-25 LAB — CBC WITH DIFFERENTIAL/PLATELET
Absolute Monocytes: 500 cells/uL (ref 200–950)
Basophils Absolute: 80 cells/uL (ref 0–200)
Basophils Relative: 1.6 %
Eosinophils Absolute: 60 cells/uL (ref 15–500)
Eosinophils Relative: 1.2 %
HCT: 46.5 % — ABNORMAL HIGH (ref 35.0–45.0)
Hemoglobin: 14 g/dL (ref 11.7–15.5)
Lymphs Abs: 2040 cells/uL (ref 850–3900)
MCH: 21 pg — ABNORMAL LOW (ref 27.0–33.0)
MCHC: 30.1 g/dL — ABNORMAL LOW (ref 32.0–36.0)
MCV: 69.8 fL — ABNORMAL LOW (ref 80.0–100.0)
MPV: 11.1 fL (ref 7.5–12.5)
Monocytes Relative: 10 %
Neutro Abs: 2320 cells/uL (ref 1500–7800)
Neutrophils Relative %: 46.4 %
Platelets: 300 10*3/uL (ref 140–400)
RBC: 6.66 10*6/uL — ABNORMAL HIGH (ref 3.80–5.10)
RDW: 17.1 % — ABNORMAL HIGH (ref 11.0–15.0)
Total Lymphocyte: 40.8 %
WBC: 5 10*3/uL (ref 3.8–10.8)

## 2020-07-25 LAB — LIPID PANEL
Cholesterol: 275 mg/dL — ABNORMAL HIGH (ref <200)
HDL: 41 mg/dL — ABNORMAL LOW (ref >=50)
LDL Cholesterol (Calc): 193 mg/dL (calc) — ABNORMAL HIGH
Non-HDL Cholesterol (Calc): 234 mg/dL (calc) — ABNORMAL HIGH (ref <130)
Total CHOL/HDL Ratio: 6.7 (calc) — ABNORMAL HIGH (ref <5.0)
Triglycerides: 224 mg/dL — ABNORMAL HIGH (ref <150)

## 2020-07-31 ENCOUNTER — Other Ambulatory Visit: Payer: Self-pay | Admitting: Family Medicine

## 2020-07-31 MED ORDER — ROSUVASTATIN CALCIUM 10 MG PO TABS
10.0000 mg | ORAL_TABLET | Freq: Every day | ORAL | 3 refills | Status: DC
Start: 2020-07-31 — End: 2021-11-03

## 2020-08-06 ENCOUNTER — Encounter: Payer: Self-pay | Admitting: Family Medicine

## 2020-08-31 ENCOUNTER — Telehealth: Payer: Self-pay | Admitting: Family Medicine

## 2020-08-31 NOTE — Telephone Encounter (Signed)
Patient is caling to ask for advice regarding her sinuses Patient dicussed sinuses with Leisa on 07/24/20. Patient increased her sinus medication to 2 pills and 2 sprays per nose. Patient reports still congestion and sneezing. Patient is wanting something to clear her head. Please advise [phone] Preferred Pharmacy-[facility] [address]. [place].

## 2020-09-02 NOTE — Telephone Encounter (Signed)
Called patient. NA.

## 2020-09-07 ENCOUNTER — Ambulatory Visit (INDEPENDENT_AMBULATORY_CARE_PROVIDER_SITE_OTHER): Payer: 59 | Admitting: Family Medicine

## 2020-09-07 ENCOUNTER — Ambulatory Visit: Payer: Self-pay

## 2020-09-07 ENCOUNTER — Encounter: Payer: Self-pay | Admitting: Family Medicine

## 2020-09-07 VITALS — Ht 62.0 in | Wt 198.0 lb

## 2020-09-07 DIAGNOSIS — R42 Dizziness and giddiness: Secondary | ICD-10-CM

## 2020-09-07 DIAGNOSIS — J310 Chronic rhinitis: Secondary | ICD-10-CM | POA: Diagnosis not present

## 2020-09-07 DIAGNOSIS — J329 Chronic sinusitis, unspecified: Secondary | ICD-10-CM | POA: Diagnosis not present

## 2020-09-07 MED ORDER — MECLIZINE HCL 25 MG PO TABS: 12.5000 mg | ORAL_TABLET | Freq: Three times a day (TID) | ORAL | 1 refills | Status: DC | PRN

## 2020-09-07 NOTE — Progress Notes (Signed)
Name: Vanessa Gross   MRN: [id number]    DOB: Feb 03, 1956   Date:09/07/2020       Progress Note  Subjective  Chief Complaint  Chief Complaint  Patient presents with  . Dizziness    I connected with  Vanessa Gross  on 09/07/20 at  2:20 PM EDT by a video enabled telemedicine application and verified that I am speaking with the correct person using two identifiers.  I discussed the limitations of evaluation and management by telemedicine and the availability of in person appointments. The patient expressed understanding and agreed to proceed with the virtual visit  Staff also discussed with the patient that there may be a patient responsible charge related to this service. Patient Location: home Provider Location: [facility] clinic Additional Individuals present: none   HPI Pt presents with dizziness that occurred she bent over and picked something up and stood up and she became very dizzy suddenly, lasts a few seconds.  She has another episode when she got out of bed and lifted her head off the pillow She has been having more and more episodes with head movement, being very careful and unable to do anything She has seasonal allergies and notes she has increased her OTC allergy meds and has been cleaning her ears more.   No pain, fever, numbness, weakness, syncope, CP, SOB, vomiting.      Patient Active Problem List   Diagnosis Date Noted  . 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 Obesity 06/07/2016  . Allergic conjunctivitis and rhinitis 10/19/2015  . HLD (hyperlipidemia) 05/29/2015  . Insomnia 05/29/2015  . Essential hypertension, benign 05/25/2007  . 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 ABDOMINAL HYSTERECTOMY    . BREAST EXCISIONAL BIOPSY Left    benign  . TONSILLECTOMY AND ADENOIDECTOMY      Family History  Problem Relation Age of Onset  . Hypertension Mother   . Cancer Mother         lpymphoma  . Stroke Maternal Grandmother   . Stroke Maternal Grandfather   . Breast cancer Cousin        maternal    Social History   Socioeconomic History  . Marital status: Married    Spouse name: Gerri Spore   . Number of children: 3  . Years of education: 10  . Highest education level: GED or equivalent  Occupational History  . Not on file  Tobacco Use  . Smoking status: Never Smoker  . Smokeless tobacco: Never Used  Vaping Use  . Vaping Use: Never used  Substance and Sexual Activity  . Alcohol use: No    Alcohol/week: 0.0 standard drinks  . Drug use: No  . Sexual activity: Not Currently  Other Topics Concern  . 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 Difficulty of Paying Living Expenses: Not on file  Food Insecurity:   . Worried About Programme researcher, broadcasting/film/video in the Last Year: Not on file  . Ran Out of Food in the Last Year: Not on file  Transportation Needs:   . Lack of Transportation (Medical): Not on file  . Lack of Transportation (Non-Medical): Not on file  Physical Activity:   . Days of Exercise per Week: Not on file  . Minutes of Exercise per  Session: Not on file  Stress:   . Feeling of Stress : Not on file  Social Connections:   . Frequency of Communication with Friends and Family: Not on file  . Frequency of Social Gatherings with Friends and Family: Not on file  . Attends Religious Services: Not on file  . Active Member of Clubs or Organizations: Not on file  . Attends Banker Meetings: Not on file  . Marital Status: Not on file  Intimate Partner Violence:   . Fear of Current or Ex-Partner: Not on file  . Emotionally Abused: Not on file  . Physically Abused: Not on file  . Sexually Abused: Not on file     Current Outpatient Medications:  .  amLODipine (NORVASC) 2.5 MG tablet, Take 1 tablet (2.5 mg total) by mouth daily., Disp:  90 tablet, Rfl: 3 .  aspirin EC 81 MG tablet, Take 1 tablet (81 mg total) by mouth daily., Disp: , Rfl:  .  Cholecalciferol (D3-1000) 1000 units tablet, Take 1,000 Units by mouth daily., Disp: , Rfl:  .  EQ LORATADINE 10 MG tablet, TAKE 1 TABLET BY MOUTH ONCE DAILY AS NEEDED FOR ALLERGIES, Disp: 90 tablet, Rfl: 0 .  fluticasone (FLONASE) 50 MCG/ACT nasal spray, Place 2 sprays into both nostrils daily., Disp: 48 g, Rfl: 3 .  Multiple Vitamin (MULTI-VITAMINS) TABS, Take by mouth., Disp: , Rfl:  .  triamterene-hydrochlorothiazide (MAXZIDE-25) 37.5-25 MG tablet, Take 1 tablet by mouth daily., Disp: 90 tablet, Rfl: 3 .  BIOTIN PO, Take 1 tablet by mouth daily. (Patient not taking: Reported on 09/07/2020), Disp: , Rfl:  .  hydrOXYzine (ATARAX/VISTARIL) 10 MG tablet, Take 1-3 tablets (10-30 mg total) by mouth 3 (three) times daily as needed for itching (HIVES). (Patient not taking: Reported on 09/07/2020), Disp: 30 tablet, Rfl: 1 .  rosuvastatin (CRESTOR) 10 MG tablet, Take 1 tablet (10 mg total) by mouth at bedtime. (Patient not taking: Reported on 09/07/2020), Disp: 90 tablet, Rfl: 3  Allergies  Allergen Reactions  . Celecoxib   . Gabapentin     abnormal dreams   I personally reviewed active problem list, medication list, allergies, family history, social history, health maintenance, notes from last encounter, lab results, imaging with the patient/caregiver today.   ROS 10 Systems reviewed and are negative for acute change except as noted in the HPI.   Objective Vitals:   09/07/20 1336  Weight: 198 lb (89.8 kg)  Height: 5\' 2"  (1.575 m)   Virtual encounter, vitals not obtained.  Body mass index is 36.21 kg/m.  Physical Exam Vitals and nursing note reviewed.  Constitutional:      General: She is not in acute distress.    Appearance: Normal appearance. She is obese. She is not ill-appearing, toxic-appearing or diaphoretic.  HENT:     Head: Normocephalic and atraumatic.  Eyes:      General:        Right eye: No discharge.        Left eye: No discharge.     Conjunctiva/sclera: Conjunctivae normal.  Neurological:     Mental Status: She is alert.     Cranial Nerves: No dysarthria or facial asymmetry.     Comments: Normal coordination of bilateral UE  Psychiatric:        Mood and Affect: Mood normal.        Behavior: Behavior normal.      No results found for this or any previous visit (from the past 72 hour(s)).  Assessment & Plan  1. Vertigo History sounds most suspicious for BPPV Pt prefers to come in person for further eval and physical exam I would like to check her ENT exam and do neuro  Recent AR hx and notes of worsening ear sx and cleaning ears out - would like to make sure no effusion/cerumen impaction etc. From video call today - pt alert, talkative, well appearing, speech clear, no facial droop - doubt any central vertigo causes Trial of meclizine Sent pt info on Epley maneuver  When I explained she may need specialists to help - she said no, that she couldn't due to her insurance - I explained that our offices and exam tables will not allow up to do epley's maneuver and she may need further specialist eval if her sx do not improve. - meclizine (ANTIVERT) 25 MG tablet; Take 0.5-1 tablets (12.5-25 mg total) by mouth 3 (three) times daily as needed for dizziness.  Dispense: 30 tablet; Refill: 1  2. Rhinosinusitis Worsening sx over the past 1-2 months  Pt decided to come in Wed at 11 am for in person appt and exam   I discussed the assessment and treatment plan with the patient. The patient was provided an opportunity to ask questions and all were answered. The patient agreed with the plan and demonstrated an understanding of the instructions.  The patient was advised to call back or seek an in-person evaluation if the symptoms worsen or if the condition fails to improve as anticipated.  I provided 20+ minutes of non-face-to-face time during this  encounter.

## 2020-09-07 NOTE — Patient Instructions (Addendum)

## 2020-09-07 NOTE — Telephone Encounter (Signed)
Pt c/o 2 weeks of intermittent vertigo. Pt stated that it feels like the room is spinning. Pt has been having sinus congestion. Denies numbness or tingling to face, either arms or legs.  Pt curt with NT. She stated she called in last week and no one called her back. Per TE 08/31/20 she was called but no answer.  Pt given care advice and appt scheduled for Wednesday. Pt verbalized understanding.       Reason for Disposition . [1] MILD dizziness (e.g., vertigo; walking normally) AND [2] has NOT been evaluated by physician for this  Answer Assessment - Initial Assessment Questions 1. DESCRIPTION: "Describe your dizziness."     Room is spinning 2. VERTIGO: "Do you feel like either you or the room is spinning or tilting?"      yes 3. LIGHTHEADED: "Do you feel lightheaded?" (e.g., somewhat faint, woozy, weak upon standing)     no 4. SEVERITY: "How bad is it?"  "Can you walk?"   - MILD: Feels unsteady but walking normally.   - MODERATE: Feels very unsteady when walking, but not falling; interferes with normal activities (e.g., school, work) .   - SEVERE: Unable to walk without falling, or requires assistance to walk without falling.     mild 5. ONSET:  "When did the dizziness begin?"     2 weeks 6. AGGRAVATING FACTORS: "Does anything make it worse?" (e.g., standing, change in head position)    Lifting up head in bed 7. CAUSE: "What do you think is causing the dizziness?"     sinus 8. RECURRENT SYMPTOM: "Have you had dizziness before?" If Yes, ask: "When was the last time?" "What happened that time?"    Yes- a long time ago 9. OTHER SYMPTOMS: "Do you have any other symptoms?" (e.g., headache, weakness, numbness, vomiting, earache)     no 10. PREGNANCY: "Is there any chance you are pregnant?" "When was your last menstrual period?"       n/a  Protocols used: DIZZINESS - VERTIGO-A-AH

## 2020-09-07 NOTE — Telephone Encounter (Signed)
Patient notified. Appointment 2:20 virtual today

## 2020-09-08 ENCOUNTER — Telehealth: Payer: 59 | Admitting: Family Medicine

## 2020-09-09 ENCOUNTER — Ambulatory Visit: Payer: Self-pay | Admitting: Family Medicine

## 2020-09-09 ENCOUNTER — Encounter: Payer: Self-pay | Admitting: Family Medicine

## 2020-09-09 ENCOUNTER — Ambulatory Visit (INDEPENDENT_AMBULATORY_CARE_PROVIDER_SITE_OTHER): Payer: 59 | Admitting: Family Medicine

## 2020-09-09 ENCOUNTER — Other Ambulatory Visit: Payer: Self-pay

## 2020-09-09 VITALS — BP 124/88 | HR 90 | Temp 98.4°F | Resp 18 | Ht 62.0 in | Wt 198.7 lb

## 2020-09-09 DIAGNOSIS — J310 Chronic rhinitis: Secondary | ICD-10-CM

## 2020-09-09 DIAGNOSIS — J329 Chronic sinusitis, unspecified: Secondary | ICD-10-CM | POA: Diagnosis not present

## 2020-09-09 DIAGNOSIS — R42 Dizziness and giddiness: Secondary | ICD-10-CM | POA: Diagnosis not present

## 2020-09-09 MED ORDER — DOXYCYCLINE HYCLATE 100 MG PO TABS: 100.0000 mg | ORAL_TABLET | Freq: Two times a day (BID) | ORAL | 0 refills | Status: AC

## 2020-09-09 MED ORDER — LORATADINE 10 MG PO TABS: 10.0000 mg | ORAL_TABLET | Freq: Every day | ORAL | 11 refills | Status: DC

## 2020-09-09 MED ORDER — AZELASTINE HCL 0.1 % NA SOLN: 2.0000 | Freq: Two times a day (BID) | NASAL | 12 refills | Status: DC

## 2020-09-09 NOTE — Progress Notes (Signed)
Patient ID: Vanessa Gross, female    DOB: 1956/10/21, 64 y.o.   MRN: [id number]  PCP: Danelle Berry, PA-C  Chief Complaint  Patient presents with  . Dizziness    Subjective:   Vanessa Gross is a 64 y.o. female, presents to clinic with CC of the following:  HPI  HPI from 09/07/2020 virtual visit: HPI Pt presents with dizziness that occurred she bent over and picked something up and stood up and she became very dizzy suddenly, lasts a few seconds.  She has another episode when she got out of bed and lifted her head off the pillow She has been having more and more episodes with head movement, being very careful and unable to do anything She has seasonal allergies and notes she has increased her OTC allergy meds and has been cleaning her ears more.   No pain, fever, numbness, weakness, syncope, CP, SOB, vomiting.  Pt has continued to be careful with head movements.  She took meclizine, it did help, episodes less frequent and less severe.  She feels she needs abx for sinuses. She is almost out of her Claritin, she stopped taking Flonase because it did not seem to work anymore she continues to have intermittent and alternating ear congestion  Patient Active Problem List   Diagnosis Date Noted  . 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 Obesity 06/07/2016  . Allergic conjunctivitis and rhinitis 10/19/2015  . HLD (hyperlipidemia) 05/29/2015  . Insomnia 05/29/2015  . Essential hypertension, benign 05/25/2007  . 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  amLODipine (NORVASC) 2.5 MG tablet, Take 1 tablet (2.5 mg total) by mouth daily., Disp: 90 tablet, Rfl: 3 .  aspirin EC 81 MG tablet, Take 1 tablet (81 mg total) by mouth daily., Disp: , Rfl:  .  BIOTIN PO, Take 1 tablet by mouth daily. , Disp: , Rfl:  .  Cholecalciferol (D3-1000) 1000 units tablet, Take 1,000 Units by mouth daily., Disp: , Rfl:  .  hydrOXYzine  (ATARAX/VISTARIL) 10 MG tablet, Take 1-3 tablets (10-30 mg total) by mouth 3 (three) times daily as needed for itching (HIVES)., Disp: 30 tablet, Rfl: 1 .  meclizine (ANTIVERT) 25 MG tablet, Take 0.5-1 tablets (12.5-25 mg total) by mouth 3 (three) times daily as needed for dizziness., Disp: 30 tablet, Rfl: 1 .  Multiple Vitamin (MULTI-VITAMINS) TABS, Take by mouth., Disp: , Rfl:  .  rosuvastatin (CRESTOR) 10 MG tablet, Take 1 tablet (10 mg total) by mouth at bedtime., Disp: 90 tablet, Rfl: 3 .  triamterene-hydrochlorothiazide (MAXZIDE-25) 37.5-25 MG tablet, Take 1 tablet by mouth daily., Disp: 90 tablet, Rfl: 3 .  azelastine (ASTELIN) 0.1 % nasal spray, Place 2 sprays into both nostrils 2 (two) times daily. Use in each nostril as directed, Disp: 30 mL, Rfl: 12 .  doxycycline (VIBRA-TABS) 100 MG tablet, Take 1 tablet (100 mg total) by mouth 2 (two) times daily for 7 days., Disp: 14 tablet, Rfl: 0 .  loratadine (CLARITIN) 10 MG tablet, Take 1 tablet (10 mg total) by mouth daily., Disp: 30 tablet, Rfl: 11   Allergies  Allergen Reactions  . Celecoxib   . Gabapentin     abnormal dreams     Social History   Tobacco Use  . Smoking status: Never Smoker  . Smokeless tobacco: Never Used  Vaping Use  . Vaping Use: Never used  Substance Use Topics  . Alcohol use: No  Alcohol/week: 0.0 standard drinks  . Drug use: No      Chart Review Today: I personally reviewed active problem list, medication list, allergies, family history, social history, health maintenance, notes from last encounter, lab results, imaging with the patient/caregiver today.   Review of Systems 10 Systems reviewed and are negative for acute change except as noted in the HPI.     Objective:   Vitals:   09/09/20 1106  BP: 124/88  Pulse: 90  Resp: 18  Temp: 98.4 F (36.9 C)  TempSrc: Oral  SpO2: 98%  Weight: 198 lb 11.2 oz (90.1 kg)  Height: 5\' 2"  (1.575 m)    Body mass index is 36.34 kg/m.  Physical  Exam Vitals and nursing note reviewed.  Constitutional:      General: She is not in acute distress.    Appearance: Normal appearance. She is well-developed. She is not ill-appearing, toxic-appearing or diaphoretic.     Interventions: Face mask in place.  HENT:     Head: Normocephalic and atraumatic.     Right Ear: Hearing, tympanic membrane, ear canal and external ear normal.     Left Ear: Hearing, tympanic membrane, ear canal and external ear normal.     Nose: Mucosal edema, congestion and rhinorrhea present.     Right Turbinates: Enlarged and swollen.     Left Turbinates: Enlarged and swollen.     Mouth/Throat:     Mouth: Mucous membranes are not pale.     Pharynx: Uvula midline. Posterior oropharyngeal erythema (mild) present. No oropharyngeal exudate or uvula swelling.     Tonsils: No tonsillar abscesses.  Eyes:     General: Lids are normal. No scleral icterus.       Right eye: No discharge.        Left eye: No discharge.     Conjunctiva/sclera: Conjunctivae normal.     Pupils: Pupils are equal, round, and reactive to light.  Neck:     Trachea: Phonation normal. No tracheal deviation.  Cardiovascular:     Rate and Rhythm: Normal rate and regular rhythm.     Pulses: Normal pulses.          Radial pulses are 2+ on the right side and 2+ on the left side.       Posterior tibial pulses are 2+ on the right side and 2+ on the left side.     Heart sounds: Normal heart sounds. No murmur heard.  No friction rub. No gallop.   Pulmonary:     Effort: Pulmonary effort is normal. No respiratory distress.     Breath sounds: Normal breath sounds. No stridor. No wheezing, rhonchi or rales.  Chest:     Chest wall: No tenderness.  Abdominal:     General: Bowel sounds are normal. There is no distension.     Palpations: Abdomen is soft.  Musculoskeletal:        General: Normal range of motion.     Cervical back: Normal range of motion and neck supple.     Right lower leg: No edema.     Left  lower leg: No edema.  Skin:    General: Skin is warm and dry.     Coloration: Skin is not jaundiced or pale.     Findings: No rash.  Neurological:     Mental Status: She is alert.     Motor: No abnormal muscle tone.     Coordination: Coordination normal.     Gait: Gait normal.  Comments: LANG/SPEECH: Naming and repetition intact, fluent, no dysarthria, follows 3-step commands, answers questions appropriately   CRANIAL NERVES:   II: Pupils equal and reactive, no RAPD   III, IV, VI: EOM intact, no gaze preference or deviation, no nystagmus.   V: normal sensation in V1, V2, and V3 segments bilaterally   VII: no asymmetry, no nasolabial fold flattening   VIII: normal hearing to speech   IX, X: normal palatal elevation, no uvular deviation   XI: 5/5 head turn and 5/5 shoulder shrug bilaterally   XII: midline tongue protrusion  MOTOR:  5/5 bilateral grip strength 5/5 strength dorsiflexion/plantarflexion b/l   SENSORY:  Normal to light touch Romberg absent  COORD: Normal finger to nose and heel to shin, no tremor, no dysmetria  STATION: normal stance, no truncal ataxia  GAIT: Normal; normal heel-walk.   Psychiatric:        Mood and Affect: Mood normal.        Speech: Speech normal.        Behavior: Behavior normal.      Results for orders placed or performed in visit on 07/24/20  COMPLETE METABOLIC PANEL WITH GFR  Result Value Ref Range   Glucose, Bld 84 65 - 99 mg/dL   BUN 13 7 - 25 mg/dL   Creat 7.90 (H) 2.40 - 0.99 mg/dL   GFR, Est Non African American 60 > OR = 60 mL/min/1.33m2   GFR, Est African American 69 > OR = 60 mL/min/1.68m2   BUN/Creatinine Ratio 13 6 - 22 (calc)   Sodium 142 135 - 146 mmol/L   Potassium 4.6 3.5 - 5.3 mmol/L   Chloride 103 98 - 110 mmol/L   CO2 28 20 - 32 mmol/L   Calcium 9.8 8.6 - 10.4 mg/dL   Total Protein 7.2 6.1 - 8.1 g/dL   Albumin 4.4 3.6 - 5.1 g/dL   Globulin 2.8 1.9 - 3.7 g/dL (calc)   AG Ratio 1.6 1.0 - 2.5 (calc)    Total Bilirubin 0.3 0.2 - 1.2 mg/dL   Alkaline phosphatase (APISO) 69 37 - 153 U/L   AST 13 10 - 35 U/L   ALT 12 6 - 29 U/L  Lipid panel  Result Value Ref Range   Cholesterol 275 (H) <200 mg/dL   HDL 41 (L) > OR = 50 mg/dL   Triglycerides 973 (H) <150 mg/dL   LDL Cholesterol (Calc) 193 (H) mg/dL (calc)   Total CHOL/HDL Ratio 6.7 (H) <5.0 (calc)   Non-HDL Cholesterol (Calc) 234 (H) <130 mg/dL (calc)  CBC with Differential/Platelet  Result Value Ref Range   WBC 5.0 3.8 - 10.8 Thousand/uL   RBC 6.66 (H) 3.80 - 5.10 Million/uL   Hemoglobin 14.0 11.7 - 15.5 g/dL   HCT 53.2 (H) 35 - 45 %   MCV 69.8 (L) 80.0 - 100.0 fL   MCH 21.0 (L) 27.0 - 33.0 pg   MCHC 30.1 (L) 32.0 - 36.0 g/dL   RDW 99.2 (H) 42.6 - 83.4 %   Platelets 300 140 - 400 Thousand/uL   MPV 11.1 7.5 - 12.5 fL   Neutro Abs 2,320 1,500 - 7,800 cells/uL   Lymphs Abs 2,040 850 - 3,900 cells/uL   Absolute Monocytes 500 200 - 950 cells/uL   Eosinophils Absolute 60 15.0 - 500.0 cells/uL   Basophils Absolute 80 0.0 - 200.0 cells/uL   Neutrophils Relative % 46.4 %   Total Lymphocyte 40.8 %   Monocytes Relative 10.0 %   Eosinophils Relative 1.2 %  Basophils Relative 1.6 %   Smear Review         Assessment & Plan:     ICD-10-CM   1. Vertigo  R42    improving with meclizine prn, taking 12.5 mg dose, no neuro deficit on exam - peripheral vs BPPV   2. Rhinosinusitis  J31.0 doxycycline (VIBRA-TABS) 100 MG tablet   J32.9 loratadine (CLARITIN) 10 MG tablet    azelastine (ASTELIN) 0.1 % nasal spray   severe erythema, edema and congestion, may be contributory to ear sx/vertigo, encouraged her to resume allergy meds, tx with abx for prolonged worse sx        Danelle Berry, PA-C 09/09/20 1:41 PM

## 2020-09-09 NOTE — Patient Instructions (Signed)
Take antibiotics with food in stomach, and take with a full glass of water  I refilled claritin  Stop flonase and try the new nose spray  Continue meclizine as needed for dizzy episodes  Follow up if your symptoms are not improving in the next 2 weeks   Sinusitis, Adult Sinusitis is soreness and swelling (inflammation) of your sinuses. Sinuses are hollow spaces in the bones around your face. They are located:  Around your eyes.  In the middle of your forehead.  Behind your nose.  In your cheekbones. Your sinuses and nasal passages are lined with a fluid called mucus. Mucus drains out of your sinuses. Swelling can trap mucus in your sinuses. This lets germs (bacteria, virus, or fungus) grow, which leads to infection. Most of the time, this condition is caused by a virus. What are the causes? This condition is caused by:  Allergies.  Asthma.  Germs.  Things that block your nose or sinuses.  Growths in the nose (nasal polyps).  Chemicals or irritants in the air.  Fungus (rare). What increases the risk? You are more likely to develop this condition if:  You have a weak body defense system (immune system).  You do a lot of swimming or diving.  You use nasal sprays too much.  You smoke. What are the signs or symptoms? The main symptoms of this condition are pain and a feeling of pressure around the sinuses. Other symptoms include:  Stuffy nose (congestion).  Runny nose (drainage).  Swelling and warmth in the sinuses.  Headache.  Toothache.  A cough that may get worse at night.  Mucus that collects in the throat or the back of the nose (postnasal drip).  Being unable to smell and taste.  Being very tired (fatigue).  A fever.  Sore throat.  Bad breath. How is this diagnosed? This condition is diagnosed based on:  Your symptoms.  Your medical history.  A physical exam.  Tests to find out if your condition is short-term (acute) or long-term  (chronic). Your doctor may: ? Check your nose for growths (polyps). ? Check your sinuses using a tool that has a light (endoscope). ? Check for allergies or germs. ? Do imaging tests, such as an MRI or CT scan. How is this treated? Treatment for this condition depends on the cause and whether it is short-term or long-term.  If caused by a virus, your symptoms should go away on their own within 10 days. You may be given medicines to relieve symptoms. They include: ? Medicines that shrink swollen tissue in the nose. ? Medicines that treat allergies (antihistamines). ? A spray that treats swelling of the nostrils. ? Rinses that help get rid of thick mucus in your nose (nasal saline washes).  If caused by bacteria, your doctor may wait to see if you will get better without treatment. You may be given antibiotic medicine if you have: ? A very bad infection. ? A weak body defense system.  If caused by growths in the nose, you may need to have surgery. Follow these instructions at home: Medicines  Take, use, or apply over-the-counter and prescription medicines only as told by your doctor. These may include nasal sprays.  If you were prescribed an antibiotic medicine, take it as told by your doctor. Do not stop taking the antibiotic even if you start to feel better. Hydrate and humidify   Drink enough water to keep your pee (urine) pale yellow.  Use a cool mist humidifier  to keep the humidity level in your home above 50%.  Breathe in steam for 10-15 minutes, 3-4 times a day, or as told by your doctor. You can do this in the bathroom while a hot shower is running.  Try not to spend time in cool or dry air. Rest  Rest as much as you can.  Sleep with your head raised (elevated).  Make sure you get enough sleep each night. General instructions   Put a warm, moist washcloth on your face 3-4 times a day, or as often as told by your doctor. This will help with discomfort.  Wash your  hands often with soap and water. If there is no soap and water, use hand sanitizer.  Do not smoke. Avoid being around people who are smoking (secondhand smoke).  Keep all follow-up visits as told by your doctor. This is important. Contact a doctor if:  You have a fever.  Your symptoms get worse.  Your symptoms do not get better within 10 days. Get help right away if:  You have a very bad headache.  You cannot stop throwing up (vomiting).  You have very bad pain or swelling around your face or eyes.  You have trouble seeing.  You feel confused.  Your neck is stiff.  You have trouble breathing. Summary  Sinusitis is swelling of your sinuses. Sinuses are hollow spaces in the bones around your face.  This condition is caused by tissues in your nose that become inflamed or swollen. This traps germs. These can lead to infection.  If you were prescribed an antibiotic medicine, take it as told by your doctor. Do not stop taking it even if you start to feel better.  Keep all follow-up visits as told by your doctor. This is important. This information is not intended to replace advice given to you by your health care provider. Make sure you discuss any questions you have with your health care provider. Document Revised: 03/26/2018 Document Reviewed: 03/26/2018 Elsevier Patient Education  2020 [employer]  Vertigo Vertigo is the feeling that you or the things around you are moving when they are not. This feeling can come and go at any time. Vertigo often goes away on its own. This condition can be dangerous if it happens when you are doing activities like driving or working with machines. Your doctor will do tests to find the cause of your vertigo. These tests will also help your doctor decide on the best treatment for you. Follow these instructions at home: Eating and drinking      Drink enough fluid to keep your pee (urine) pale yellow.  Do not drink  alcohol. Activity  Return to your normal activities as told by your doctor. Ask your doctor what activities are safe for you.  In the morning, first sit up on the side of the bed. When you feel okay, stand slowly while you hold onto something until you know that your balance is fine.  Move slowly. Avoid sudden body or head movements or certain positions, as told by your doctor.  Use a cane if you have trouble standing or walking.  Sit down right away if you feel dizzy.  Avoid doing any tasks or activities that can cause danger to you or others if you get dizzy.  Avoid bending down if you feel dizzy. Place items in your home so that they are easy for you to reach without leaning over.  Do not drive or use heavy machinery if  you feel dizzy. General instructions  Take over-the-counter and prescription medicines only as told by your doctor.  Keep all follow-up visits as told by your doctor. This is important. Contact a doctor if:  Your medicine does not help your vertigo.  You have a fever.  Your problems get worse or you have new symptoms.  Your family or friends see changes in your behavior.  The feeling of being sick to your stomach gets worse.  Your vomiting gets worse.  You lose feeling (have numbness) in part of your body.  You feel prickling and tingling in a part of your body. Get help right away if:  You have trouble moving or talking.  You are always dizzy.  You pass out (faint).  You get very bad headaches.  You feel weak in your hands, arms, or legs.  You have changes in your hearing.  You have changes in how you see (vision).  You get a stiff neck.  Bright light starts to bother you. Summary  Vertigo is the feeling that you or the things around you are moving when they are not.  Your doctor will do tests to find the cause of your vertigo.  You may be told to avoid some tasks, positions, or movements.  Contact a doctor if your medicine is not  helping, or if you have a fever, new symptoms, or a change in behavior.  Get help right away if you get very bad headaches, or if you have changes in how you speak, hear, or see. This information is not intended to replace advice given to you by your health care provider. Make sure you discuss any questions you have with your health care provider. Document Revised: 09/17/2018 Document Reviewed: 09/17/2018 Elsevier Patient Education  2020 [employer].   How to Perform the Epley Maneuver The Epley maneuver is an exercise that relieves symptoms of vertigo. Vertigo is the feeling that you or your surroundings are moving when they are not. When you feel vertigo, you may feel like the room is spinning and have trouble walking. Dizziness is a little different than vertigo. When you are dizzy, you may feel unsteady or light-headed. You can do this maneuver at home whenever you have symptoms of vertigo. You can do it up to 3 times a day until your symptoms go away. Even though the Epley maneuver may relieve your vertigo for a few weeks, it is possible that your symptoms will return. This maneuver relieves vertigo, but it does not relieve dizziness. What are the risks? If it is done correctly, the Epley maneuver is considered safe. Sometimes it can lead to dizziness or nausea that goes away after a short time. If you develop other symptoms, such as changes in vision, weakness, or numbness, stop doing the maneuver and call your health care provider. How to perform the Epley maneuver 1. Sit on the edge of a bed or table with your back straight and your legs extended or hanging over the edge of the bed or table. 2. Turn your head halfway toward the affected ear or side. 3. Lie backward quickly with your head turned until you are lying flat on your back. You may want to position a pillow under your shoulders. 4. Hold this position for 30 seconds. You may experience an attack of vertigo. This is  normal. 5. Turn your head to the opposite direction until your unaffected ear is facing the floor. 6. Hold this position for 30 seconds. You may experience  an attack of vertigo. This is normal. Hold this position until the vertigo stops. 7. Turn your whole body to the same side as your head. Hold for another 30 seconds. 8. Sit back up. You can repeat this exercise up to 3 times a day. Follow these instructions at home:  After doing the Epley maneuver, you can return to your normal activities.  Ask your health care provider if there is anything you should do at home to prevent vertigo. He or she may recommend that you: ? Keep your head raised (elevated) with two or more pillows while you sleep. ? Do not sleep on the side of your affected ear. ? Get up slowly from bed. ? Avoid sudden movements during the day. ? Avoid extreme head movement, like looking up or bending over. Contact a health care provider if:  Your vertigo gets worse.  You have other symptoms, including: ? Nausea. ? Vomiting. ? Headache. Get help right away if:  You have vision changes.  You have a severe or worsening headache or neck pain.  You cannot stop vomiting.  You have new numbness or weakness in any part of your body. Summary  Vertigo is the feeling that you or your surroundings are moving when they are not.  The Epley maneuver is an exercise that relieves symptoms of vertigo.  If the Epley maneuver is done correctly, it is considered safe. You can do it up to 3 times a day. This information is not intended to replace advice given to you by your health care provider. Make sure you discuss any questions you have with your health care provider. Document Revised: 10/06/2017 Document Reviewed: 09/13/2016 Elsevier Patient Education  2020 [employer].

## 2020-09-10 ENCOUNTER — Ambulatory Visit: Payer: 59 | Admitting: Family Medicine

## 2020-11-23 ENCOUNTER — Ambulatory Visit: Payer: 59 | Admitting: Family Medicine

## 2020-11-27 ENCOUNTER — Telehealth: Payer: Self-pay | Admitting: Family Medicine

## 2020-11-27 NOTE — Telephone Encounter (Signed)
Pt called stating that she currently has a rash all over her face. She states that her mother passed recently and that the rash has developed due to stress. Pt is requesting to have PCP send over, hydroxyzine, since she was prescribed this before. Please advise.      [facility] [address]), [place] - [address]  [address]) [place] [address]  Phone: [phone] Fax: [phone]  Hours: Not open 24 hours

## 2020-11-30 NOTE — Telephone Encounter (Signed)
Please advise 

## 2020-12-01 ENCOUNTER — Encounter: Payer: Self-pay | Admitting: Family Medicine

## 2020-12-01 ENCOUNTER — Telehealth (INDEPENDENT_AMBULATORY_CARE_PROVIDER_SITE_OTHER): Payer: 59 | Admitting: Family Medicine

## 2020-12-01 ENCOUNTER — Other Ambulatory Visit: Payer: Self-pay

## 2020-12-01 DIAGNOSIS — R21 Rash and other nonspecific skin eruption: Secondary | ICD-10-CM | POA: Diagnosis not present

## 2020-12-01 MED ORDER — HYDROXYZINE HCL 10 MG PO TABS
10.0000 mg | ORAL_TABLET | Freq: Three times a day (TID) | ORAL | 1 refills | Status: DC | PRN
Start: 2020-12-01 — End: 2021-11-03

## 2020-12-01 NOTE — Telephone Encounter (Signed)
Can we schedule her on the end of today for quick virtual?

## 2020-12-01 NOTE — Progress Notes (Signed)
Name: Vanessa Gross   MRN: [id number]    DOB: Jan 07, 1956   Date:12/01/2020       Progress Note  Subjective:    Chief Complaint  Chief Complaint  Patient presents with  . Rash    Spot of forehead and lip, itching    I connected with  Vanessa Gross on 12/01/20 at  3:40 PM EST by telephone and verified that I am speaking with the correct person using two identifiers.   I discussed the limitations, risks, security and privacy concerns of performing an evaluation and management service by telephone and the availability of in person appointments. Staff also discussed with the patient that there may be a patient responsible charge related to this service.  Patient verbalized understanding and agreed to proceed with encounter. Patient Location: home Provider Location: [facility] clinic Additional Individuals present: none  HPI  Pt presents with intermittent rash and itching that seems to break out to her face - some around her lip and patch to her forehead- recurrent, associated with stress.  Similar sx last year, hydroxyzine was effective but she has run out.  She has a small place to chest/trunk - no swelling of lips or eyes, no wheeze, SOB, stridor, HA.     Patient Active Problem List   Diagnosis Date Noted  . 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 Obesity 06/07/2016  . Allergic conjunctivitis and rhinitis 10/19/2015  . HLD (hyperlipidemia) 05/29/2015  . Insomnia 05/29/2015  . Essential hypertension, benign 05/25/2007  . 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 Smoking status: Never Smoker  . Smokeless tobacco: Never Used  Substance Use Topics  . Alcohol use: No    Alcohol/week: 0.0 standard drinks     Current Outpatient Medications:  .  amLODipine (NORVASC) 2.5 MG tablet, Take 1 tablet (2.5 mg total) by mouth daily., Disp: 90 tablet, Rfl: 3 .  aspirin EC 81 MG tablet, Take 1 tablet (81 mg total) by mouth  daily., Disp: , Rfl:  .  azelastine (ASTELIN) 0.1 % nasal spray, Place 2 sprays into both nostrils 2 (two) times daily. Use in each nostril as directed, Disp: 30 mL, Rfl: 12 .  BIOTIN PO, Take 1 tablet by mouth daily. , Disp: , Rfl:  .  Cholecalciferol 25 MCG (1000 UT) tablet, Take 1,000 Units by mouth daily., Disp: , Rfl:  .  loratadine (CLARITIN) 10 MG tablet, Take 1 tablet (10 mg total) by mouth daily., Disp: 30 tablet, Rfl: 11 .  meclizine (ANTIVERT) 25 MG tablet, Take 0.5-1 tablets (12.5-25 mg total) by mouth 3 (three) times daily as needed for dizziness., Disp: 30 tablet, Rfl: 1 .  Multiple Vitamin (MULTI-VITAMINS) TABS, Take by mouth., Disp: , Rfl:  .  rosuvastatin (CRESTOR) 10 MG tablet, Take 1 tablet (10 mg total) by mouth at bedtime., Disp: 90 tablet, Rfl: 3 .  triamterene-hydrochlorothiazide (MAXZIDE-25) 37.5-25 MG tablet, Take 1 tablet by mouth daily., Disp: 90 tablet, Rfl: 3 .  hydrOXYzine (ATARAX/VISTARIL) 10 MG tablet, Take 1-3 tablets (10-30 mg total) by mouth 3 (three) times daily as needed for itching (HIVES). (Patient not taking: Reported on 12/01/2020), Disp: 30 tablet, Rfl: 1  Allergies  Allergen Reactions  . Celecoxib   . Gabapentin     abnormal dreams    Chart Review: I personally reviewed active problem list, medication list, allergies, family history, social history, health maintenance, notes from last encounter, lab results,  imaging with the patient/caregiver today.   Review of Systems  Constitutional: Negative.   HENT: Negative.   Eyes: Negative.   Respiratory: Negative.   Cardiovascular: Negative.   Gastrointestinal: Negative.   Endocrine: Negative.   Genitourinary: Negative.   Musculoskeletal: Negative.   Skin: Negative.   Allergic/Immunologic: Negative.   Neurological: Negative.   Hematological: Negative.   Psychiatric/Behavioral: Negative.   All other systems reviewed and are negative.    Objective:    Virtual encounter, vitals limited, only  able to obtain the following There were no vitals filed for this visit. There is no height or weight on file to calculate BMI. Nursing Note and Vital Signs reviewed.  Physical Exam Vitals and nursing note reviewed.  Pulmonary:     Comments: Phonation clear, no audible wheeze or stridor Neurological:     Mental Status: She is alert.     PE limited by telephone encounter  No results found for this or any previous visit (from the past 72 hour(s)).  Assessment and Plan:     ICD-10-CM   1. Rash and nonspecific skin eruption  R21    suspected hives - tx with hydroxyzine prn and can add pepcid if needed, advised pt of appropriate f/up if not improving    -Red flags and when to present for emergency care or RTC including but not limited to new/worsening/un-resolving symptoms,  reviewed with patient at time of visit. Follow up and care instructions discussed and provided in AVS. - I discussed the assessment and treatment plan with the patient. The patient was provided an opportunity to ask questions and all were answered. The patient agreed with the plan and demonstrated an understanding of the instructions.  - The patient was advised to call back or seek an in-person evaluation if the symptoms worsen or if the condition fails to improve as anticipated.  I provided ~15 minutes of non-face-to-face time during this encounter.  Danelle Berry, PA-C 12/01/20 3:56 PM

## 2020-12-01 NOTE — Patient Instructions (Addendum)
Hydroxyzine 10-20 mg up to three times a day as needed for rash/hives/itching. If that does not manage the symptoms you can add over the counter pepcid 20 mg twice a day for a few days  If you get patchy dry skin apply vaseline at least 1-2x a day and if there are any thickened raised areas you can apply some over the counter steroid cream to that area for up to a week  If the rash spreads or seems to worsen - please follow up or let me know - because you may need different treatment.  If your medications are not covered by insurance or are not affordable - you can look for discount cash prices by looking up the medications and pharmacies at [email or web address]   Hives Hives are itchy, red, swollen areas on your skin. Hives can show up on any part of your body. Hives often fade within 24 hours (acute hives). New hives can show up after old ones fade. This can go on for many days or weeks (chronic hives). Hives do not spread from person to person (are not contagious). Hives are caused by your body's response to something that you are allergic to (allergen). These are sometimes called triggers. You can get hives right after being around a trigger, or hours later. What are the causes?  Allergies to foods.  Insect bites or stings.  Pollen.  Pets.  Latex.  Chemicals.  Spending time in sunlight, heat, or cold.  Exercise.  Stress.  Some medicines.  Viruses. This includes the common cold.  Infections caused by germs (bacteria).  Allergy shots.  Blood transfusions. Sometimes, the cause is not known. What increases the risk?  Being a woman.  Being allergic to foods such as: ? Citrus fruits. ? Milk. ? Eggs. ? Peanuts. ? Tree nuts. ? Shellfish.  Being allergic to: ? Medicines. ? Latex. ? Insects. ? Animals. ? Pollen. What are the signs or symptoms?  Raised, itchy, red or white bumps or patches on your skin. These areas may: ? Get large and swollen. ? Change in shape and  location. ? Stand alone or connect to each other over a large area of skin. ? Sting or hurt. ? Turn white when pressed in the center (blanch). In very bad cases, your hands, feet, and face may also get swollen. This may happen if hives start deeper in your skin.   How is this treated? Treatment for this condition depends on your symptoms. Treatment may include:  Using cool, wet cloths (cool compresses) or taking cool showers to stop the itching.  Medicines that help: ? Relieve itching (antihistamines). ? Reduce swelling (corticosteroids). ? Treat infection (antibiotics).  A medicine (omalizumab) that is given as a shot (injection). Your doctor may prescribe this if you have hives that do not get better even after other treatments.  In very bad cases, you may need a shot of a medicine called epinephrine to prevent a life-threatening allergic reaction (anaphylaxis). Follow these instructions at home: Medicines  Take or apply over-the-counter and prescription medicines only as told by your doctor.  If you were prescribed an antibiotic medicine, use it as told by your doctor. Do not stop using it even if you start to feel better. Skin care  Apply cool, wet cloths to the hives.  Do not scratch your skin. Do not rub your skin. General instructions  Do not take hot showers or baths. This can make itching worse.  Do not wear tight clothes.  Use  sunscreen and wear clothes that cover your skin when you are outside.  Avoid any triggers that cause your hives. Keep a journal to help track what causes your hives. Write down: ? What medicines you take. ? What you eat and drink. ? What products you use on your skin.  Keep all follow-up visits as told by your doctor. This is important. Contact a doctor if:  Your symptoms are not better with medicine.  Your joints hurt or are swollen. Get help right away if:  You have a fever.  You have pain in your belly (abdomen).  Your tongue  or lips are swollen.  Your eyelids are swollen.  Your chest or throat feels tight.  You have trouble breathing or swallowing. These symptoms may be an emergency. Do not wait to see if the symptoms will go away. Get medical help right away. Call your local emergency services (911 in the [place] Do not drive yourself to the hospital. Summary  Hives are itchy, red, swollen areas on your skin.  Treatment for this condition depends on your symptoms.  Avoid things that cause your hives. Keep a journal to help track what causes your hives.  Take and apply over-the-counter and prescription medicines only as told by your doctor.  Keep all follow-up visits as told by your doctor. This is important. This information is not intended to replace advice given to you by your health care provider. Make sure you discuss any questions you have with your health care provider. Document Revised: 12/13/2019 Document Reviewed: 05/09/2018 Elsevier Patient Education  2021 [employer].

## 2020-12-09 ENCOUNTER — Ambulatory Visit: Payer: 59 | Admitting: Family Medicine

## 2021-01-04 ENCOUNTER — Other Ambulatory Visit: Payer: Self-pay

## 2021-01-04 DIAGNOSIS — I10 Essential (primary) hypertension: Secondary | ICD-10-CM

## 2021-01-04 MED ORDER — AMLODIPINE BESYLATE 2.5 MG PO TABS: 2.5000 mg | ORAL_TABLET | Freq: Every day | ORAL | 0 refills | Status: DC

## 2021-01-04 MED ORDER — TRIAMTERENE-HCTZ 37.5-25 MG PO TABS: 1.0000 | ORAL_TABLET | Freq: Every day | ORAL | 0 refills | Status: DC

## 2021-01-05 ENCOUNTER — Ambulatory Visit: Payer: Self-pay | Admitting: Family Medicine

## 2021-01-05 ENCOUNTER — Telehealth: Payer: Self-pay

## 2021-01-05 DIAGNOSIS — J329 Chronic sinusitis, unspecified: Secondary | ICD-10-CM

## 2021-01-05 DIAGNOSIS — J310 Chronic rhinitis: Secondary | ICD-10-CM

## 2021-01-05 MED ORDER — LORATADINE 10 MG PO TABS: 10.0000 mg | ORAL_TABLET | Freq: Every day | ORAL | 3 refills | Status: DC

## 2021-01-05 NOTE — Telephone Encounter (Signed)
Has a 30 day supply with 11 refills can this be changed to a 90 day supply with 3

## 2021-01-05 NOTE — Telephone Encounter (Signed)
Pt requesting a 90day supply on her loratadine please send to [facility]

## 2021-04-06 ENCOUNTER — Telehealth: Payer: Self-pay

## 2021-04-06 NOTE — Telephone Encounter (Signed)
Copied from CRM [id number]. Topic: General - Inquiry >> Apr 06, 2021  3:49 PM Aretta Nip wrote: Reason for CRM: Pt wanting to have more meds called in as she is having back pain running into her left leg, has already been to [facility] at [facility] last and prescribed Oxycodone and prednisone and it is still giving her a fit. Offered to request appt with Jamesetta Orleans but really just wants Dr Carlynn Purl to call her in something want CB FU at [phone]

## 2021-04-07 NOTE — Telephone Encounter (Signed)
Pt scheduled  

## 2021-04-09 ENCOUNTER — Other Ambulatory Visit: Payer: Self-pay

## 2021-04-12 ENCOUNTER — Ambulatory Visit: Payer: Self-pay | Admitting: Unknown Physician Specialty

## 2021-04-12 ENCOUNTER — Encounter: Payer: Self-pay | Admitting: Unknown Physician Specialty

## 2021-04-12 ENCOUNTER — Other Ambulatory Visit: Payer: Self-pay

## 2021-04-12 ENCOUNTER — Ambulatory Visit: Payer: Self-pay | Admitting: Family Medicine

## 2021-04-12 VITALS — BP 130/84 | HR 94 | Temp 98.1°F | Resp 14 | Ht 62.0 in | Wt 201.5 lb

## 2021-04-12 DIAGNOSIS — M5432 Sciatica, left side: Secondary | ICD-10-CM

## 2021-04-12 LAB — POCT URINALYSIS DIPSTICK
Bilirubin, UA: NEGATIVE
Blood, UA: NEGATIVE
Glucose, UA: NEGATIVE
Ketones, UA: NEGATIVE
Leukocytes, UA: NEGATIVE
Nitrite, UA: NEGATIVE
Odor: NORMAL
Protein, UA: NEGATIVE
Spec Grav, UA: 1.015 (ref 1.010–1.025)
Urobilinogen, UA: 0.2 E.U./dL
pH, UA: 6.5 (ref 5.0–8.0)

## 2021-04-12 MED ORDER — METHYLPREDNISOLONE 4 MG PO TBPK: ORAL_TABLET | ORAL | 0 refills | Status: DC

## 2021-04-12 MED ORDER — HYDROCODONE-ACETAMINOPHEN 5-325 MG PO TABS: 1.0000 | ORAL_TABLET | Freq: Every evening | ORAL | 0 refills | Status: DC | PRN

## 2021-04-12 NOTE — Progress Notes (Signed)
BP 130/84   Pulse 94   Temp 98.1 F (36.7 C)   Resp 14   Ht 5\' 2"  (1.575 m)   Wt 201 lb 8 oz (91.4 kg)   SpO2 99%   BMI 36.85 kg/m    Subjective:    Patient ID: , female    DOB: 11-17-1955, 65 y.o.   MRN: [id number]  HPI: Vanessa Gross is a 65 y.o. female  Chief Complaint  Patient presents with  . Sciatica    Left flank pain, went to urgent care last week, still having pain   Pt is here for left leg pain which is keeping her up at night.  States this started about 9 days ago, perhaps after lifting a case of water the day before.  Describes as an aching pain that starts lateral hip and travels to lower leg.  Worse pain in the lower leg and woke her up last night.  Describes weakness in that leg.  Went to urgent care about 8 days ago and given Flexeril, Prednisone, and Hydrocodone.  It did not resolve the pain but is seemed to improve somewhat.   Pt states she is unable to go to a specialist due to lack of insurance.    Relevant past medical, surgical, family and social history reviewed and updated as indicated. Interim medical history since our last visit reviewed. Allergies and medications reviewed and updated.  Review of Systems  Per HPI unless specifically indicated above     Objective:    BP 130/84   Pulse 94   Temp 98.1 F (36.7 C)   Resp 14   Ht 5\' 2"  (1.575 m)   Wt 201 lb 8 oz (91.4 kg)   SpO2 99%   BMI 36.85 kg/m   Wt Readings from Last 3 Encounters:  04/12/21 201 lb 8 oz (91.4 kg)  09/09/20 198 lb 11.2 oz (90.1 kg)  09/07/20 198 lb (89.8 kg)    Physical Exam Constitutional:      General: She is not in acute distress.    Appearance: Normal appearance. She is well-developed.  HENT:     Head: Normocephalic and atraumatic.  Eyes:     General: Lids are normal. No scleral icterus.       Right eye: No discharge.        Left eye: No discharge.     Conjunctiva/sclera: Conjunctivae normal.  Cardiovascular:     Rate and Rhythm: Normal  rate.  Pulmonary:     Effort: Pulmonary effort is normal.  Abdominal:     Palpations: There is no hepatomegaly or splenomegaly.  Musculoskeletal:        General: Normal range of motion.  Skin:    Coloration: Skin is not pale.     Findings: No rash.  Neurological:     Mental Status: She is alert and oriented to person, place, and time.  Psychiatric:        Behavior: Behavior normal.        Thought Content: Thought content normal.        Judgment: Judgment normal.    1 plus reflexes left patella and ankle.  3 plus on right. 2 plus strength left leg with 3 plus on right  Results for orders placed or performed in visit on 04/12/21  POCT urinalysis dipstick  Result Value Ref Range   Color, UA yellow    Clarity, UA clear    Glucose, UA Negative Negative   Bilirubin, UA  neg    Ketones, UA neg    Spec Grav, UA 1.015 1.010 - 1.025   Blood, UA neg    pH, UA 6.5 5.0 - 8.0   Protein, UA Negative Negative   Urobilinogen, UA 0.2 0.2 or 1.0 E.U./dL   Nitrite, UA neg    Leukocytes, UA Negative Negative   Appearance clear    Odor normal       Assessment & Plan:   Problem List Items Addressed This Visit   None   Visit Diagnoses    Sciatica of left side    -  Primary   L4-L5 distribution. Hypreflexic and has weakness I would like to refer but pt lacks insurance.  Will refer to social work plus orthopedics for assistance.    Relevant Medications   cyclobenzaprine (FLEXERIL) 10 MG tablet   Other Relevant Orders   POCT urinalysis dipstick (Completed)   AMB Referral to Community Care Coordinaton   AMB referral to orthopedics      Rx for Medrol dose pack. Social work Therapist, sports.  Refer to [facility],     Follow up plan: Return for With orthopedics.

## 2021-04-13 ENCOUNTER — Telehealth: Payer: Self-pay | Admitting: *Deleted

## 2021-04-13 NOTE — Chronic Care Management (AMB) (Signed)
  Care Management   Note  04/13/2021 Name: ZALIA HAUTALA MRN: [id number] DOB: 07/01/1956  Lucio Edward Woodfin is a 65 y.o. year old female who is a primary care patient of Danelle Berry, [place]. I reached out to [facility] by phone today in response to a referral sent by Ms. Darrien M Tomas's PCP, Danelle Berry, PA-C.  Ms. Guizar was given information about care management services today including:  1. Care management services include personalized support from designated clinical staff supervised by her physician, including individualized plan of care and coordination with other care providers 2. 24/7 contact phone numbers for assistance for urgent and routine care needs. 3. The patient may stop care management services at any time by phone call to the office staff.  Patient agreed to services and verbal consent obtained.   Follow up plan: Telephone appointment with care management team member scheduled for:04/14/2021  [facility] Guide, Embedded Care Coordination [facility] Management

## 2021-04-14 ENCOUNTER — Ambulatory Visit: Payer: Self-pay | Admitting: *Deleted

## 2021-04-14 DIAGNOSIS — I10 Essential (primary) hypertension: Secondary | ICD-10-CM

## 2021-04-14 DIAGNOSIS — M5432 Sciatica, left side: Secondary | ICD-10-CM

## 2021-04-14 NOTE — Patient Instructions (Signed)
Visit Information  PATIENT GOALS:  Goals Addressed            This Visit's Progress   . Find Help in My Community       Timeframe:  Short-Term Goal Priority:  High Start Date:       04/14/21                      Expected End Date:   05/14/21                    Follow Up Date 05/121   - follow-up on any referrals for help I am given - think ahead to make sure my need does not become an emergency - have a back-up plan    Why is this important?    Knowing how and where to find help for yourself or family in your neighborhood and community is an important skill.   You will want to take some steps to learn how.    Notes:        Ms. Kue was given information about Care Management services by the embedded care coordination team including:  1. Care Management services include personalized support from designated clinical staff supervised by her physician, including individualized plan of care and coordination with other care providers 2. 24/7 contact phone numbers for assistance for urgent and routine care needs. 3. The patient may stop CCM services at any time (effective at the end of the month) by phone call to the office staff.  Patient agreed to services and verbal consent obtained.   The patient verbalized understanding of instructions, educational materials, and care plan provided today and declined offer to receive copy of patient instructions, educational materials, and care plan.   Telephone follow up appointment with care management team member scheduled for: 05/03/21   Verna Czech, LCSW Clinical Social Worker  [facility] Care Management [phone]

## 2021-04-14 NOTE — Chronic Care Management (AMB) (Signed)
Care Management Clinical Social Work Note  04/14/2021 Name: Vanessa Gross MRN: [id number] DOB: Mar 04, 1956  Vanessa Gross is a 65 y.o. year old female who is a primary care patient of Danelle Berry, [place].  The Care Management team was consulted for assistance with chronic disease management and coordination needs.  Engaged with patient by telephone for initial visit in response to provider referral for social work chronic care management and care coordination services  Consent to Services:  Vanessa Gross was given information about Care Management services today including:  1. Care Management services includes personalized support from designated clinical staff supervised by her physician, including individualized plan of care and coordination with other care providers 2. 24/7 contact phone numbers for assistance for urgent and routine care needs. 3. The patient may stop case management services at any time by phone call to the office staff.  Patient agreed to services and consent obtained.   Assessment: Review of patient past medical history, allergies, medications, and health status, including review of relevant consultants reports was performed today as part of a comprehensive evaluation and provision of chronic care management and care coordination services.  SDOH (Social Determinants of Health) assessments and interventions performed:    Advanced Directives Status: Not addressed in this encounter.  Care Plan  Allergies  Allergen Reactions  . Celecoxib   . Gabapentin     abnormal dreams    Outpatient Encounter Medications as of 04/14/2021  Medication Sig Note  . amLODipine (NORVASC) 2.5 MG tablet Take 1 tablet (2.5 mg total) by mouth daily.   Marland Kitchen aspirin EC 81 MG tablet Take 1 tablet (81 mg total) by mouth daily.   Marland Kitchen azelastine (ASTELIN) 0.1 % nasal spray Place 2 sprays into both nostrils 2 (two) times daily. Use in each nostril as directed   . BIOTIN PO Take 1 tablet by mouth daily.     . Cholecalciferol 25 MCG (1000 UT) tablet Take 1,000 Units by mouth daily.   . cyclobenzaprine (FLEXERIL) 10 MG tablet Take by mouth.   Marland Kitchen HYDROcodone-acetaminophen (NORCO/VICODIN) 5-325 MG tablet Take 1 tablet by mouth at bedtime as needed.   . hydrOXYzine (ATARAX/VISTARIL) 10 MG tablet Take 1-3 tablets (10-30 mg total) by mouth 3 (three) times daily as needed for itching (HIVES).   Marland Kitchen loratadine (CLARITIN) 10 MG tablet Take 1 tablet (10 mg total) by mouth daily.   . meclizine (ANTIVERT) 25 MG tablet Take 0.5-1 tablets (12.5-25 mg total) by mouth 3 (three) times daily as needed for dizziness. 12/01/2020: prn  . methylPREDNISolone (MEDROL DOSEPAK) 4 MG TBPK tablet As directed   . Multiple Vitamin (MULTI-VITAMINS) TABS Take by mouth. 10/19/2015: Received from: [facility]  . predniSONE (DELTASONE) 20 MG tablet Take 20 mg by mouth 2 (two) times daily. (Patient not taking: Reported on 04/12/2021)   . rosuvastatin (CRESTOR) 10 MG tablet Take 1 tablet (10 mg total) by mouth at bedtime.   . triamterene-hydrochlorothiazide (MAXZIDE-25) 37.5-25 MG tablet Take 1 tablet by mouth daily.    No facility-administered encounter medications on file as of 04/14/2021.    Patient Active Problem List   Diagnosis Date Noted  . 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 Obesity 06/07/2016  . Allergic conjunctivitis and rhinitis 10/19/2015  . HLD (hyperlipidemia) 05/29/2015  . Insomnia 05/29/2015  . Essential hypertension, benign 05/25/2007  . 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 Financial constraints related to lack of medical insurance . Suicidal Ideation/Homicidal  Ideation: No  Clinical Social Work Goal(s):  . patient will work with SW to address concerns related to her insurance coverage  Interventions: . Patient interviewed and appropriate assessments performed: PHQ 2 . PHQ 9 .  Patient discussed issues related to chronic pain and recent release from her previous insurance provider  . Patient discussed concern due to being uninsured and previous follow up with the [facility] but would like to remain with current provider. Steele Sizer with patient's insurance broker [facility] [employer] [phone]) re: insurance plans that patient may be eligible for . [employer] identified as a plan that patient may be eligible for and was contacted to confirm that they are in network . Patient has agreed to new plan coverage confirmed to begin on 05/07/21 . Assisted patient/caregiver with obtaining information about health plan benefits however requested that patient's broker provide patient with specific details regarding insurance coverage . Active listening / Reflection utilized .  Emotional Support Provided  Patient Self Care Activities:  . Performs ADL's independently . Performs IADL's independently . Strong family or social support  Patient Coping Strengths:  . Journalist, newspaper . Able to Communicate Effectively  Patient Self Care Deficits:  . Insurance coverage challenges         Follow Up Plan: SW will follow up with patient by phone over the next 7-14 buisness days  Santhosh Gulino, LCSW Clinical Social Worker  [facility] Care Management [phone]

## 2021-05-03 ENCOUNTER — Ambulatory Visit: Payer: Self-pay | Admitting: *Deleted

## 2021-05-03 DIAGNOSIS — M5432 Sciatica, left side: Secondary | ICD-10-CM

## 2021-05-03 DIAGNOSIS — I10 Essential (primary) hypertension: Secondary | ICD-10-CM

## 2021-05-03 NOTE — Chronic Care Management (AMB) (Signed)
Care Management Clinical Social Work Note  05/03/2021 Name: KYE SILVERSTEIN MRN: [id number] DOB: 1956-07-07  Lucio Edward Nations is a 65 y.o. year old female who is a primary care patient of Danelle Berry, [place].  The Care Management team was consulted for assistance with chronic disease management and coordination needs.  Engaged with patient by telephone for follow up visit in response to provider referral for social work chronic care management and care coordination services  Consent to Services:  Ms. Garr was given information about Care Management services today including:  Care Management services includes personalized support from designated clinical staff supervised by her physician, including individualized plan of care and coordination with other care providers 24/7 contact phone numbers for assistance for urgent and routine care needs. The patient may stop case management services at any time by phone call to the office staff.  Patient agreed to services and consent obtained.   Assessment: Review of patient past medical history, allergies, medications, and health status, including review of relevant consultants reports was performed today as part of a comprehensive evaluation and provision of chronic care management and care coordination services.  SDOH (Social Determinants of Health) assessments and interventions performed:    Advanced Directives Status: Not addressed in this encounter.  Care Plan  Allergies  Allergen Reactions   Celecoxib    Gabapentin     abnormal dreams    Outpatient Encounter Medications as of 05/03/2021  Medication Sig Note   amLODipine (NORVASC) 2.5 MG tablet Take 1 tablet (2.5 mg total) by mouth daily.    aspirin EC 81 MG tablet Take 1 tablet (81 mg total) by mouth daily.    azelastine (ASTELIN) 0.1 % nasal spray Place 2 sprays into both nostrils 2 (two) times daily. Use in each nostril as directed    BIOTIN PO Take 1 tablet by mouth daily.      Cholecalciferol 25 MCG (1000 UT) tablet Take 1,000 Units by mouth daily.    cyclobenzaprine (FLEXERIL) 10 MG tablet Take by mouth.    HYDROcodone-acetaminophen (NORCO/VICODIN) 5-325 MG tablet Take 1 tablet by mouth at bedtime as needed.    hydrOXYzine (ATARAX/VISTARIL) 10 MG tablet Take 1-3 tablets (10-30 mg total) by mouth 3 (three) times daily as needed for itching (HIVES).    loratadine (CLARITIN) 10 MG tablet Take 1 tablet (10 mg total) by mouth daily.    meclizine (ANTIVERT) 25 MG tablet Take 0.5-1 tablets (12.5-25 mg total) by mouth 3 (three) times daily as needed for dizziness. 12/01/2020: prn   methylPREDNISolone (MEDROL DOSEPAK) 4 MG TBPK tablet As directed    Multiple Vitamin (MULTI-VITAMINS) TABS Take by mouth. 10/19/2015: Received from: [facility]   predniSONE (DELTASONE) 20 MG tablet Take 20 mg by mouth 2 (two) times daily. (Patient not taking: Reported on 04/12/2021)    rosuvastatin (CRESTOR) 10 MG tablet Take 1 tablet (10 mg total) by mouth at bedtime.    triamterene-hydrochlorothiazide (MAXZIDE-25) 37.5-25 MG tablet Take 1 tablet by mouth daily.    No facility-administered encounter medications on file as of 05/03/2021.    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 05/25/2007    Conditions to be addressed/monitored:  Sciatica of left side ; Financial constraints related to lack  of insurance  Care Plan : General Social Work (Adult)  Updates made by Wenda Overland, LCSW since 05/03/2021 12:00 AM     Problem: CHL AMB "PATIENT-SPECIFIC PROBLEM"   Note:   Current Barriers:  Financial constraints related to lack of medical insurance Suicidal Ideation/Homicidal Ideation:  No  Clinical Social Work Goal(s):  patient will work with SW to address concerns related to her insurance coverage  Interventions:  Patient continues to discuss issues related to chronic pain and recent release from her previous insurance provider  Patient continues to discuss concern due to being uninsured and previous follow up with the [facility] but would like to remain with current provider. [employer] identified as a plan that patient may be eligible for and was contacted to confirm that they are in network however. Patient agreed to the plan, however she has now received a bill over the amount she is willing to pay Collaboration phone call to [employer] [phone] who states that she is working on a resolution for this and will call patient back by the end of the day  Assisted patient/caregiver with obtaining information about health plan benefits however continued to recommend that patient's broker provide patient with specific details regarding insurance coverage Active listening / Reflection utilized  Emotional Support Provided  Patient Self Care Activities:  Performs ADL's independently Performs IADL's independently Strong family or social support  Patient Coping Strengths:  Self Advocate Able to Communicate Effectively  Patient Self Care Deficits:  Insurance coverage challenges         Follow Up Plan: SW will follow up with patient by phone over the next 7-14 business days  [place], [place] Clinical Social Worker  [facility] Care Management [phone]

## 2021-05-03 NOTE — Patient Instructions (Signed)
Visit Information   Goals Addressed             This Visit's Progress    Find Help in My Community       Timeframe:  Short-Term Goal Priority:  High Start Date:       04/14/21                      Expected End Date:   05/14/21                    Follow Up Date 05/121   - follow-up on any referrals for help I am given - think ahead to make sure my need does not become an emergency - have a back-up plan  -continue to follow up with insurance agent regarding insurance coverage and options   Why is this important?   Knowing how and where to find help for yourself or family in your neighborhood and community is an important skill.  You will want to take some steps to learn how.    Notes:          The patient verbalized understanding of instructions, educational materials, and care plan provided today and declined offer to receive copy of patient instructions, educational materials, and care plan.   Telephone follow up appointment with care management team member scheduled for:05/14/21  Verna Czech, LCSW Clinical Social Worker  [facility] Care Management [phone]

## 2021-05-14 ENCOUNTER — Ambulatory Visit: Payer: Self-pay | Admitting: *Deleted

## 2021-05-14 DIAGNOSIS — M5432 Sciatica, left side: Secondary | ICD-10-CM

## 2021-05-14 DIAGNOSIS — I10 Essential (primary) hypertension: Secondary | ICD-10-CM

## 2021-05-14 NOTE — Chronic Care Management (AMB) (Addendum)
Care Management Clinical Social Work Note  05/14/2021 Name: Vanessa Gross MRN: [id number] DOB: 1956-06-28  Vanessa Gross is a 65 y.o. year old female who is a primary care patient of Danelle Berry, [place].  The Care Management team was consulted for assistance with chronic disease management and coordination needs.  Engaged with patient by telephone for follow up visit in response to provider referral for social work chronic care management and care coordination services  Assessment: Review of patient past medical history, allergies, medications, and health status, including review of relevant consultants reports was performed today as part of a comprehensive evaluation and provision of chronic care management and care coordination services.  SDOH (Social Determinants of Health) assessments and interventions performed:    Advanced Directives Status: Not addressed in this encounter.  Care Plan  Allergies  Allergen Reactions   Celecoxib    Gabapentin     abnormal dreams    Outpatient Encounter Medications as of 05/14/2021  Medication Sig Note   amLODipine (NORVASC) 2.5 MG tablet Take 1 tablet (2.5 mg total) by mouth daily.    aspirin EC 81 MG tablet Take 1 tablet (81 mg total) by mouth daily.    azelastine (ASTELIN) 0.1 % nasal spray Place 2 sprays into both nostrils 2 (two) times daily. Use in each nostril as directed    BIOTIN PO Take 1 tablet by mouth daily.     Cholecalciferol 25 MCG (1000 UT) tablet Take 1,000 Units by mouth daily.    cyclobenzaprine (FLEXERIL) 10 MG tablet Take by mouth.    HYDROcodone-acetaminophen (NORCO/VICODIN) 5-325 MG tablet Take 1 tablet by mouth at bedtime as needed.    hydrOXYzine (ATARAX/VISTARIL) 10 MG tablet Take 1-3 tablets (10-30 mg total) by mouth 3 (three) times daily as needed for itching (HIVES).    loratadine (CLARITIN) 10 MG tablet Take 1 tablet (10 mg total) by mouth daily.    meclizine (ANTIVERT) 25 MG tablet Take 0.5-1 tablets (12.5-25 mg  total) by mouth 3 (three) times daily as needed for dizziness. 12/01/2020: prn   methylPREDNISolone (MEDROL DOSEPAK) 4 MG TBPK tablet As directed    Multiple Vitamin (MULTI-VITAMINS) TABS Take by mouth. 10/19/2015: Received from: [facility]   predniSONE (DELTASONE) 20 MG tablet Take 20 mg by mouth 2 (two) times daily. (Patient not taking: Reported on 04/12/2021)    rosuvastatin (CRESTOR) 10 MG tablet Take 1 tablet (10 mg total) by mouth at bedtime.    triamterene-hydrochlorothiazide (MAXZIDE-25) 37.5-25 MG tablet Take 1 tablet by mouth daily.    No facility-administered encounter medications on file as of 05/14/2021.    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  Care Plan : General Social Work (Adult)  Updates made by Wenda Overland, LCSW since 05/14/2021 12:00 AM     Problem: CHL AMB "PATIENT-SPECIFIC PROBLEM"   Note:   Current Barriers:  Financial constraints related to lack of medical insurance Suicidal Ideation/Homicidal Ideation: No  Clinical Social Work Goal(s):  patient will work with SW to address concerns related to her insurance coverage  Interventions:  Patient continues to discuss issues related to chronic  pain and recent release from her previous insurance provider  [employer] identified as a plan that patient may be eligible for and was contacted to confirm that they are in network.  Patient agreed to the plan and has now worked out the bill that she received with the help from her broker-now she is now just responsible for the monthly  payment Assisted patient with understanding health plan benefits however continued to recommend that patient's broker provide patient with specific details regarding insurance coverage and monthly payments Patient verbalize no additional community resource needs at this time Active listening / Reflection utilized  Industrial/product designer Provided related to her medical and insurance coverage challenges.  Patient Self Care Activities:  Performs ADL's independently Performs IADL's independently Strong family or social support  Patient Coping Strengths:  Self Advocate Able to Communicate Effectively  Patient Self Care Deficits:  Insurance coverage challenges         Follow Up Plan: Client will reach out to this social worker with any additional community resource needs  [employer], LCSW Clinical Social Worker  [facility] Care Management [phone]

## 2021-05-14 NOTE — Patient Instructions (Signed)
Visit Information   Goals Addressed             This Visit's Progress    Find Help in My Community       Timeframe:  Short-Term Goal Priority:  High Start Date:       04/14/21                      Expected End Date:   05/14/21                    Follow Up Date 05/07/21   - follow-up on any referrals for help I am given - think ahead to make sure my need does not become an emergency - have a back-up plan  -continue to follow up with insurance agent regarding insurance coverage and options   Why is this important?   Knowing how and where to find help for yourself or family in your neighborhood and community is an important skill.  You will want to take some steps to learn how.    Notes:          The patient verbalized understanding of instructions, educational materials, and care plan provided today and declined offer to receive copy of patient instructions, educational materials, and care plan.   No further follow up needed, patient will call this Child psychotherapist with any additional community resource needs  [employer], LCSW Clinical Social Worker  [facility] [phone]

## 2021-05-17 ENCOUNTER — Other Ambulatory Visit: Payer: Self-pay | Admitting: Family Medicine

## 2021-05-17 DIAGNOSIS — I10 Essential (primary) hypertension: Secondary | ICD-10-CM

## 2021-07-27 ENCOUNTER — Other Ambulatory Visit: Payer: Self-pay | Admitting: Family Medicine

## 2021-07-27 DIAGNOSIS — I10 Essential (primary) hypertension: Secondary | ICD-10-CM

## 2021-08-11 IMAGING — MG DIGITAL SCREENING BILAT W/ TOMO W/ CAD
6 of 10 series · 6 of 30 positions shown · non-contrast
Comparison: Previous exam(s).

CLINICAL DATA: Screening.

EXAM:
DIGITAL SCREENING BILATERAL MAMMOGRAM WITH TOMO AND CAD

[R MLO synth-2D]
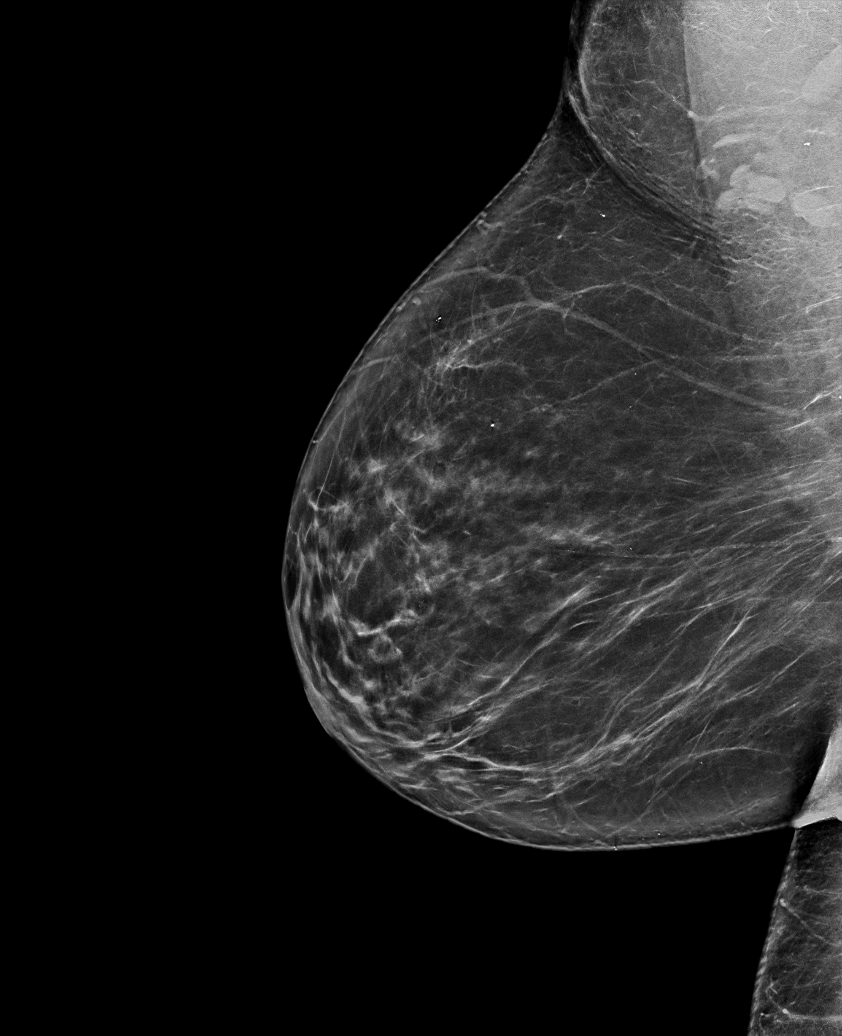

[L MLO synth-2D]
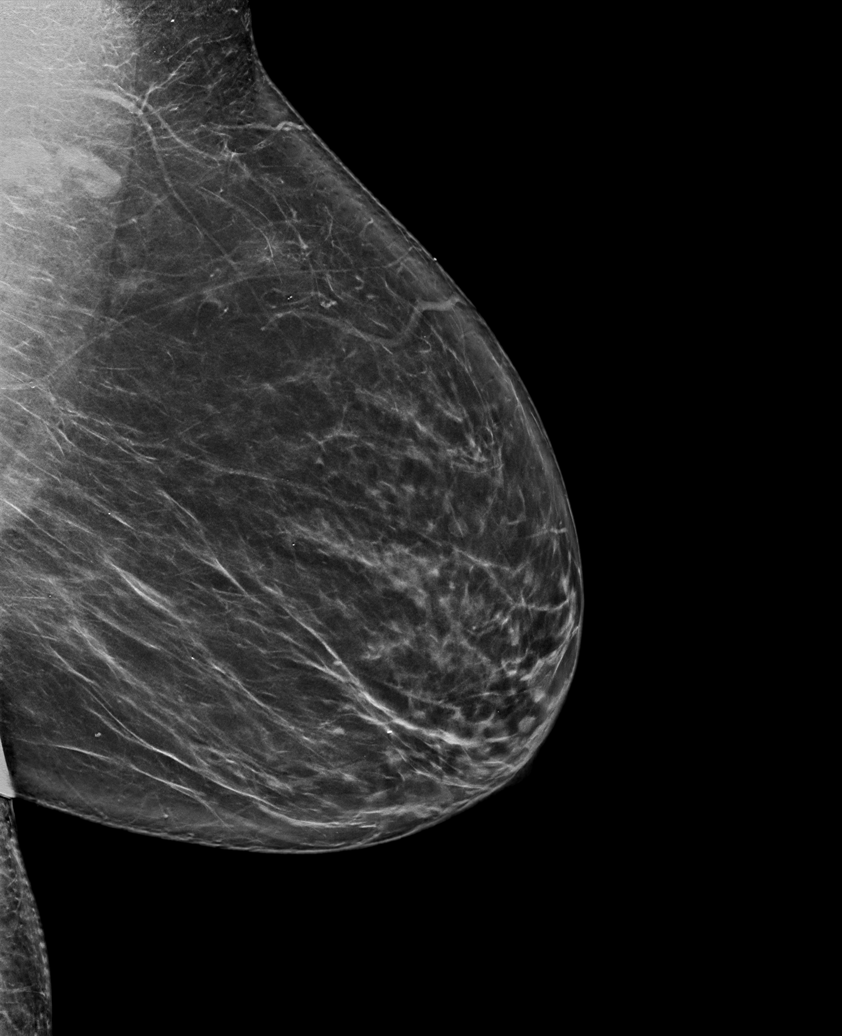

[R CC synth-2D (1 of 2)]
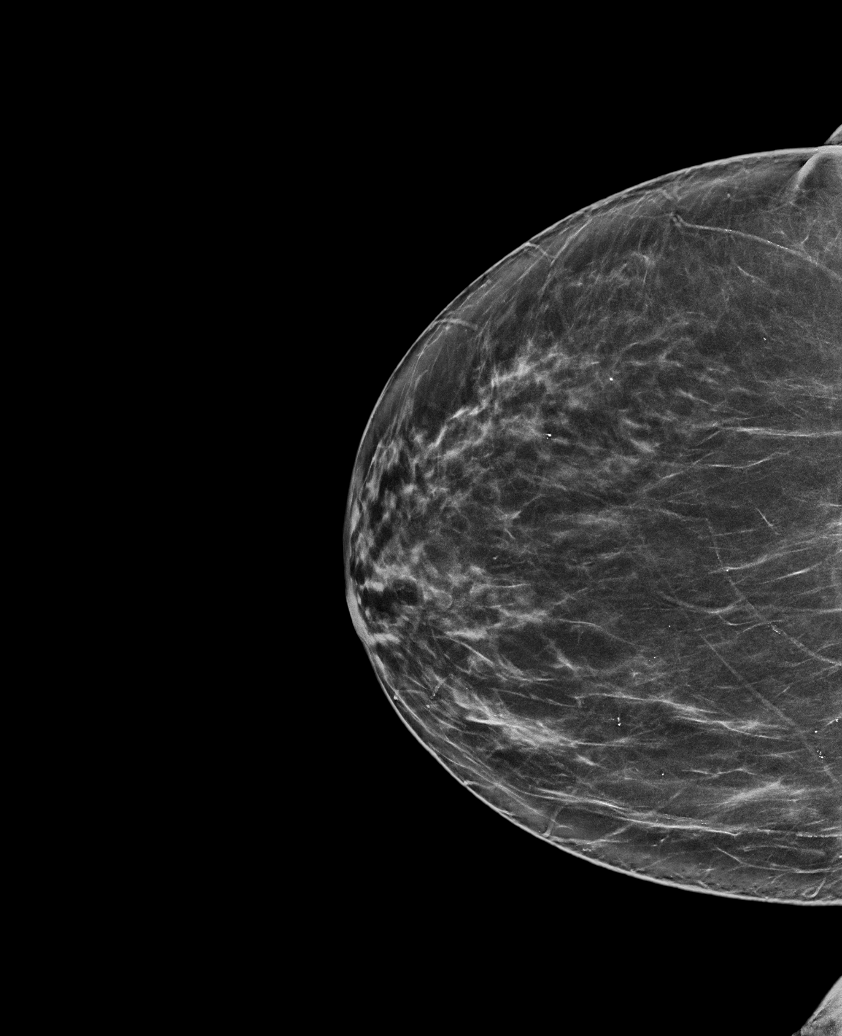

[R CC synth-2D (2 of 2)]
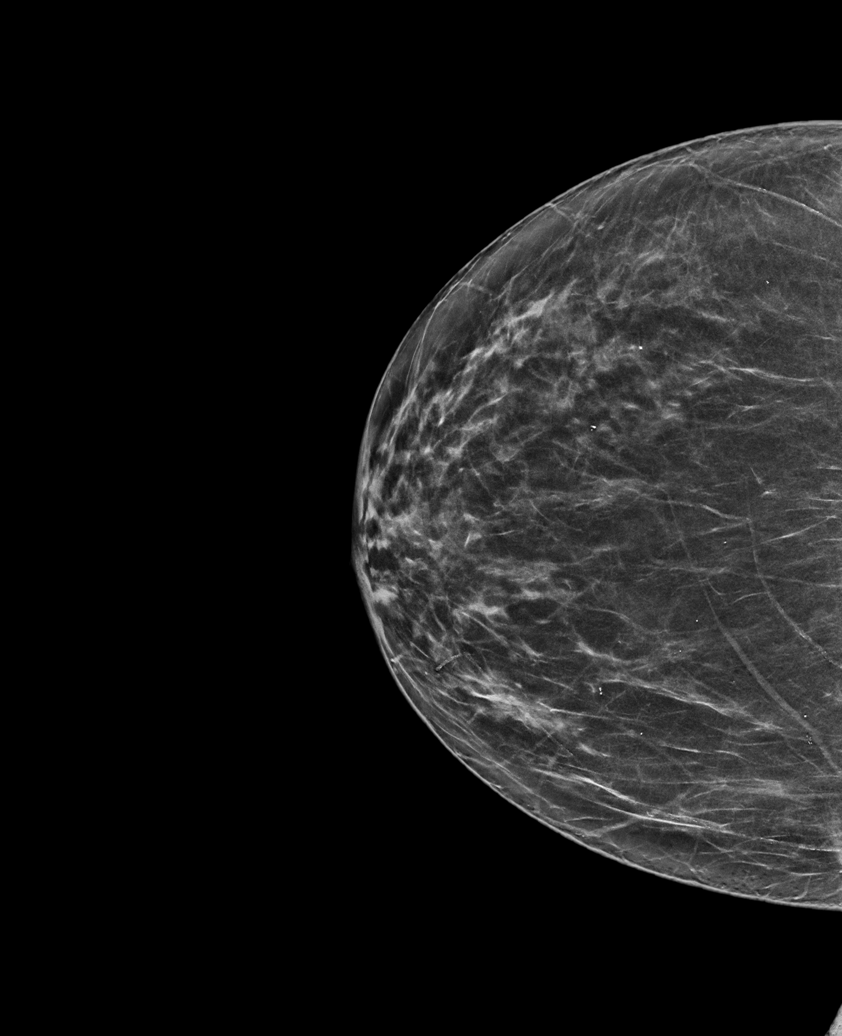

[L CC synth-2D]
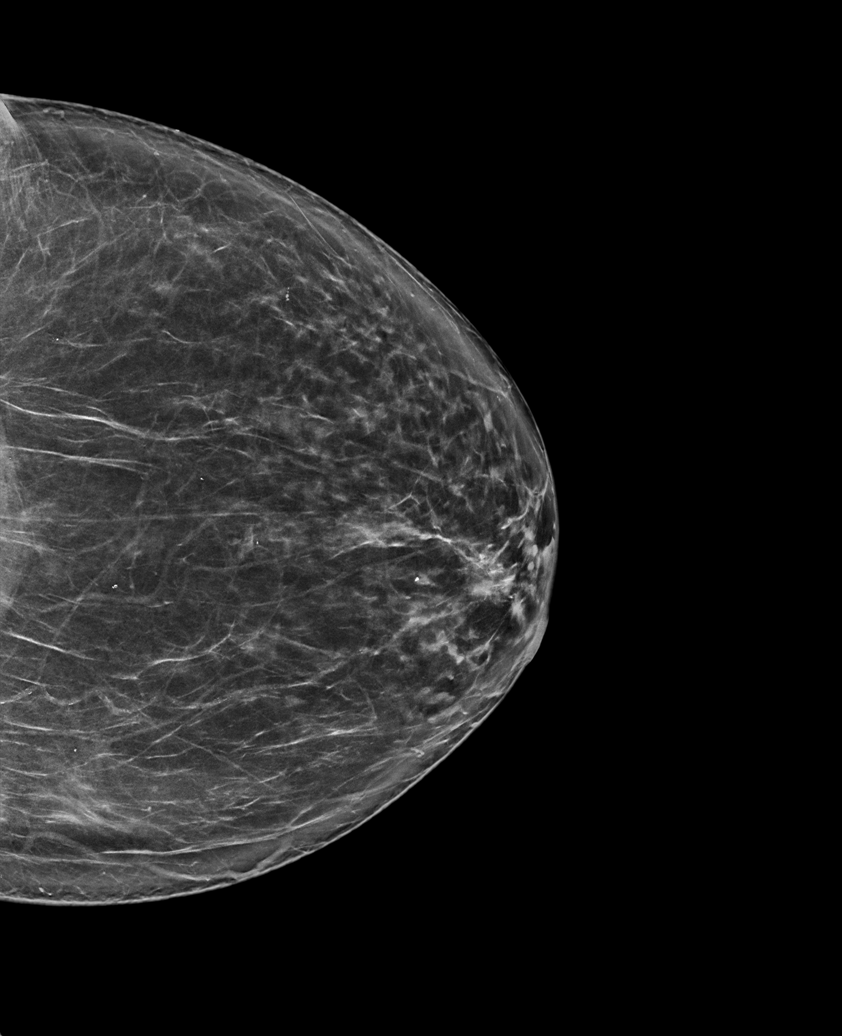

[L CC tomo · tomo slice 41/81.0]
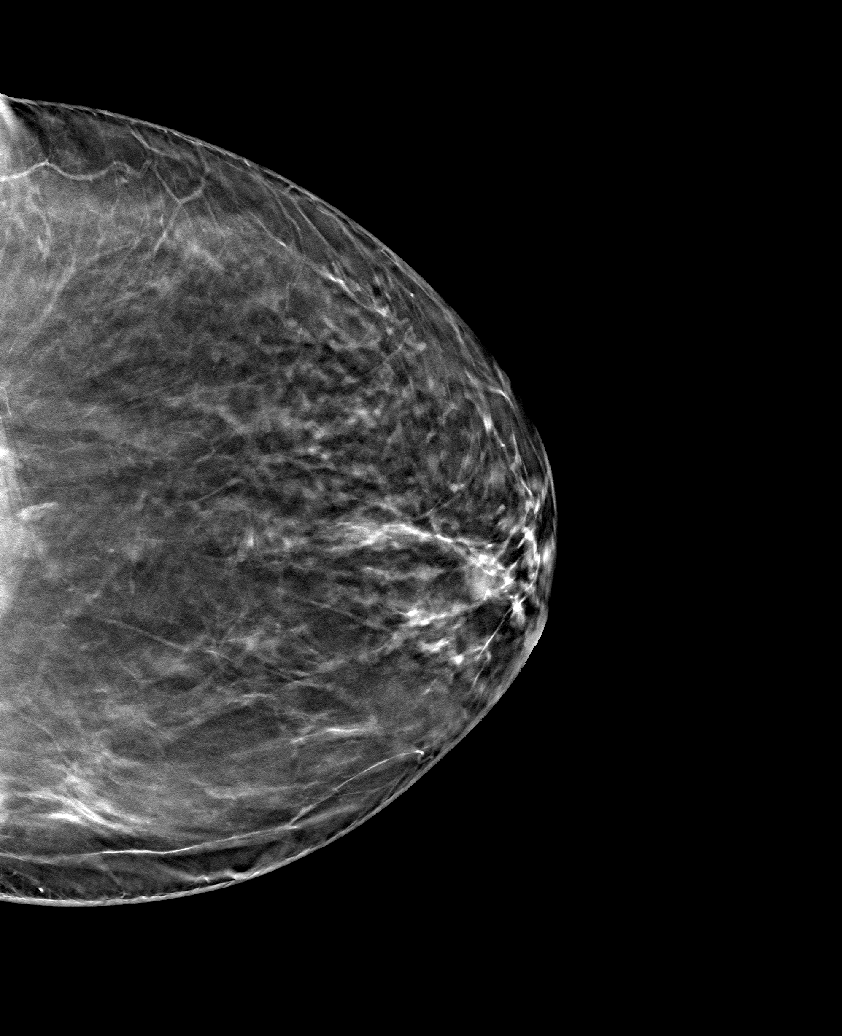

[6 of 30 positions shown; findings below may reference images not displayed]

ACR Breast Density Category b: There are scattered areas of
fibroglandular density.
FINDINGS: There are no findings suspicious for malignancy. Images were
processed with CAD.
IMPRESSION: No mammographic evidence of malignancy. A result letter of this
screening mammogram will be mailed directly to the patient.

RECOMMENDATION:
Screening mammogram in one year. (Code:[id number])

BI-RADS CATEGORY  1: Negative.

## 2021-08-13 ENCOUNTER — Other Ambulatory Visit: Payer: Self-pay | Admitting: Family Medicine

## 2021-08-13 DIAGNOSIS — I10 Essential (primary) hypertension: Secondary | ICD-10-CM

## 2021-08-16 NOTE — Telephone Encounter (Signed)
Spoke with pt and informed that prescription is at pharmacy. Pt now have insurance

## 2021-09-23 ENCOUNTER — Other Ambulatory Visit: Payer: Self-pay | Admitting: Family Medicine

## 2021-09-23 DIAGNOSIS — J329 Chronic sinusitis, unspecified: Secondary | ICD-10-CM

## 2021-09-23 NOTE — Telephone Encounter (Signed)
Requested medication (s) are due for refill today: Yes  Requested medication (s) are on the active medication list: Yes  Last refill:  11//3/21  Future visit scheduled: No  Notes to clinic:  Prescription expired.    Requested Prescriptions  Pending Prescriptions Disp Refills   azelastine (ASTELIN) 0.1 % nasal spray [Pharmacy Med Name: Azelastine HCl 0.1 % Nasal Solution] 30 mL 0    Sig: USE 2 SPRAY(S) IN EACH NOSTRIL TWICE DAILY AS DIRECTED     Ear, Nose, and Throat: Nasal Preparations - Antiallergy Passed - 09/23/2021  7:11 AM      Passed - Valid encounter within last 12 months    Recent Outpatient Visits           5 months ago Sciatica of left side   [facility] [place], Elnita Maxwell, NP   9 months ago Rash and nonspecific skin eruption   [facility] Danelle Berry, PA-C   1 year ago Vertigo   [facility] Danelle Berry, PA-C   1 year ago Vertigo   [facility] Danelle Berry, PA-C   1 year ago Essential hypertension, benign   [facility] Danelle Berry, [place]

## 2021-10-28 ENCOUNTER — Other Ambulatory Visit: Payer: Self-pay | Admitting: Family Medicine

## 2021-10-28 DIAGNOSIS — I10 Essential (primary) hypertension: Secondary | ICD-10-CM

## 2021-10-28 NOTE — Telephone Encounter (Signed)
Requested Prescriptions  Pending Prescriptions Disp Refills   amLODipine (NORVASC) 2.5 MG tablet [Pharmacy Med Name: amLODIPine Besylate 2.5 MG Oral Tablet] 30 tablet 0    Sig: Take 1 tablet by mouth once daily     Cardiovascular:  Calcium Channel Blockers Failed - 10/28/2021 11:23 AM      Failed - Valid encounter within last 6 months    Recent Outpatient Visits          6 months ago Sciatica of left side   [facility] [place], Elnita Maxwell, NP   11 months ago Rash and nonspecific skin eruption   [facility] Danelle Berry, PA-C   1 year ago Vertigo   [facility] Danelle Berry, PA-C   1 year ago Vertigo   [facility] Danelle Berry, PA-C   1 year ago Essential hypertension, benign   [facility] Danelle Berry, PA-C      Future Appointments            In 6 days Berniece Salines, FNP [facility]           Passed - Last BP in normal range    BP Readings from Last 1 Encounters:  04/12/21 130/84

## 2021-11-03 ENCOUNTER — Other Ambulatory Visit: Payer: Self-pay

## 2021-11-03 ENCOUNTER — Telehealth: Payer: Self-pay | Admitting: Family Medicine

## 2021-11-03 ENCOUNTER — Encounter: Payer: Self-pay | Admitting: Nurse Practitioner

## 2021-11-03 ENCOUNTER — Ambulatory Visit (INDEPENDENT_AMBULATORY_CARE_PROVIDER_SITE_OTHER): Payer: 59 | Admitting: Nurse Practitioner

## 2021-11-03 VITALS — BP 124/76 | HR 91 | Temp 98.1°F | Resp 16 | Ht 62.0 in | Wt 214.2 lb

## 2021-11-03 DIAGNOSIS — J310 Chronic rhinitis: Secondary | ICD-10-CM | POA: Diagnosis not present

## 2021-11-03 DIAGNOSIS — I10 Essential (primary) hypertension: Secondary | ICD-10-CM | POA: Diagnosis not present

## 2021-11-03 DIAGNOSIS — Z131 Encounter for screening for diabetes mellitus: Secondary | ICD-10-CM

## 2021-11-03 DIAGNOSIS — Z13220 Encounter for screening for lipoid disorders: Secondary | ICD-10-CM

## 2021-11-03 DIAGNOSIS — J329 Chronic sinusitis, unspecified: Secondary | ICD-10-CM

## 2021-11-03 DIAGNOSIS — E785 Hyperlipidemia, unspecified: Secondary | ICD-10-CM

## 2021-11-03 DIAGNOSIS — R600 Localized edema: Secondary | ICD-10-CM | POA: Diagnosis not present

## 2021-11-03 MED ORDER — LORATADINE 10 MG PO TABS: 10.0000 mg | ORAL_TABLET | Freq: Every day | ORAL | 3 refills | Status: DC

## 2021-11-03 MED ORDER — OLMESARTAN MEDOXOMIL 20 MG PO TABS: 10.0000 mg | ORAL_TABLET | Freq: Every day | ORAL | 0 refills | Status: DC

## 2021-11-03 MED ORDER — AZELASTINE HCL 0.1 % NA SOLN: NASAL | 1 refills | Status: DC

## 2021-11-03 MED ORDER — AMLODIPINE BESYLATE 2.5 MG PO TABS: 2.5000 mg | ORAL_TABLET | Freq: Every day | ORAL | 1 refills | Status: DC

## 2021-11-03 MED ORDER — TRIAMTERENE-HCTZ 37.5-25 MG PO TABS: 1.0000 | ORAL_TABLET | Freq: Every day | ORAL | 1 refills | Status: DC

## 2021-11-03 NOTE — Telephone Encounter (Signed)
Pt is confused about her medication instructions and does not know what she should be taking  Best contact: [phone]

## 2021-11-03 NOTE — Progress Notes (Deleted)
BP 124/76    Pulse 91    Temp 98.1 F (36.7 C)    Resp 16    Ht 5\' 2"  (1.575 m)    Wt 214 lb 3.2 oz (97.2 kg)    SpO2 99%    BMI 39.18 kg/m    Subjective:    Patient ID: , female    DOB: [birth year], 65 y.o.   MRN: [id number]  HPI: Vanessa Gross is a 65 y.o. female, here alone  Chief Complaint  Patient presents with   Follow-up   Hypertension   Hyperlipidemia    Never started crestor   HTN: She says her blood pressure has been running around 120-130/70.  She says she does not check her blood pressure regularly.  Her blood pressure today is 124/76.  She denies any chest pain, shortness of breath, headache or blurred vision.    Hyperlipidemia:  Her last LDL was 193, on 07/24/20.  She was prescribed amlodipine.  She says she never took it.  Discussed that reasons for cholesterol medication.  She said she is not going to take it.   The 10-year ASCVD risk score (Arnett DK, et al., 2019) is: 11.6%   Values used to calculate the score:     Age: 2 years     Sex: Female     Is Non-Hispanic African American: Yes     Diabetic: No     Tobacco smoker: No     Systolic Blood Pressure: 124 mmHg     Is BP treated: Yes     HDL Cholesterol: 41 mg/dL     Total Cholesterol: 275 mg/dL   Lower extremity edema: She has some minor swelling in her lower extremities.  She says it has been like that for awhile but she would it to go away.  Discussed stopping her amlodipine.  She would like to do that.  Discussed continuing to monitor her blood pressure and will reevaluate. Discussed using compression socks and elevating legs.  Sinusitis:  She says she takes Claritin and azelastine nasal spray daily. She says she can tell when she does not take it.  Will refill prescription.  Relevant past medical, surgical, family and social history reviewed and updated as indicated. Interim medical history since our last visit reviewed. Allergies and medications reviewed and updated.  Review of  Systems  Constitutional: Negative for fever or weight change.  Respiratory: Negative for cough and shortness of breath.   Cardiovascular: Negative for chest pain or palpitations.  Gastrointestinal: Negative for abdominal pain, no bowel changes.  Musculoskeletal: Negative for gait problem or joint swelling.  Skin: Negative for rash.  Neurological: Negative for dizziness or headache.  No other specific complaints in a complete review of systems (except as listed in HPI above).      Objective:    BP 124/76    Pulse 91    Temp 98.1 F (36.7 C)    Resp 16    Ht 5\' 2"  (1.575 m)    Wt 214 lb 3.2 oz (97.2 kg)    SpO2 99%    BMI 39.18 kg/m   Wt Readings from Last 3 Encounters:  11/03/21 214 lb 3.2 oz (97.2 kg)  04/12/21 201 lb 8 oz (91.4 kg)  09/09/20 198 lb 11.2 oz (90.1 kg)    Physical Exam  Constitutional: Patient appears well-developed and well-nourished. Obese No distress.  HEENT: head atraumatic, normocephalic, pupils equal and reactive to light, neck supple Cardiovascular: Normal rate, regular  rhythm and normal heart sounds.  No murmur heard. Slight lower extremity non-pitting edema noted Pulmonary/Chest: Effort normal and breath sounds normal. No respiratory distress. Abdominal: Soft.  There is no tenderness. Psychiatric: Patient has a normal mood and affect. behavior is normal. Judgment and thought content normal.   Results for orders placed or performed in visit on 04/12/21  POCT urinalysis dipstick  Result Value Ref [facility]   Color, UA yellow    Clarity, UA clear    Glucose, UA Negative Negative   Bilirubin, UA neg    Ketones, UA neg    Spec Grav, UA 1.015 1.010 - 1.025   Blood, UA neg    pH, UA 6.5 5.0 - 8.0   Protein, UA Negative Negative   Urobilinogen, UA 0.2 0.2 or 1.0 E.U./dL   Nitrite, UA neg    Leukocytes, UA Negative Negative   Appearance clear    Odor normal       Assessment & Plan:   1. Essential hypertension, benign  - triamterene-hydrochlorothiazide  (MAXZIDE-25) 37.5-25 MG tablet; Take 1 tablet by mouth daily.  Dispense: 90 tablet; Refill: 1 - CBC with Differential/Platelet - COMPLETE METABOLIC PANEL WITH GFR  2. Hyperlipidemia, unspecified hyperlipidemia type  -lipid panel  3. Lower extremity edema -stop amlodipine -use compression socks -elevated legs  4. Rhinosinusitis  - azelastine (ASTELIN) 0.1 % nasal spray; USE 2 SPRAY(S) IN EACH NOSTRIL TWICE DAILY AS DIRECTED  Dispense: 30 mL; Refill: 1 - loratadine (CLARITIN) 10 MG tablet; Take 1 tablet (10 mg total) by mouth daily.  Dispense: 90 tablet; Refill: 3  5. Screening for cholesterol level  - Lipid panel  6. Screening for diabetes mellitus  - Hemoglobin A1c   Follow up plan: Return in two weeks for blood pressure check, Return in about 6 months (around 05/04/2022) for follow up.

## 2021-11-03 NOTE — Telephone Encounter (Signed)
Dr.Sowles sent in rx for Olmesartan because she did not want to adjust the dose. I have confirmed with [facility] and spoke to [place] to make sure Amlodipine was canceled for patient. Dr.Sowles was able to speak to pt and clarify what pt needed.

## 2021-11-04 LAB — COMPLETE METABOLIC PANEL WITH GFR
AG Ratio: 1.9 (calc) (ref 1.0–2.5)
ALT: 13 U/L (ref 6–29)
AST: 15 U/L (ref 10–35)
Albumin: 4.3 g/dL (ref 3.6–5.1)
Alkaline phosphatase (APISO): 51 U/L (ref 37–153)
BUN: 10 mg/dL (ref 7–25)
CO2: 30 mmol/L (ref 20–32)
Calcium: 9.3 mg/dL (ref 8.6–10.4)
Chloride: 103 mmol/L (ref 98–110)
Creat: 0.74 mg/dL (ref 0.50–1.05)
Globulin: 2.3 g/dL (calc) (ref 1.9–3.7)
Glucose, Bld: 98 mg/dL (ref 65–99)
Potassium: 4.4 mmol/L (ref 3.5–5.3)
Sodium: 141 mmol/L (ref 135–146)
Total Bilirubin: 0.3 mg/dL (ref 0.2–1.2)
Total Protein: 6.6 g/dL (ref 6.1–8.1)
eGFR: 90 mL/min/{1.73_m2} (ref >=60)

## 2021-11-04 LAB — LIPID PANEL
Cholesterol: 255 mg/dL — ABNORMAL HIGH (ref <200)
HDL: 39 mg/dL — ABNORMAL LOW (ref >=50)
Non-HDL Cholesterol (Calc): 216 mg/dL (calc) — ABNORMAL HIGH (ref <130)
Total CHOL/HDL Ratio: 6.5 (calc) — ABNORMAL HIGH (ref <5.0)
Triglycerides: 503 mg/dL — ABNORMAL HIGH (ref <150)

## 2021-11-04 LAB — CBC WITH DIFFERENTIAL/PLATELET
Absolute Monocytes: 548 cells/uL (ref 200–950)
Basophils Absolute: 73 cells/uL (ref 0–200)
Basophils Relative: 1.1 %
Eosinophils Absolute: 132 cells/uL (ref 15–500)
Eosinophils Relative: 2 %
HCT: 43.4 % (ref 35.0–45.0)
Hemoglobin: 13.6 g/dL (ref 11.7–15.5)
Lymphs Abs: 2488 cells/uL (ref 850–3900)
MCH: 21.3 pg — ABNORMAL LOW (ref 27.0–33.0)
MCHC: 31.3 g/dL — ABNORMAL LOW (ref 32.0–36.0)
MCV: 68 fL — ABNORMAL LOW (ref 80.0–100.0)
MPV: 11.5 fL (ref 7.5–12.5)
Monocytes Relative: 8.3 %
Neutro Abs: 3359 cells/uL (ref 1500–7800)
Neutrophils Relative %: 50.9 %
Platelets: 309 10*3/uL (ref 140–400)
RBC: 6.38 10*6/uL — ABNORMAL HIGH (ref 3.80–5.10)
RDW: 17.5 % — ABNORMAL HIGH (ref 11.0–15.0)
Total Lymphocyte: 37.7 %
WBC: 6.6 10*3/uL (ref 3.8–10.8)

## 2021-11-04 LAB — HEMOGLOBIN A1C
Hgb A1c MFr Bld: 5.9 % of total Hgb — ABNORMAL HIGH (ref <5.7)
Mean Plasma Glucose: 123 mg/dL
eAG (mmol/L): 6.8 mmol/L

## 2021-11-04 NOTE — Progress Notes (Addendum)
BP 124/76    Pulse 91    Temp 98.1 F (36.7 C)    Resp 16    Ht 5' 2" (1.575 m)    Wt 214 lb 3.2 oz (97.2 kg)    SpO2 99%    BMI 39.18 kg/m    Subjective:    Patient ID: Vanessa Gross, female    DOB: [birth year], 65 y.o.   MRN: [id number]  HPI: Vanessa Gross is a 65 y.o. female, here alone  Chief Complaint  Patient presents with   Follow-up   Hypertension   Hyperlipidemia    Never started crestor   HTN: She says her blood pressure has been running around 120-130/70.  She says she does not check her blood pressure regularly.  Her blood pressure today is 124/76.  She denies any chest pain, shortness of breath, headache or blurred vision.    Hyperlipidemia:  Her last LDL was 193, on 07/24/20.  She was prescribed rosuvastatin.  She says she never took it.  Discussed that reasons for cholesterol medication.  She said she is not going to take it.   The 10-year ASCVD risk score (Arnett DK, et al., 2019) is: 11%   Values used to calculate the score:     Age: 71 years     Sex: Female     Is Non-Hispanic African American: Yes     Diabetic: No     Tobacco smoker: No     Systolic Blood Pressure: 237 mmHg     Is BP treated: Yes     HDL Cholesterol: 39 mg/dL     Total Cholesterol: 255 mg/dL   Lower extremity edema: She has some minor swelling in her lower extremities.  She says it has been like that for awhile but she would it to go away.  Discussed stopping her amlodipine.  She would like to do that.  Discussed continuing to monitor her blood pressure and will reevaluate. Discussed using compression socks and elevating legs.  Sinusitis:  She says she takes Claritin and azelastine nasal spray daily. She says she can tell when she does not take it.  Will refill prescription.  Relevant past medical, surgical, family and social history reviewed and updated as indicated. Interim medical history since our last visit reviewed. Allergies and medications reviewed and updated.  Review of  Systems  Constitutional: Negative for fever or weight change.  Respiratory: Negative for cough and shortness of breath.   Cardiovascular: Negative for chest pain or palpitations.  Gastrointestinal: Negative for abdominal pain, no bowel changes.  Musculoskeletal: Negative for gait problem or joint swelling.  Skin: Negative for rash.  Neurological: Negative for dizziness or headache.  No other specific complaints in a complete review of systems (except as listed in HPI above).      Objective:    BP 124/76    Pulse 91    Temp 98.1 F (36.7 C)    Resp 16    Ht 5' 2" (1.575 m)    Wt 214 lb 3.2 oz (97.2 kg)    SpO2 99%    BMI 39.18 kg/m   Wt Readings from Last 3 Encounters:  11/03/21 214 lb 3.2 oz (97.2 kg)  04/12/21 201 lb 8 oz (91.4 kg)  09/09/20 198 lb 11.2 oz (90.1 kg)    Physical Exam  Constitutional: Patient appears well-developed and well-nourished. Obese No distress.  HEENT: head atraumatic, normocephalic, pupils equal and reactive to light, neck supple Cardiovascular: Normal rate,  regular rhythm and normal heart sounds.  No murmur heard. Slight lower extremity non-pitting edema noted Pulmonary/Chest: Effort normal and breath sounds normal. No respiratory distress. Abdominal: Soft.  There is no tenderness. Psychiatric: Patient has a normal mood and affect. behavior is normal. Judgment and thought content normal.   Results for orders placed or performed in visit on 11/03/21  Lipid panel  Result Value Ref [facility]   Cholesterol 255 (H) <200 mg/dL   HDL 39 (L) > OR = 50 mg/dL   Triglycerides 503 (H) <150 mg/dL   LDL Cholesterol (Calc)  mg/dL (calc)   Total CHOL/HDL Ratio 6.5 (H) <5.0 (calc)   Non-HDL Cholesterol (Calc) 216 (H) <130 mg/dL (calc)  CBC with Differential/Platelet  Result Value Ref Range   WBC 6.6 3.8 - 10.8 Thousand/uL   RBC 6.38 (H) 3.80 - 5.10 Million/uL   Hemoglobin 13.6 11.7 - 15.5 g/dL   HCT 43.4 35.0 - 45.0 %   MCV 68.0 (L) 80.0 - 100.0 fL   MCH 21.3 (L)  27.0 - 33.0 pg   MCHC 31.3 (L) 32.0 - 36.0 g/dL   RDW 17.5 (H) 11.0 - 15.0 %   Platelets 309 140 - 400 Thousand/uL   MPV 11.5 7.5 - 12.5 fL   Neutro Abs 3,359 1,500 - 7,800 cells/uL   Lymphs Abs 2,488 850 - 3,900 cells/uL   Absolute Monocytes 548 200 - 950 cells/uL   Eosinophils Absolute 132 15 - 500 cells/uL   Basophils Absolute 73 0 - 200 cells/uL   Neutrophils Relative % 50.9 %   Total Lymphocyte 37.7 %   Monocytes Relative 8.3 %   Eosinophils Relative 2.0 %   Basophils Relative 1.1 %   Smear Review    COMPLETE METABOLIC PANEL WITH GFR  Result Value Ref Range   Glucose, Bld 98 65 - 99 mg/dL   BUN 10 7 - 25 mg/dL   Creat 0.74 0.50 - 1.05 mg/dL   eGFR 90 > OR = 60 mL/min/1.93m   BUN/Creatinine Ratio NOT APPLICABLE 6 - 22 (calc)   Sodium 141 135 - 146 mmol/L   Potassium 4.4 3.5 - 5.3 mmol/L   Chloride 103 98 - 110 mmol/L   CO2 30 20 - 32 mmol/L   Calcium 9.3 8.6 - 10.4 mg/dL   Total Protein 6.6 6.1 - 8.1 g/dL   Albumin 4.3 3.6 - 5.1 g/dL   Globulin 2.3 1.9 - 3.7 g/dL (calc)   AG Ratio 1.9 1.0 - 2.5 (calc)   Total Bilirubin 0.3 0.2 - 1.2 mg/dL   Alkaline phosphatase (APISO) 51 37 - 153 U/L   AST 15 10 - 35 U/L   ALT 13 6 - 29 U/L  Hemoglobin A1c  Result Value Ref Range   Hgb A1c MFr Bld 5.9 (H) <5.7 % of total Hgb   Mean Plasma Glucose 123 mg/dL   eAG (mmol/L) 6.8 mmol/L      Assessment & Plan:   1. Essential hypertension, benign  - triamterene-hydrochlorothiazide (MAXZIDE-25) 37.5-25 MG tablet; Take 1 tablet by mouth daily.  Dispense: 90 tablet; Refill: 1 - CBC with Differential/Platelet - COMPLETE METABOLIC PANEL WITH GFR  2. Hyperlipidemia, unspecified hyperlipidemia type  -lipid panel  3. Lower extremity edema -stop amlodipine -use compression socks -elevated legs  4. Rhinosinusitis  - azelastine (ASTELIN) 0.1 % nasal spray; USE 2 SPRAY(S) IN EACH NOSTRIL TWICE DAILY AS DIRECTED  Dispense: 30 mL; Refill: 1 - loratadine (CLARITIN) 10 MG tablet; Take 1  tablet (10 mg total)  by mouth daily.  Dispense: 90 tablet; Refill: 3  5. Screening for cholesterol level  - Lipid panel  6. Screening for diabetes mellitus  - Hemoglobin A1c   Follow up plan: Return in two weeks for blood pressure check, Return in about 6 months (around 05/04/2022) for follow up.

## 2021-11-17 ENCOUNTER — Ambulatory Visit: Payer: Self-pay | Admitting: *Deleted

## 2021-11-17 DIAGNOSIS — I10 Essential (primary) hypertension: Secondary | ICD-10-CM

## 2021-11-17 DIAGNOSIS — M5432 Sciatica, left side: Secondary | ICD-10-CM

## 2021-11-18 NOTE — Chronic Care Management (AMB) (Deleted)
° °  11/18/2021  [place] 1955/12/10 [id number]  Phone call from patient stating that she received a bill from her insurance company and she did not understand the charges. Per patient, she has made efforts to reach out to her insurance agent and has received no return call.  Phone call made to insurance agent(Magdalyn [phone]) who is agreeable to reaching out to patient on 11/19/21 to provide any available assistance.   Elliot Gurney, [place] Administrator[facility] [phone]

## 2021-11-18 NOTE — Chronic Care Management (AMB) (Signed)
Chronic Care Management    Clinical Social Work Note  11/18/2021 Name: Vanessa Gross MRN: [id number] DOB: [birth year]  Vanessa Gross is a 66 y.o. year old female who is a primary care patient of Delsa Grana, [place]. The CCM team was consulted to assist the patient with chronic disease management and/or care coordination needs related to: [employer] .   Engaged with patient by telephone for  acute phone call regarding insurance coverage and unexplained charges  in response to provider referral for social work chronic care management and care coordination services.   Consent to Services:  The patient was given information about Chronic Care Management services, agreed to services, and gave verbal consent prior to initiation of services.  Please see initial visit note for detailed documentation.   Patient agreed to services and consent obtained.   Assessment: Review of patient past medical history, allergies, medications, and health status, including review of relevant consultants reports was performed today as part of a comprehensive evaluation and provision of chronic care management and care coordination services.     SDOH (Social Determinants of Health) assessments and interventions performed:    Advanced Directives Status: Not addressed in this encounter.  CCM Care Plan  Allergies  Allergen Reactions   Celecoxib    Gabapentin     abnormal dreams    Outpatient Encounter Medications as of 11/17/2021  Medication Sig Note   aspirin EC 81 MG tablet Take 1 tablet (81 mg total) by mouth daily.    azelastine (ASTELIN) 0.1 % nasal spray USE 2 SPRAY(S) IN EACH NOSTRIL TWICE DAILY AS DIRECTED    Cholecalciferol 25 MCG (1000 UT) tablet Take 1,000 Units by mouth daily.    loratadine (CLARITIN) 10 MG tablet Take 1 tablet (10 mg total) by mouth daily.    Multiple Vitamin (MULTI-VITAMINS) TABS Take by mouth. 10/19/2015: Received from: [place]   olmesartan  (BENICAR) 20 MG tablet Take 0.5 tablets (10 mg total) by mouth daily.    triamterene-hydrochlorothiazide (MAXZIDE-25) 37.5-25 MG tablet Take 1 tablet by mouth daily.    No facility-administered encounter medications on file as of 11/17/2021.    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 05/25/2007    Conditions to be addressed/monitored:  sciatica; Financial constraints related to lack of insurance coverage   Care Plan : General Social Work (Adult)  Updates made by [employer], Darla Lesches, LCSW since 11/18/2021 12:00 AM     Problem: CHL AMB "PATIENT-SPECIFIC PROBLEM"   Priority: Medium  Onset Date: 04/14/2021  Note:   Current Barriers:  Financial constraints related to lack of medical insurance Suicidal Ideation/Homicidal Ideation: No  Clinical Social Work Goal(s):  patient will work with SW to address concerns related to her insurance coverage  Interventions:  Patient continues to discuss issues related to chronic pain and recent release from her previous insurance provider  [employer] identified as a plan that patient may be eligible for and was contacted to confirm that they are in network.  Patient agreed to the plan and has now worked out the bill that she received with the help from her broker-now she is now just responsible for the monthly payment Assisted patient with understanding health plan benefits however continued to  recommend that patient's broker provide patient with specific details regarding insurance coverage and monthly payments Patient verbalize no additional community resource needs at this time Active listening / Reflection utilized  Emotional Support Provided related to her medical  and insurance coverage challenges. 11/18/21  Phone call from patient stating that she received a bill from her insurance company and she did not understand the charges. Patient states that she should be getting Medicare in March. Per patient, she has made efforts to reach out to her insurance agent and has received no return call.  Phone call made to insurance agent(Magdalyn [phone]) who is agreeable to reaching out to patient on 11/19/21 to provide any available assistance.    Patient Self Care Activities:  Performs ADL's independently Performs IADL's independently Strong family or social support  Patient Coping Strengths:  Self Advocate Able to Communicate Effectively  Patient Self Care Deficits:  Insurance coverage challenges         Follow Up Plan: Client will contact this social worker with any additional community resource needs      [place], [place] Worker  [facility] [phone]

## 2021-11-18 NOTE — Patient Instructions (Signed)
Visit Information  Thank you for taking time to visit with me today. Please don't hesitate to contact me if I can be of assistance to you before our next scheduled telephone appointment.  Following are the goals we discussed today:   - follow-up on any referrals for help I am given - think ahead to make sure my need does not become an emergency - have a back-up plan  -continue to follow up with insurance agent regarding insurance coverage and options    If you are experiencing a Mental Health or Behavioral Health Crisis or need someone to talk to, please call the Suicide and Crisis Lifeline: 988   The patient verbalized understanding of instructions, educational materials, and care plan provided today and declined offer to receive copy of patient instructions, educational materials, and care plan.   No further follow up required: patient to follow up with her insurance agent regarding charges received  Verna Czech, LCSW Clinical Social Worker  [facility] [phone]

## 2021-12-22 ENCOUNTER — Ambulatory Visit: Payer: Self-pay | Admitting: *Deleted

## 2021-12-22 NOTE — Telephone Encounter (Signed)
Summary: medication issue and advice   Pt was prescribed olmesartan (BENICAR) 20 MG tablet / she was suppose to take half a tab but has been taking a whole tab and is now running out of medication / she stated that the whole tab worked better / pts feet are not as swollen / pt asked asked to speak to Dr. Carlynn Purl or nurse Myriam Jacobson about this      Reason for Disposition  [1] Caller has URGENT medicine question about med that PCP or specialist prescribed AND [2] triager unable to answer question  Answer Assessment - Initial Assessment Questions 1. NAME of MEDICATION: "What medicine are you calling about?"     olmesartan 2. QUESTION: "What is your question?" (e.g., double dose of medicine, side effect)     Patient states she accidentally took whole pill at first- and noticed a difference in her swelling- she started with the half dose when she noticed she was taking it wrong and her swelling got worse. She started taking whole dose and she states her swelling in her left foot has decreased and she is able to move 2 of her toes. Patient wants to know if she can continue to take 20 mg dose. BP 138/74 P 83 3. PRESCRIBING HCP: "Who prescribed it?" Reason: if prescribed by specialist, call should be referred to that group.     [place] 4. SYMPTOMS: "Do you have any symptoms?"     Patient wants something to help her swelling go down and to get fluid out. Patient is still not happy with swelling in left foot- wants it to be like R foot. Patient does need new Rx- she has been using double dose. Please advise  Protocols used: Medication Question Call-A-AH

## 2021-12-23 ENCOUNTER — Other Ambulatory Visit: Payer: Self-pay | Admitting: Nurse Practitioner

## 2021-12-23 DIAGNOSIS — I10 Essential (primary) hypertension: Secondary | ICD-10-CM

## 2021-12-23 MED ORDER — OLMESARTAN MEDOXOMIL 20 MG PO TABS: 20.0000 mg | ORAL_TABLET | Freq: Every day | ORAL | 1 refills | Status: DC

## 2021-12-23 NOTE — Telephone Encounter (Signed)
Should have been taking 10 mg tablet. This was not doing anything for her. When she was taking it wrong the entire 20mg  of Olmesartan qd this helped her more with the fluid and her blood pressure check was good. Please advise. Since she was taking an entire tablet she is out of medication. Have fluid in left leg. Medication do not take it out completely but it helps.

## 2021-12-28 ENCOUNTER — Other Ambulatory Visit: Payer: Self-pay | Admitting: Emergency Medicine

## 2021-12-28 DIAGNOSIS — J329 Chronic sinusitis, unspecified: Secondary | ICD-10-CM

## 2021-12-28 MED ORDER — LORATADINE 10 MG PO TABS: 10.0000 mg | ORAL_TABLET | Freq: Every day | ORAL | 3 refills | Status: DC

## 2022-03-28 ENCOUNTER — Other Ambulatory Visit: Payer: Self-pay | Admitting: Nurse Practitioner

## 2022-03-28 DIAGNOSIS — I10 Essential (primary) hypertension: Secondary | ICD-10-CM

## 2022-03-29 NOTE — Telephone Encounter (Signed)
Requested Prescriptions  Pending Prescriptions Disp Refills  . triamterene-hydrochlorothiazide (MAXZIDE-25) 37.5-25 MG tablet [Pharmacy Med Name: Triamterene-HCTZ 37.5-25 MG Oral Tablet] 90 tablet 0    Sig: Take 1 tablet by mouth once daily     Cardiovascular: Diuretic Combos Passed - 03/28/2022 11:21 AM      Passed - K in normal range and within 180 days    Potassium  Date Value Ref Range Status  11/03/2021 4.4 3.5 - 5.3 mmol/L Final         Passed - Na in normal range and within 180 days    Sodium  Date Value Ref Range Status  11/03/2021 141 135 - 146 mmol/L Final         Passed - Cr in normal range and within 180 days    Creat  Date Value Ref Range Status  11/03/2021 0.74 0.50 - 1.05 mg/dL Final         Passed - Last BP in normal range    BP Readings from Last 1 Encounters:  11/03/21 124/76         Passed - Valid encounter within last 6 months    Recent Outpatient Visits          4 months ago Essential hypertension, benign   [facility] Berniece Salines, FNP   11 months ago Sciatica of left side   [facility] Lydia, Elnita Maxwell, NP   1 year ago Rash and nonspecific skin eruption   [facility] Danelle Berry, PA-C   1 year ago Vertigo   [facility] Danelle Berry, PA-C   1 year ago Vertigo   [facility] Danelle Berry, PA-C      Future Appointments            In 1 month Zane Herald, Rudolpho Sevin, FNP [place]

## 2022-04-05 ENCOUNTER — Other Ambulatory Visit: Payer: Self-pay | Admitting: Nurse Practitioner

## 2022-04-05 DIAGNOSIS — J329 Chronic sinusitis, unspecified: Secondary | ICD-10-CM

## 2022-04-06 NOTE — Telephone Encounter (Signed)
Requested Prescriptions  Pending Prescriptions Disp Refills  . Azelastine HCl 137 MCG/SPRAY SOLN [Pharmacy Med Name: Azelastine HCl 137 MCG/SPRAY Nasal Solution] 30 mL 0    Sig: USE 2 SPRAY(S) IN EACH NOSTRIL TWICE DAILY AS DIRECTED     Ear, Nose, and Throat: Nasal Preparations - Antiallergy Passed - 04/05/2022  9:09 AM      Passed - Valid encounter within last 12 months    Recent Outpatient Visits          5 months ago Essential hypertension, benign   [facility] Berniece Salines, FNP   11 months ago Sciatica of left side   [facility] Cirri, NP   1 year ago Rash and nonspecific skin eruption   [facility] Danelle Berry, PA-C   1 year ago Vertigo   [facility] Danelle Berry, PA-C   1 year ago Vertigo   [facility] Danelle Berry, PA-C      Future Appointments            In 4 weeks Zane Herald, Rudolpho Sevin, FNP [facility], [facility]

## 2022-05-04 ENCOUNTER — Ambulatory Visit (INDEPENDENT_AMBULATORY_CARE_PROVIDER_SITE_OTHER): Payer: Medicare Other | Admitting: Nurse Practitioner

## 2022-05-04 ENCOUNTER — Encounter: Payer: Self-pay | Admitting: Nurse Practitioner

## 2022-05-04 ENCOUNTER — Other Ambulatory Visit: Payer: Self-pay

## 2022-05-04 VITALS — BP 128/78 | HR 84 | Temp 98.1°F | Resp 16 | Ht 62.0 in | Wt 211.8 lb

## 2022-05-04 DIAGNOSIS — I10 Essential (primary) hypertension: Secondary | ICD-10-CM | POA: Diagnosis not present

## 2022-05-04 DIAGNOSIS — Z1231 Encounter for screening mammogram for malignant neoplasm of breast: Secondary | ICD-10-CM

## 2022-05-04 DIAGNOSIS — Z1211 Encounter for screening for malignant neoplasm of colon: Secondary | ICD-10-CM

## 2022-05-04 DIAGNOSIS — W19XXXA Unspecified fall, initial encounter: Secondary | ICD-10-CM

## 2022-05-04 DIAGNOSIS — E785 Hyperlipidemia, unspecified: Secondary | ICD-10-CM | POA: Diagnosis not present

## 2022-05-04 DIAGNOSIS — J301 Allergic rhinitis due to pollen: Secondary | ICD-10-CM

## 2022-05-04 DIAGNOSIS — M62838 Other muscle spasm: Secondary | ICD-10-CM

## 2022-05-04 MED ORDER — OLMESARTAN MEDOXOMIL 20 MG PO TABS: 20.0000 mg | ORAL_TABLET | Freq: Every day | ORAL | 3 refills | Status: DC

## 2022-05-04 MED ORDER — CYCLOBENZAPRINE HCL 5 MG PO TABS: 5.0000 mg | ORAL_TABLET | Freq: Three times a day (TID) | ORAL | 0 refills | Status: DC | PRN

## 2022-05-04 MED ORDER — ATORVASTATIN CALCIUM 10 MG PO TABS: 10.0000 mg | ORAL_TABLET | Freq: Every day | ORAL | 0 refills | Status: DC

## 2022-05-04 MED ORDER — AZELASTINE HCL 137 MCG/SPRAY NA SOLN: NASAL | 3 refills | Status: DC

## 2022-05-04 MED ORDER — TRIAMTERENE-HCTZ 37.5-25 MG PO TABS: 1.0000 | ORAL_TABLET | Freq: Every day | ORAL | 3 refills | Status: DC

## 2022-05-04 MED ORDER — LORATADINE 10 MG PO TABS: 10.0000 mg | ORAL_TABLET | Freq: Every day | ORAL | 3 refills | Status: DC

## 2022-05-04 NOTE — Progress Notes (Signed)
BP 128/78   Pulse 84   Temp 98.1 F (36.7 C) (Oral)   Resp 16   Ht '5\' 2"'  (1.575 m)   Wt 211 lb 12.8 oz (96.1 kg)   SpO2 98%   BMI 38.74 kg/m    Subjective:    Patient ID: Vanessa Gross, female    DOB: 1956-06-18, 66 y.o.   MRN: [id number]  HPI: Vanessa Gross is a 66 y.o. female  Chief Complaint  Patient presents with   Hypertension    6 month recheck   Fall    This morning in shower, pain all way down right side of body   Allergic Rhinitis    HTN: Patient is currently taking olmesartan 25 mg daily and triamterene-hydrochlorothiazide 37.5-25 mg daily. Her blood pressure today is 128/78. She denies any headaches, blurred vision, chest pain or shortness of breath. We recently stopped her amlodipine due to edema. Patient reports she has been doing well.   Hyperlipidemia: Her last LDL was unreadable due to her triglycerides being so high they were 503 on 11/03/2021.  Recommended that she start cholesterol lower medication at that time and she said she would think about it.  Patient has decided to start atorvastatin. Discussed side effects of medication.  The 10-year ASCVD risk score (Arnett DK, et al., 2019) is: 12.3%   Values used to calculate the score:     Age: 63 years     Sex: Female     Is Non-Hispanic African American: Yes     Diabetic: No     Tobacco smoker: No     Systolic Blood Pressure: 621 mmHg     Is BP treated: Yes     HDL Cholesterol: 39 mg/dL     Total Cholesterol: 255 mg/dL   Allergic rhinitis: Patient reports that she takes Claritin and azelastine nasal spray for her allergies. She says this works well for her and will send in refills.   Fall/right neck pain:  Patient reports she slipped and fell in the shower this morning.  Patient states she is having right-sided neck pain right axilla pain and bilateral hip soreness.  Patient reports she is able to move everything she just says she feels tender and feels tightness like her muscles are contracted.   Patient states she does not feel like anything needs to be x-rayed.  Patient states she just wants a little mild muscle relaxer to help her through this.  Discussed that she can take Tylenol and ibuprofen for pain.  She can also use ice packs in all the areas that affected.  We will send in mild muscle relaxer.  Patient is neurologically intact.  No cervical tenderness.  Relevant past medical, surgical, family and social history reviewed and updated as indicated. Interim medical history since our last visit reviewed. Allergies and medications reviewed and updated.  Review of Systems  Constitutional: Negative for fever or weight change.  Respiratory: Negative for cough and shortness of breath.   Cardiovascular: Negative for chest pain or palpitations.  Gastrointestinal: Negative for abdominal pain, no bowel changes.  Musculoskeletal: Negative for gait problem or joint swelling.  Positive for right-sided neck pain right axilla pain and bilateral hip pain Skin: Negative for rash.  Neurological: Negative for dizziness or headache.  No other specific complaints in a complete review of systems (except as listed in HPI above).      Objective:    BP 128/78   Pulse 84   Temp 98.1 F (36.7  C) (Oral)   Resp 16   Ht '5\' 2"'  (1.575 m)   Wt 211 lb 12.8 oz (96.1 kg)   SpO2 98%   BMI 38.74 kg/m   Wt Readings from Last 3 Encounters:  05/04/22 211 lb 12.8 oz (96.1 kg)  11/03/21 214 lb 3.2 oz (97.2 kg)  04/12/21 201 lb 8 oz (91.4 kg)    Physical Exam  Constitutional: Patient appears well-developed and well-nourished. Obese  No distress.  HEENT: head atraumatic, normocephalic, pupils equal and reactive to light, neck supple Cardiovascular: Normal rate, regular rhythm and normal heart sounds.  No murmur heard. No BLE edema. Pulmonary/Chest: Effort normal and breath sounds normal. No respiratory distress. Abdominal: Soft.  There is no tenderness. MSK: tenderness to right side neck, no cervical  tenderness, patient has full range of motion of all extremities. Pelvis stable.  Psychiatric: Patient has a normal mood and affect. behavior is normal. Judgment and thought content normal.  Results for orders placed or performed in visit on 11/03/21  Lipid panel  Result Value Ref Range   Cholesterol 255 (H) <200 mg/dL   HDL 39 (L) > OR = 50 mg/dL   Triglycerides 503 (H) <150 mg/dL   LDL Cholesterol (Calc)  mg/dL (calc)   Total CHOL/HDL Ratio 6.5 (H) <5.0 (calc)   Non-HDL Cholesterol (Calc) 216 (H) <130 mg/dL (calc)  CBC with Differential/Platelet  Result Value Ref Range   WBC 6.6 3.8 - 10.8 Thousand/uL   RBC 6.38 (H) 3.80 - 5.10 Million/uL   Hemoglobin 13.6 11.7 - 15.5 g/dL   HCT 43.4 35.0 - 45.0 %   MCV 68.0 (L) 80.0 - 100.0 fL   MCH 21.3 (L) 27.0 - 33.0 pg   MCHC 31.3 (L) 32.0 - 36.0 g/dL   RDW 17.5 (H) 11.0 - 15.0 %   Platelets 309 140 - 400 Thousand/uL   MPV 11.5 7.5 - 12.5 fL   Neutro Abs 3,359 1,500 - 7,800 cells/uL   Lymphs Abs 2,488 850 - 3,900 cells/uL   Absolute Monocytes 548 200 - 950 cells/uL   Eosinophils Absolute 132 15 - 500 cells/uL   Basophils Absolute 73 0 - 200 cells/uL   Neutrophils Relative % 50.9 %   Total Lymphocyte 37.7 %   Monocytes Relative 8.3 %   Eosinophils Relative 2.0 %   Basophils Relative 1.1 %   Smear Review    COMPLETE METABOLIC PANEL WITH GFR  Result Value Ref Range   Glucose, Bld 98 65 - 99 mg/dL   BUN 10 7 - 25 mg/dL   Creat 0.74 0.50 - 1.05 mg/dL   eGFR 90 > OR = 60 mL/min/1.22m   BUN/Creatinine Ratio NOT APPLICABLE 6 - 22 (calc)   Sodium 141 135 - 146 mmol/L   Potassium 4.4 3.5 - 5.3 mmol/L   Chloride 103 98 - 110 mmol/L   CO2 30 20 - 32 mmol/L   Calcium 9.3 8.6 - 10.4 mg/dL   Total Protein 6.6 6.1 - 8.1 g/dL   Albumin 4.3 3.6 - 5.1 g/dL   Globulin 2.3 1.9 - 3.7 g/dL (calc)   AG Ratio 1.9 1.0 - 2.5 (calc)   Total Bilirubin 0.3 0.2 - 1.2 mg/dL   Alkaline phosphatase (APISO) 51 37 - 153 U/L   AST 15 10 - 35 U/L   ALT 13 6 -  29 U/L  Hemoglobin A1c  Result Value Ref Range   Hgb A1c MFr Bld 5.9 (H) <5.7 % of total Hgb   Mean Plasma  Glucose 123 mg/dL   eAG (mmol/L) 6.8 mmol/L      Assessment & Plan:   Problem List Items Addressed This Visit       Cardiovascular and Mediastinum   Essential hypertension, benign - Primary (Chronic)    Patient blood pressure at goal today at 128/78.  Continue taking olmesartan 25 mg daily and triamterene-hydrochlorothiazide 37.5-25 mg daily.      Relevant Medications   triamterene-hydrochlorothiazide (MAXZIDE-25) 37.5-25 MG tablet   olmesartan (BENICAR) 20 MG tablet   atorvastatin (LIPITOR) 10 MG tablet   Other Relevant Orders   CBC with Differential/Platelet   COMPLETE METABOLIC PANEL WITH GFR     Respiratory   Seasonal allergic rhinitis due to pollen    Patient's allergies controlled with Claritin and as Azelastine nasal spray.  We will send in refills.      Relevant Medications   loratadine (CLARITIN) 10 MG tablet   Azelastine HCl 137 MCG/SPRAY SOLN     Other   HLD (hyperlipidemia)    Patient has agreed to start atorvastatin 10 mg daily.  Last LDL was unreadable due to her high triglycerides.       Relevant Medications   triamterene-hydrochlorothiazide (MAXZIDE-25) 37.5-25 MG tablet   olmesartan (BENICAR) 20 MG tablet   atorvastatin (LIPITOR) 10 MG tablet   Other Relevant Orders   Lipid panel   Other Visit Diagnoses     Encounter for screening mammogram for malignant neoplasm of breast       Relevant Orders   MM Digital Screening   Screening for colon cancer       Relevant Orders   Ambulatory referral to Gastroenterology   Fall, initial encounter       Discussed taking ibuprofen and Tylenol for pain.  We will send in mild muscle relaxer.  Discussed using ice therapy.   Muscle spasms of neck       Mild muscle relaxer sent to pharmacy.  Can take Tylenol and ibuprofen for pain.  Apply ice therapy.   Relevant Medications   cyclobenzaprine (FLEXERIL) 5  MG tablet        Follow up plan: Return in about 6 months (around 11/03/2022) for follow up.

## 2022-05-04 NOTE — Assessment & Plan Note (Signed)
Patient blood pressure at goal today at 128/78.  Continue taking olmesartan 25 mg daily and triamterene-hydrochlorothiazide 37.5-25 mg daily.

## 2022-05-04 NOTE — Assessment & Plan Note (Signed)
Patient's allergies controlled with Claritin and as Azelastine nasal spray.  We will send in refills.

## 2022-05-04 NOTE — Assessment & Plan Note (Signed)
Patient has agreed to start atorvastatin 10 mg daily.  Last LDL was unreadable due to her high triglycerides.

## 2022-05-05 LAB — COMPLETE METABOLIC PANEL WITH GFR
AG Ratio: 1.6 (calc) (ref 1.0–2.5)
ALT: 16 U/L (ref 6–29)
AST: 16 U/L (ref 10–35)
Albumin: 4.5 g/dL (ref 3.6–5.1)
Alkaline phosphatase (APISO): 64 U/L (ref 37–153)
BUN/Creatinine Ratio: 13 (calc) (ref 6–22)
BUN: 14 mg/dL (ref 7–25)
CO2: 30 mmol/L (ref 20–32)
Calcium: 10.1 mg/dL (ref 8.6–10.4)
Chloride: 103 mmol/L (ref 98–110)
Creat: 1.09 mg/dL — ABNORMAL HIGH (ref 0.50–1.05)
Globulin: 2.8 g/dL (calc) (ref 1.9–3.7)
Glucose, Bld: 88 mg/dL (ref 65–99)
Potassium: 5 mmol/L (ref 3.5–5.3)
Sodium: 141 mmol/L (ref 135–146)
Total Bilirubin: 0.3 mg/dL (ref 0.2–1.2)
Total Protein: 7.3 g/dL (ref 6.1–8.1)
eGFR: 56 mL/min/{1.73_m2} — ABNORMAL LOW (ref >=60)

## 2022-05-05 LAB — LIPID PANEL
Cholesterol: 272 mg/dL — ABNORMAL HIGH (ref <200)
HDL: 37 mg/dL — ABNORMAL LOW (ref >=50)
LDL Cholesterol (Calc): 186 mg/dL (calc) — ABNORMAL HIGH
Non-HDL Cholesterol (Calc): 235 mg/dL (calc) — ABNORMAL HIGH (ref <130)
Total CHOL/HDL Ratio: 7.4 (calc) — ABNORMAL HIGH (ref <5.0)
Triglycerides: 275 mg/dL — ABNORMAL HIGH (ref <150)

## 2022-05-05 LAB — CBC WITH DIFFERENTIAL/PLATELET
Absolute Monocytes: 683 cells/uL (ref 200–950)
Basophils Absolute: 91 cells/uL (ref 0–200)
Basophils Relative: 1 %
Eosinophils Absolute: 91 cells/uL (ref 15–500)
Eosinophils Relative: 1 %
HCT: 47.9 % — ABNORMAL HIGH (ref 35.0–45.0)
Hemoglobin: 14.4 g/dL (ref 11.7–15.5)
Lymphs Abs: 2275 cells/uL (ref 850–3900)
MCH: 20.9 pg — ABNORMAL LOW (ref 27.0–33.0)
MCHC: 30.1 g/dL — ABNORMAL LOW (ref 32.0–36.0)
MCV: 69.5 fL — ABNORMAL LOW (ref 80.0–100.0)
MPV: 10.5 fL (ref 7.5–12.5)
Monocytes Relative: 7.5 %
Neutro Abs: 5961 cells/uL (ref 1500–7800)
Neutrophils Relative %: 65.5 %
Platelets: 314 10*3/uL (ref 140–400)
RBC: 6.89 10*6/uL — ABNORMAL HIGH (ref 3.80–5.10)
RDW: 17.5 % — ABNORMAL HIGH (ref 11.0–15.0)
Total Lymphocyte: 25 %
WBC: 9.1 10*3/uL (ref 3.8–10.8)

## 2022-06-01 ENCOUNTER — Other Ambulatory Visit: Payer: Self-pay | Admitting: Emergency Medicine

## 2022-06-01 DIAGNOSIS — E785 Hyperlipidemia, unspecified: Secondary | ICD-10-CM

## 2022-06-01 MED ORDER — ATORVASTATIN CALCIUM 10 MG PO TABS: 10.0000 mg | ORAL_TABLET | Freq: Every day | ORAL | 0 refills | Status: DC

## 2022-07-18 ENCOUNTER — Ambulatory Visit: Payer: Self-pay

## 2022-07-18 NOTE — Telephone Encounter (Signed)
Pt states she needs to restart a med that was prescribed last year for drainage.  She is having more drainage which causes a cough.  It is worse at night.    Chief Complaint: "My allergies are acting up. I have sinus drainage that makes me cough at night. I need something to help with this." Symptoms: Above Frequency: 1 week ago Pertinent Negatives: Patient denies fever Disposition: [] ED /[] Urgent Care (no appt availability in office) / [] Appointment(In office/virtual)/ []  [place] [facility]/ [] Home Care/ [] Refused Recommended Disposition /[] [place] [facility]/ [x]  Follow-up with PCP Additional Notes: Please advise pt.  Answer Assessment - Initial Assessment Questions 1. LOCATION: "Where does it hurt?"      Sinus drainage 2. ONSET: "When did the sinus pain start?"  (e.g., hours, days)      1 week 3. SEVERITY: "How bad is the pain?"   (Scale 1-10; mild, moderate or severe)   - MILD (1-3): doesn't interfere with normal activities    - MODERATE (4-7): interferes with normal activities (e.g., work or school) or awakens from sleep   - SEVERE (8-10): excruciating pain and patient unable to do any normal activities        Moderate 4. RECURRENT SYMPTOM: "Have you ever had sinus problems before?" If Yes, ask: "When was the last time?" and "What happened that time?"      Yes  5. NASAL CONGESTION: "Is the nose blocked?" If Yes, ask: "Can you open it or must you breathe through your mouth?"     Drainage 6. NASAL DISCHARGE: "Do you have discharge from your nose?" If so ask, "What color?"     Clear 7. FEVER: "Do you have a fever?" If Yes, ask: "What is it, how was it measured, and when did it start?"      No 8. OTHER SYMPTOMS: "Do you have any other symptoms?" (e.g., sore throat, cough, earache, difficulty breathing)     Cough 9. PREGNANCY: "Is there any chance you are pregnant?" "When was your last menstrual period?"     No  Protocols used: Sinus Pain or Congestion-A-AH

## 2022-07-19 ENCOUNTER — Other Ambulatory Visit: Payer: Self-pay

## 2022-07-19 ENCOUNTER — Telehealth (INDEPENDENT_AMBULATORY_CARE_PROVIDER_SITE_OTHER): Payer: Medicare Other | Admitting: Nurse Practitioner

## 2022-07-19 ENCOUNTER — Telehealth: Payer: Self-pay | Admitting: Nurse Practitioner

## 2022-07-19 ENCOUNTER — Encounter: Payer: Self-pay | Admitting: Nurse Practitioner

## 2022-07-19 DIAGNOSIS — J0140 Acute pansinusitis, unspecified: Secondary | ICD-10-CM

## 2022-07-19 DIAGNOSIS — J301 Allergic rhinitis due to pollen: Secondary | ICD-10-CM

## 2022-07-19 DIAGNOSIS — R051 Acute cough: Secondary | ICD-10-CM | POA: Diagnosis not present

## 2022-07-19 MED ORDER — PROMETHAZINE-DM 6.25-15 MG/5ML PO SYRP: 5.0000 mL | ORAL_SOLUTION | Freq: Four times a day (QID) | ORAL | 0 refills | Status: DC | PRN

## 2022-07-19 MED ORDER — DOXYCYCLINE HYCLATE 100 MG PO TABS: 100.0000 mg | ORAL_TABLET | Freq: Two times a day (BID) | ORAL | 0 refills | Status: AC

## 2022-07-19 NOTE — Telephone Encounter (Signed)
Patient can do a virtual

## 2022-07-19 NOTE — Assessment & Plan Note (Signed)
Continue taking Claritin and azelastine nasal spray.  Will start treatment for sinus infection with doxycycline and send in cough medication.

## 2022-07-19 NOTE — Progress Notes (Signed)
Name: Vanessa Gross   MRN: [id number]    DOB: Apr 30, 1956   Date:07/19/2022       Progress Note  Subjective  Chief Complaint  Chief Complaint  Patient presents with   Cough    drainage    I connected with  Lucio Edward Kirsh  on 07/19/22 at 2:00 pm by a telephone enabled telemedicine application and verified that I am speaking with the correct person using two identifiers.  I discussed the limitations of evaluation and management by telemedicine and the availability of in person appointments. The patient expressed understanding and agreed to proceed with a virtual visit  Staff also discussed with the patient that there may be a patient responsible charge related to this service. Patient Location: home Provider Location: [facility] Additional Individuals present: alone  HPI  Allergies/sinus infection/cough: Patient reports that her allergies have gotten worse over the last two weeks. She is currently taking Claritin 10 mg daily and azelastine nasal spray.  She says she has sinus drainage which is draining and making her cough. She says she has nasal congestion and facial pressure.  She says it makes her have very bad coughing fits.  She denies any fever or shortness of breath.  Patient is outside the window for treatment of covid, testing deferred.  Will send in antibiotics and cough medication.  Push fluids.   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 05/25/2007    Social History   Tobacco Use   Smoking status: Never   Smokeless tobacco: Never  Substance Use Topics   Alcohol use: No    Alcohol/week: 0.0 standard drinks of alcohol     Current Outpatient  Medications:    aspirin EC 81 MG tablet, Take 1 tablet (81 mg total) by mouth daily., Disp: , Rfl:    atorvastatin (LIPITOR) 10 MG tablet, Take 1 tablet (10 mg total) by mouth daily., Disp: 90 tablet, Rfl: 0   Azelastine HCl 137 MCG/SPRAY SOLN, USE 2 SPRAY(S) IN EACH NOSTRIL TWICE DAILY AS DIRECTED, Disp: 30 mL, Rfl: 3   cyclobenzaprine (FLEXERIL) 5 MG tablet, Take 1 tablet (5 mg total) by mouth 3 (three) times daily as needed for muscle spasms., Disp: 30 tablet, Rfl: 0   doxycycline (VIBRA-TABS) 100 MG tablet, Take 1 tablet (100 mg total) by mouth 2 (two) times daily for 10 days., Disp: 20 tablet, Rfl: 0   loratadine (CLARITIN) 10 MG tablet, Take 1 tablet (10 mg total) by mouth daily., Disp: 90 tablet, Rfl: 3   Multiple Vitamin (MULTI-VITAMINS) TABS, Take by mouth., Disp: , Rfl:    olmesartan (BENICAR) 20 MG tablet, Take 1 tablet (20 mg total) by mouth daily., Disp: 90 tablet, Rfl: 3   promethazine-dextromethorphan (PROMETHAZINE-DM) 6.25-15 MG/5ML syrup, Take 5 mLs by mouth 4 (four) times daily as needed for cough., Disp: 118 mL, Rfl: 0   triamterene-hydrochlorothiazide (MAXZIDE-25) 37.5-25 MG tablet, Take 1 tablet by mouth daily., Disp: 90 tablet, Rfl: 3  Allergies  Allergen Reactions   Celecoxib    Gabapentin     abnormal dreams    I personally reviewed active problem list, medication list, allergies with the patient/caregiver today.  ROS  Constitutional: Negative for  fever or weight change.  HEENT: positive for nasal congestion, facial pressure Respiratory: positive for cough and negative shortness of breath.   Cardiovascular: Negative for chest pain or palpitations.  Gastrointestinal: Negative for abdominal pain, no bowel changes.  Musculoskeletal: Negative for gait problem or joint swelling.  Skin: Negative for rash.  Neurological: Negative for dizziness or headache.  No other specific complaints in a complete review of systems (except as listed in HPI above).    Objective  Virtual encounter, vitals not obtained.  There is no height or weight on file to calculate BMI.  Nursing Note and Vital Signs reviewed.  Physical Exam  Awake, alert and oriented, speaking in complete sentences  No results found for this or any previous visit (from the past 72 hour(s)).  Assessment & Plan  Problem List Items Addressed This Visit       Respiratory   Seasonal allergic rhinitis due to pollen - Primary    Continue taking Claritin and azelastine nasal spray.  Will start treatment for sinus infection with doxycycline and send in cough medication.       Other Visit Diagnoses     Acute non-recurrent pansinusitis       Continue taking Claritin and azelastine nasal spray.  Will start treatment for sinus infection with doxycycline and send in cough medication.    Relevant Medications   doxycycline (VIBRA-TABS) 100 MG tablet   promethazine-dextromethorphan (PROMETHAZINE-DM) 6.25-15 MG/5ML syrup   Acute cough       Continue taking Claritin and azelastine nasal spray.  Will start treatment for sinus infection with doxycycline and send in cough medication.    Relevant Medications   promethazine-dextromethorphan (PROMETHAZINE-DM) 6.25-15 MG/5ML syrup      Push fluids, can also do saline nasal washes May use cough drops and take mucinex  -Red flags and when to present for emergency care or RTC including fever >101.78F, chest pain, shortness of breath, new/worsening/un-resolving symptoms,  reviewed with patient at time of visit. Follow up and care instructions discussed and provided in AVS. - I discussed the assessment and treatment plan with the patient. The patient was provided an opportunity to ask questions and all were answered. The patient agreed with the plan and demonstrated an understanding of the instructions.  I provided 20 minutes of non-face-to-face time during this encounter.  Berniece Salines, FNP

## 2022-07-19 NOTE — Telephone Encounter (Unsigned)
Copied from CRM [id number]. Topic: General - Inquiry >> Jul 19, 2022  1:12 PM De Blanch wrote: Reason for CRM: Pt is returning missed call. Pt stated after her appointment with Raynelle Fanning she noticed she had a missed call. Pt stated no voicemail was left.    Please advise.

## 2022-08-05 ENCOUNTER — Ambulatory Visit: Payer: Self-pay | Admitting: *Deleted

## 2022-08-05 NOTE — Telephone Encounter (Signed)
  Chief Complaint: pain in left calf, swelling requesting appt Symptoms: left leg calf pain swelling . Pain traveling to thigh and left side of stomach. Patient taking aleeve and applying "ointment' without relief.  Frequency: 1 month  Pertinent Negatives: Patient denies chest pain no difficulty breathing  Disposition: [x] ED /[] Urgent Care (no appt availability in office) / [] Appointment(In office/virtual)/ []  [place] [facility]/ [] Home Care/ [] Refused Recommended Disposition /[] [place] [facility]/ []  Follow-up with PCP Additional Notes:   Recommended ED now . Patient reports she is not going to ED she can not afford to go . Called [facility] to advise patient refused to go to ED.     Reason for Disposition  [1] Thigh, calf, or ankle swelling AND [2] only 1 side  Answer Assessment - Initial Assessment Questions 1. ONSET: "When did the pain start?"      1 month ago  2. LOCATION: "Where is the pain located?"      Left calf now in thigh, left side of stomach  3. PAIN: "How bad is the pain?"    (Scale 1-10; or mild, moderate, severe)   -  MILD (1-3): doesn't interfere with normal activities    -  MODERATE (4-7): interferes with normal activities (e.g., work or school) or awakens from sleep, limping    -  SEVERE (8-10): excruciating pain, unable to do any normal activities, unable to walk     Pain in left calf  4. WORK OR EXERCISE: "Has there been any recent work or exercise that involved this part of the body?"      Na  5. CAUSE: "What do you think is causing the leg pain?"     Not sure 6. OTHER SYMPTOMS: "Do you have any other symptoms?" (e.g., chest pain, back pain, breathing difficulty, swelling, rash, fever, numbness, weakness)     Left calf pain swelling. Left "stomach " pain .  7. PREGNANCY: "Is there any chance you are pregnant?" "When was your last menstrual period?"     na  Protocols used: Leg Pain-A-AH

## 2022-08-05 NOTE — Telephone Encounter (Signed)
Based on the assessment done by nurse triage, I also informed patient she would need to go to the ER to have further work up done.  Patient refused stating that she could not afford to go to the ER and just wanted and appointment for Monday, 08/05/22.  I made patient an appointment for 08/08/22 @ 8:20 AM but told her if her symptoms continue to get worse that she needs to go to the ER.  Patient verbalized understanding.

## 2022-08-08 ENCOUNTER — Ambulatory Visit
Admission: RE | Admit: 2022-08-08 | Discharge: 2022-08-08 | Disposition: A | Discharge status: Home / Self Care | Payer: Medicare Other | Source: Ambulatory Visit | Attending: Nurse Practitioner | Admitting: Nurse Practitioner

## 2022-08-08 ENCOUNTER — Encounter: Payer: Self-pay | Admitting: Nurse Practitioner

## 2022-08-08 ENCOUNTER — Ambulatory Visit (INDEPENDENT_AMBULATORY_CARE_PROVIDER_SITE_OTHER): Payer: Medicare Other | Admitting: Nurse Practitioner

## 2022-08-08 ENCOUNTER — Other Ambulatory Visit: Payer: Self-pay

## 2022-08-08 ENCOUNTER — Other Ambulatory Visit: Payer: Self-pay | Admitting: Nurse Practitioner

## 2022-08-08 VITALS — BP 132/84 | HR 90 | Temp 97.5°F | Resp 18 | Ht 62.0 in | Wt 213.2 lb

## 2022-08-08 DIAGNOSIS — M79662 Pain in left lower leg: Secondary | ICD-10-CM

## 2022-08-08 DIAGNOSIS — Z1231 Encounter for screening mammogram for malignant neoplasm of breast: Secondary | ICD-10-CM

## 2022-08-08 MED ORDER — PREDNISONE 10 MG (21) PO TBPK: ORAL_TABLET | ORAL | 0 refills | Status: DC

## 2022-08-08 NOTE — Addendum Note (Signed)
Addended by: Bo Merino on: 08/08/2022 08:53 AM   Modules accepted: Orders

## 2022-08-08 NOTE — Progress Notes (Signed)
BP 132/84   Pulse 90   Temp (!) 97.5 F (36.4 C) (Oral)   Resp 18   Ht '5\' 2"'  (1.575 m)   Wt 213 lb 3.2 oz (96.7 kg)   BMI 38.99 kg/m    Subjective:    Patient ID: Vanessa Gross, female    DOB: 03/07/1956, 66 y.o.   MRN: [id number]  HPI: Vanessa Gross is a 66 y.o. female  Chief Complaint  Patient presents with   Leg Pain    Left leg pain that started in calf and radiate to hip under abdomen for 1 month   Left calf pain: Patient reports that she has had left calf pain for about a month.  She says the pain has now increased up her leg.  She denies any injury or long car trips.  She is not a smoker.  She is less active due to not working.  She does have calf tenderness and swelling noted. Will get stat doppler. Patient states she has been taking aleve but it has not been helping. Patient requesting pain medication advised patient she can take tylenol for now.  Her GFR is low advised patient not to take aleve, ibuprofen, advil or naproxen.   Relevant past medical, surgical, family and social history reviewed and updated as indicated. Interim medical history since our last visit reviewed. Allergies and medications reviewed and updated.  Review of Systems  Constitutional: Negative for fever or weight change.  Respiratory: Negative for cough and shortness of breath.   Cardiovascular: Negative for chest pain or palpitations.  Gastrointestinal: Negative for abdominal pain, no bowel changes.  Musculoskeletal: Negative for gait problem or joint swelling.  Skin: Negative for rash.  Neurological: Negative for dizziness or headache.  LLE: positive for pain and swelling No other specific complaints in a complete review of systems (except as listed in HPI above).      Objective:    BP 132/84   Pulse 90   Temp (!) 97.5 F (36.4 C) (Oral)   Resp 18   Ht '5\' 2"'  (1.575 m)   Wt 213 lb 3.2 oz (96.7 kg)   BMI 38.99 kg/m   Wt Readings from Last 3 Encounters:  08/08/22 213 lb 3.2 oz  (96.7 kg)  05/04/22 211 lb 12.8 oz (96.1 kg)  11/03/21 214 lb 3.2 oz (97.2 kg)    Physical Exam  Constitutional: Patient appears well-developed and well-nourished. Obese  No distress.  HEENT: head atraumatic, normocephalic, pupils equal and reactive to light,  neck supple Cardiovascular: Normal rate, regular rhythm and normal heart sounds.  No murmur heard. Left lower leg edema, calf tenderness, pedal pulse present Pulmonary/Chest: Effort normal and breath sounds normal. No respiratory distress. Abdominal: Soft.  There is no tenderness. Psychiatric: Patient has a normal mood and affect. behavior is normal. Judgment and thought content normal.   Results for orders placed or performed in visit on 05/04/22  Lipid panel  Result Value Ref Range   Cholesterol 272 (H) <200 mg/dL   HDL 37 (L) > OR = 50 mg/dL   Triglycerides 275 (H) <150 mg/dL   LDL Cholesterol (Calc) 186 (H) mg/dL (calc)   Total CHOL/HDL Ratio 7.4 (H) <5.0 (calc)   Non-HDL Cholesterol (Calc) 235 (H) <130 mg/dL (calc)  CBC with Differential/Platelet  Result Value Ref Range   WBC 9.1 3.8 - 10.8 Thousand/uL   RBC 6.89 (H) 3.80 - 5.10 Million/uL   Hemoglobin 14.4 11.7 - 15.5 g/dL   HCT 47.9 (  H) 35.0 - 45.0 %   MCV 69.5 (L) 80.0 - 100.0 fL   MCH 20.9 (L) 27.0 - 33.0 pg   MCHC 30.1 (L) 32.0 - 36.0 g/dL   RDW 17.5 (H) 11.0 - 15.0 %   Platelets 314 140 - 400 Thousand/uL   MPV 10.5 7.5 - 12.5 fL   Neutro Abs 5,961 1,500 - 7,800 cells/uL   Lymphs Abs 2,275 850 - 3,900 cells/uL   Absolute Monocytes 683 200 - 950 cells/uL   Eosinophils Absolute 91 15 - 500 cells/uL   Basophils Absolute 91 0 - 200 cells/uL   Neutrophils Relative % 65.5 %   Total Lymphocyte 25.0 %   Monocytes Relative 7.5 %   Eosinophils Relative 1.0 %   Basophils Relative 1.0 %  COMPLETE METABOLIC PANEL WITH GFR  Result Value Ref Range   Glucose, Bld 88 65 - 99 mg/dL   BUN 14 7 - 25 mg/dL   Creat 1.09 (H) 0.50 - 1.05 mg/dL   eGFR 56 (L) > OR = 60  mL/min/1.24m   BUN/Creatinine Ratio 13 6 - 22 (calc)   Sodium 141 135 - 146 mmol/L   Potassium 5.0 3.5 - 5.3 mmol/L   Chloride 103 98 - 110 mmol/L   CO2 30 20 - 32 mmol/L   Calcium 10.1 8.6 - 10.4 mg/dL   Total Protein 7.3 6.1 - 8.1 g/dL   Albumin 4.5 3.6 - 5.1 g/dL   Globulin 2.8 1.9 - 3.7 g/dL (calc)   AG Ratio 1.6 1.0 - 2.5 (calc)   Total Bilirubin 0.3 0.2 - 1.2 mg/dL   Alkaline phosphatase (APISO) 64 37 - 153 U/L   AST 16 10 - 35 U/L   ALT 16 6 - 29 U/L      Assessment & Plan:   Problem List Items Addressed This Visit   None Visit Diagnoses     Pain of left calf    -  Primary   get stat doppler to rule out DVT, can take tylenol for pain at this time.    Relevant Orders   VAS [place] EXT[place] VEN[place] (DVT[place]   Encounter for screening mammogram for malignant neoplasm of breast       Relevant Orders   MM 3D SCREEN BREAST BILATERAL        Follow up plan: Return if symptoms worsen or fail to improve.

## 2022-08-15 ENCOUNTER — Ambulatory Visit: Payer: Self-pay | Admitting: *Deleted

## 2022-08-15 NOTE — Telephone Encounter (Signed)
Pt refused appt for today and requested the next appt for when her dr would be in, therefore I have scheduled her for Wednesday with Serafina Royals.

## 2022-08-15 NOTE — Telephone Encounter (Signed)
Reason for Disposition  [1] MODERATE pain (e.g., interferes with normal activities) AND [2] pain comes and goes (cramps) AND [3] present > 24 hours  (Exception: Pain with Vomiting or Diarrhea - see that Guideline.)  [1] Pain radiates into the thigh or further down the leg AND [2] one leg    Prednisone and Tylenol not helping the pain.  Answer Assessment - Initial Assessment Questions 1. LOCATION: "Where does it hurt?"      Having left sided pain.   I have sciatica pain.    It went from my leg to my groin and into my back.   I think it's my sciatica.    I had a test done at [facility] an MRI.   It was on my thigh and leg. 2. RADIATION: "Does the pain shoot anywhere else?" (e.g., chest, back)     Down my leg.   I saw Almyra Free last week.   I was referred for an MRI.   3. ONSET: "When did the pain begin?" (e.g., minutes, hours or days ago)      The pain is not better.   The prednisone she gave me.   It was not strong enough.   She told me not to take Aleve and Ibuprofen.    The Tylenol is not working.   I need something for this pain.   4. SUDDEN: "Gradual or sudden onset?"     *No Answer* 5. PATTERN "Does the pain come and go, or is it constant?"    - If it comes and goes: "How long does it last?" "Do you have pain now?"     (Note: Comes and goes means the pain is intermittent. It goes away completely between bouts.)    - If constant: "Is it getting better, staying the same, or getting worse?"      (Note: Constant means the pain never goes away completely; most serious pain is constant and gets worse.)      *No Answer* 6. SEVERITY: "How bad is the pain?"  (e.g., Scale 1-10; mild, moderate, or severe)    - MILD (1-3): Doesn't interfere with normal activities, abdomen soft and not tender to touch.     - MODERATE (4-7): Interferes with normal activities or awakens from sleep, abdomen tender to touch.     - SEVERE (8-10): Excruciating pain, doubled over, unable to do any normal activities.       *No  Answer* 7. RECURRENT SYMPTOM: "Have you ever had this type of stomach pain before?" If Yes, ask: "When was the last time?" and "What happened that time?"      *No Answer* 8. CAUSE: "What do you think is causing the stomach pain?"     *No Answer* 9. RELIEVING/AGGRAVATING FACTORS: "What makes it better or worse?" (e.g., antacids, bending or twisting motion, bowel movement)     *No Answer* 10. OTHER SYMPTOMS: "Do you have any other symptoms?" (e.g., back pain, diarrhea, fever, urination pain, vomiting)       *No Answer* 11. PREGNANCY: "Is there any chance you are pregnant?" "When was your last menstrual period?"       *No Answer*  Answer Assessment - Initial Assessment Questions 1. ONSET: "When did the pain begin?"      Pt was seen last week for sciatica severe pain and referred for an MRI.   She had that done on Friday.  2. LOCATION: "Where does it hurt?" (upper, mid or lower back)     Lower back side  and down her leg and into her groin.   "It's the same pain I had when I was in last week but this medicine she gave me has not helped.   The prednisone is not strong enough.  The Tylenol she told me to take does not help at all   She told me not to take Aleve and ibuprofen but they work better than Tylenol. 3. SEVERITY: "How bad is the pain?"  (e.g., Scale 1-10; mild, moderate, or severe)   - MILD (1-3): Doesn't interfere with normal activities.    - MODERATE (4-7): Interferes with normal activities or awakens from sleep.    - SEVERE (8-10): Excruciating pain, unable to do any normal activities.      Severe.   I'm tired of this pain.   I need something for this pain. 4. PATTERN: "Is the pain constant?" (e.g., yes, no; constant, intermittent)      Yes 5. RADIATION: "Does the pain shoot into your legs or somewhere else?"     Down my leg and into my groin. 6. CAUSE:  "What do you think is causing the back pain?"      My sciatica.     I fell in the tub on June 28 and I think that got my sciatic  pain started again.  Della Goo, FNP is aware of this fall when I asked pt about it. 7. BACK OVERUSE:  "Any recent lifting of heavy objects, strenuous work or exercise?"     Fell in tub but provider aware. 8. MEDICINES: "What have you taken so far for the pain?" (e.g., nothing, acetaminophen, NSAIDS)     Prednisone and Tylenol.   Neither is helping me at all. 9. NEUROLOGIC SYMPTOMS: "Do you have any weakness, numbness, or problems with bowel/bladder control?"     Not asked since been evaluated for this last week. 10. OTHER SYMPTOMS: "Do you have any other symptoms?" (e.g., fever, abdomen pain, burning with urination, blood in urine)       No 11. PREGNANCY: "Is there any chance you are pregnant?" "When was your last menstrual period?"       N/A  Protocols used: Abdominal Pain - Female-A-AH, Back Pain-A-AH

## 2022-08-15 NOTE — Telephone Encounter (Signed)
  Chief Complaint: sciatic pain continues in spite of prednisone and Tylenol she was given last week. Symptoms: Lower back, side and down her leg and into her groin.   Seen last week by Vanessa Royals, FNP for this pain. Frequency: Constant pain. Pertinent Negatives: Patient denies pain being any better with the prednisone and Tylenol Disposition: [] ED /[] Urgent Care (no appt availability in office) / [] Appointment(In office/virtual)/ []  [facility]/ [] Home Care/ [] Refused Recommended Disposition /[] [facility]/ [x]  Follow-up with PCP Additional Notes: I have sent a high priority message to Vanessa Royals, FNP.    Pt agreeable to being called back.   She is in a lot of pain and requesting to be called back as soon as possible regarding some stronger pain medication.

## 2022-08-15 NOTE — Telephone Encounter (Signed)
Sciatica pain left side flare up can something be called in or do she need appointment. Taking tylenol

## 2022-08-15 NOTE — Telephone Encounter (Signed)
Please call and schedule patient appointment 

## 2022-08-16 NOTE — Progress Notes (Unsigned)
 There were no vitals taken for this visit.   Subjective:    Patient ID: Vanessa Gross, female    DOB: 11/22/1955, 66 y.o.   MRN: [id number]  HPI: [name] is a 66 y.o. female  No chief complaint on file.  Lower back pain:  patient reports she has had left lower back pain radiating down her left leg and into her left groin.  Patient was seen on 08/08/2022 for left calf pain radiating up her leg.  Obtained a doppler ultrasound and ruled out DVT.  Patient was prescribed steroid taper and instructed to use tylenol for pain. Patient has struggled with sciatica for a while now.  She was previously referred to orthopedics but never saw them.    Relevant past medical, surgical, family and social history reviewed and updated as indicated. Interim medical history since our last visit reviewed. Allergies and medications reviewed and updated.  Review of Systems  Per HPI unless specifically indicated above     Objective:    There were no vitals taken for this visit.  Wt Readings from Last 3 Encounters:  08/08/22 213 lb 3.2 oz (96.7 kg)  05/04/22 211 lb 12.8 oz (96.1 kg)  11/03/21 214 lb 3.2 oz (97.2 kg)    Physical Exam  Results for orders placed or performed in visit on 05/04/22  Lipid panel  Result Value Ref Range   Cholesterol 272 (H) <200 mg/dL   HDL 37 (L) > OR = 50 mg/dL   Triglycerides 275 (H) <150 mg/dL   LDL Cholesterol (Calc) 186 (H) mg/dL (calc)   Total CHOL/HDL Ratio 7.4 (H) <5.0 (calc)   Non-HDL Cholesterol (Calc) 235 (H) <130 mg/dL (calc)  CBC with Differential/Platelet  Result Value Ref Range   WBC 9.1 3.8 - 10.8 Thousand/uL   RBC 6.89 (H) 3.80 - 5.10 Million/uL   Hemoglobin 14.4 11.7 - 15.5 g/dL   HCT 47.9 (H) 35.0 - 45.0 %   MCV 69.5 (L) 80.0 - 100.0 fL   MCH 20.9 (L) 27.0 - 33.0 pg   MCHC 30.1 (L) 32.0 - 36.0 g/dL   RDW 17.5 (H) 11.0 - 15.0 %   Platelets 314 140 - 400 Thousand/uL   MPV 10.5 7.5 - 12.5 fL   Neutro Abs 5,961 1,500 - 7,800 cells/uL    Lymphs Abs 2,275 850 - 3,900 cells/uL   Absolute Monocytes 683 200 - 950 cells/uL   Eosinophils Absolute 91 15 - 500 cells/uL   Basophils Absolute 91 0 - 200 cells/uL   Neutrophils Relative % 65.5 %   Total Lymphocyte 25.0 %   Monocytes Relative 7.5 %   Eosinophils Relative 1.0 %   Basophils Relative 1.0 %  COMPLETE METABOLIC PANEL WITH GFR  Result Value Ref Range   Glucose, Bld 88 65 - 99 mg/dL   BUN 14 7 - 25 mg/dL   Creat 1.09 (H) 0.50 - 1.05 mg/dL   eGFR 56 (L) > OR = 60 mL/min/1.73m2   BUN/Creatinine Ratio 13 6 - 22 (calc)   Sodium 141 135 - 146 mmol/L   Potassium 5.0 3.5 - 5.3 mmol/L   Chloride 103 98 - 110 mmol/L   CO2 30 20 - 32 mmol/L   Calcium 10.1 8.6 - 10.4 mg/dL   Total Protein 7.3 6.1 - 8.1 g/dL   Albumin 4.5 3.6 - 5.1 g/dL   Globulin 2.8 1.9 - 3.7 g/dL (calc)   AG Ratio 1.6 1.0 - 2.5 (calc)   Total Bilirubin 0.3   0.2 - 1.2 mg/dL   Alkaline phosphatase (APISO) 64 37 - 153 U/L   AST 16 10 - 35 U/L   ALT 16 6 - 29 U/L      Assessment & Plan:   Problem List Items Addressed This Visit   None    Follow up plan: No follow-ups on file.

## 2022-08-17 ENCOUNTER — Other Ambulatory Visit: Payer: Self-pay

## 2022-08-17 ENCOUNTER — Ambulatory Visit: Payer: Medicare Other | Admitting: Nurse Practitioner

## 2022-08-17 ENCOUNTER — Encounter: Payer: Self-pay | Admitting: Nurse Practitioner

## 2022-08-17 VITALS — BP 132/76 | Temp 98.1°F | Resp 16 | Ht 62.0 in | Wt 212.4 lb

## 2022-08-17 DIAGNOSIS — M5432 Sciatica, left side: Secondary | ICD-10-CM | POA: Diagnosis not present

## 2022-08-17 MED ORDER — TRIAMCINOLONE ACETONIDE 40 MG/ML IJ SUSP
40.0000 mg | Freq: Once | INTRAMUSCULAR | Status: AC
  Administered 2022-08-17: 40 mg via INTRAMUSCULAR

## 2022-08-17 MED ORDER — KETOROLAC TROMETHAMINE 60 MG/2ML IM SOLN
60.0000 mg | Freq: Once | INTRAMUSCULAR | Status: AC
  Administered 2022-08-17: 60 mg via INTRAMUSCULAR

## 2022-08-17 MED ORDER — METHOCARBAMOL 500 MG PO TABS: 500.0000 mg | ORAL_TABLET | Freq: Two times a day (BID) | ORAL | 0 refills | Status: DC | PRN

## 2022-08-17 MED ORDER — TRAMADOL HCL 50 MG PO TABS: 50.0000 mg | ORAL_TABLET | Freq: Three times a day (TID) | ORAL | 0 refills | Status: AC | PRN

## 2022-09-07 ENCOUNTER — Telehealth: Payer: Self-pay | Admitting: Nurse Practitioner

## 2022-09-07 ENCOUNTER — Encounter: Payer: Self-pay | Admitting: Nurse Practitioner

## 2022-09-07 ENCOUNTER — Other Ambulatory Visit: Payer: Self-pay

## 2022-09-07 ENCOUNTER — Ambulatory Visit (INDEPENDENT_AMBULATORY_CARE_PROVIDER_SITE_OTHER): Payer: Medicare Other | Admitting: Nurse Practitioner

## 2022-09-07 VITALS — BP 124/76 | Temp 98.2°F | Resp 16 | Ht 62.0 in | Wt 211.8 lb

## 2022-09-07 DIAGNOSIS — M5432 Sciatica, left side: Secondary | ICD-10-CM | POA: Diagnosis not present

## 2022-09-07 DIAGNOSIS — Z23 Encounter for immunization: Secondary | ICD-10-CM | POA: Diagnosis not present

## 2022-09-07 MED ORDER — TRIAMCINOLONE ACETONIDE 40 MG/ML IJ SUSP
40.0000 mg | Freq: Once | INTRAMUSCULAR | Status: AC
  Administered 2022-09-07: 40 mg via INTRAMUSCULAR

## 2022-09-07 MED ORDER — KETOROLAC TROMETHAMINE 60 MG/2ML IM SOLN
60.0000 mg | Freq: Once | INTRAMUSCULAR | Status: AC
  Administered 2022-09-07: 60 mg via INTRAMUSCULAR

## 2022-09-07 NOTE — Telephone Encounter (Signed)
What is she taking? Please send to pharmacy

## 2022-09-07 NOTE — Progress Notes (Signed)
BP 124/76   Temp 98.2 F (36.8 C) (Oral)   Resp 16   Ht 5' 2" (1.575 m)   Wt 211 lb 12.8 oz (96.1 kg)   BMI 38.74 kg/m    Subjective:    Patient ID: Vanessa Gross, female    DOB: [birth year], 66 y.o.   MRN: [id number]  HPI: Vanessa Gross is a 66 y.o. female  Chief Complaint  Patient presents with   Sciatica    Follow up   Sciatica: Last office visit was 08/17/2022. She was seen for sciatica.  She had previously been given a steroid taper and instructed to take tylenol for pain.  She was previously referred to orthopedics but she never went to see them.  She had reported that she used icy hot with  no improvement. We gave her a kenalog  and Toradol injection at last office visit.  Also gave her a  prescription for robaxin and a one time prescription for tramadol.  We also discussed stretches she could do and we had previously referred her to physical therapy.  Patient declined orthopedics and physical therapy.  She says she wants more medication.  Discussed with patient that the tramadol was a one-time prescription.  Treatment is limited due to her kidney function.  Discussed with patient highly recommend going to orthopedics or physical therapy.  Patient now agrees to go to orthopedics.  Referral placed.  Patient is asking for an injection of Toradol and Kenalog.  We will provide injection.  Relevant past medical, surgical, family and social history reviewed and updated as indicated. Interim medical history since our last visit reviewed. Allergies and medications reviewed and updated.  Review of Systems  Constitutional: Negative for fever or weight change.  Respiratory: Negative for cough and shortness of breath.   Cardiovascular: Negative for chest pain or palpitations.  Gastrointestinal: Negative for abdominal pain, no bowel changes.  Musculoskeletal: Negative for gait problem or joint swelling.  Positive for left-sided lower back pain Skin: Negative for rash.  Neurological:  Negative for dizziness or headache.  No other specific complaints in a complete review of systems (except as listed in HPI above).      Objective:    BP 124/76   Temp 98.2 F (36.8 C) (Oral)   Resp 16   Ht 5' 2" (1.575 m)   Wt 211 lb 12.8 oz (96.1 kg)   BMI 38.74 kg/m   Wt Readings from Last 3 Encounters:  09/07/22 211 lb 12.8 oz (96.1 kg)  08/17/22 212 lb 6.4 oz (96.3 kg)  08/08/22 213 lb 3.2 oz (96.7 kg)    Physical Exam  Constitutional: Patient appears well-developed and well-nourished. Obese  No distress.  HEENT: head atraumatic, normocephalic, pupils equal and reactive to light, neck supple Cardiovascular: Normal rate, regular rhythm and normal heart sounds.  No murmur heard. No BLE edema. Pulmonary/Chest: Effort normal and breath sounds normal. No respiratory distress. Abdominal: Soft.  There is no tenderness. Back: left lower back tenderness Psychiatric: Patient has a normal mood and affect. behavior is normal. Judgment and thought content normal.   Results for orders placed or performed in visit on 05/04/22  Lipid panel  Result Value Ref Range   Cholesterol 272 (H) <200 mg/dL   HDL 37 (L) > OR = 50 mg/dL   Triglycerides 275 (H) <150 mg/dL   LDL Cholesterol (Calc) 186 (H) mg/dL (calc)   Total CHOL/HDL Ratio 7.4 (H) <5.0 (calc)   Non-HDL Cholesterol (Calc) 235 (H) <  130 mg/dL (calc)  CBC with Differential/Platelet  Result Value Ref Range   WBC 9.1 3.8 - 10.8 Thousand/uL   RBC 6.89 (H) 3.80 - 5.10 Million/uL   Hemoglobin 14.4 11.7 - 15.5 g/dL   HCT 47.9 (H) 35.0 - 45.0 %   MCV 69.5 (L) 80.0 - 100.0 fL   MCH 20.9 (L) 27.0 - 33.0 pg   MCHC 30.1 (L) 32.0 - 36.0 g/dL   RDW 17.5 (H) 11.0 - 15.0 %   Platelets 314 140 - 400 Thousand/uL   MPV 10.5 7.5 - 12.5 fL   Neutro Abs 5,961 1,500 - 7,800 cells/uL   Lymphs Abs 2,275 850 - 3,900 cells/uL   Absolute Monocytes 683 200 - 950 cells/uL   Eosinophils Absolute 91 15 - 500 cells/uL   Basophils Absolute 91 0 - 200  cells/uL   Neutrophils Relative % 65.5 %   Total Lymphocyte 25.0 %   Monocytes Relative 7.5 %   Eosinophils Relative 1.0 %   Basophils Relative 1.0 %  COMPLETE METABOLIC PANEL WITH GFR  Result Value Ref Range   Glucose, Bld 88 65 - 99 mg/dL   BUN 14 7 - 25 mg/dL   Creat 1.09 (H) 0.50 - 1.05 mg/dL   eGFR 56 (L) > OR = 60 mL/min/1.36m   BUN/Creatinine Ratio 13 6 - 22 (calc)   Sodium 141 135 - 146 mmol/L   Potassium 5.0 3.5 - 5.3 mmol/L   Chloride 103 98 - 110 mmol/L   CO2 30 20 - 32 mmol/L   Calcium 10.1 8.6 - 10.4 mg/dL   Total Protein 7.3 6.1 - 8.1 g/dL   Albumin 4.5 3.6 - 5.1 g/dL   Globulin 2.8 1.9 - 3.7 g/dL (calc)   AG Ratio 1.6 1.0 - 2.5 (calc)   Total Bilirubin 0.3 0.2 - 1.2 mg/dL   Alkaline phosphatase (APISO) 64 37 - 153 U/L   AST 16 10 - 35 U/L   ALT 16 6 - 29 U/L      Assessment & Plan:   Problem List Items Addressed This Visit   None Visit Diagnoses     Sciatica of left side    -  Primary   Patient continues to have left lower back pain radiating down her left leg.  We will give injection of Kenalog and Toradol.  Referral placed to orthopedics.   Relevant Medications   triamcinolone acetonide (KENALOG-40) injection 40 mg (Start on 09/07/2022 10:15 AM)   ketorolac (TORADOL) injection 60 mg (Start on 09/07/2022 10:15 AM)   Other Relevant Orders   Ambulatory referral to Orthopedic Surgery   Need for influenza vaccination       Relevant Orders   Flu Vaccine QUAD High Dose(Fluad) (Completed)        Follow up plan: Return for Follow-up with orthopedics.

## 2022-09-07 NOTE — Telephone Encounter (Signed)
Copied from [place] [id number]. Topic: General - Other >> Sep 07, 2022 12:27 PM Ja-Kwan M wrote: Reason for CRM: Pt reports that she forgot to inform Bonnita Nasuti that she needs a Rx for a medication for bloating sent to [facility] [place] [facility] [place]

## 2022-09-08 ENCOUNTER — Other Ambulatory Visit: Payer: Self-pay | Admitting: Nurse Practitioner

## 2022-09-08 NOTE — Telephone Encounter (Signed)
Can you prescribe her something for bloating

## 2022-09-09 NOTE — Telephone Encounter (Signed)
Patient notified

## 2022-09-14 DIAGNOSIS — M545 Low back pain, unspecified: Secondary | ICD-10-CM | POA: Insufficient documentation

## 2022-09-19 ENCOUNTER — Other Ambulatory Visit: Payer: Self-pay | Admitting: Nurse Practitioner

## 2022-09-19 DIAGNOSIS — E785 Hyperlipidemia, unspecified: Secondary | ICD-10-CM

## 2022-09-20 NOTE — Telephone Encounter (Signed)
Requested Prescriptions  Pending Prescriptions Disp Refills   atorvastatin (LIPITOR) 10 MG tablet [Pharmacy Med Name: Atorvastatin Calcium 10 MG Oral Tablet] 90 tablet 0    Sig: Take 1 tablet by mouth once daily     Cardiovascular:  Antilipid - Statins Failed - 09/19/2022 12:33 PM      Failed - Lipid Panel in normal range within the last 12 months    Cholesterol  Date Value Ref Range Status  05/04/2022 272 (H) <200 mg/dL Final   LDL Cholesterol (Calc)  Date Value Ref Range Status  05/04/2022 186 (H) mg/dL (calc) Final    Comment:    Reference range: <100 . Desirable range <100 mg/dL for primary prevention;   <70 mg/dL for patients with CHD or diabetic patients  with > or = 2 CHD risk factors. Marland Kitchen LDL-C is now calculated using the Martin-Hopkins  calculation, which is a validated novel method providing  better accuracy than the Friedewald equation in the  estimation of LDL-C.  Horald Pollen et al. Lenox Ahr. 6301;601(09): 2061-2068  (http://education.[employer].com/faq/FAQ164)    HDL  Date Value Ref Range Status  05/04/2022 37 (L) > OR = 50 mg/dL Final   Triglycerides  Date Value Ref Range Status  05/04/2022 275 (H) <150 mg/dL Final    Comment:    . If a non-fasting specimen was collected, consider repeat triglyceride testing on a fasting specimen if clinically indicated.  Perry Mount et al. J. of Clin. Lipidol. 2015;9:129-169. Marland Kitchen          Passed - Patient is not pregnant      Passed - Valid encounter within last 12 months    Recent Outpatient Visits           1 week ago Sciatica of left side   [facility] Berniece Salines, FNP   1 month ago Sciatica of left side   [facility] Della Goo F, FNP   1 month ago Pain of left calf   [facility] Della Goo F, FNP   2 months ago Seasonal allergic rhinitis due to pollen   [facility] Berniece Salines, FNP   4 months ago Essential  hypertension, benign   [facility] Berniece Salines, FNP       Future Appointments             In 1 month Zane Herald, Rudolpho Sevin, FNP [facility], [facility]

## 2022-09-30 ENCOUNTER — Ambulatory Visit: Payer: Self-pay

## 2022-09-30 NOTE — Telephone Encounter (Signed)
  Chief Complaint: Muscle cramps Symptoms: above  Frequency: Last week Pertinent Negatives: Patient denies Darkened urine Disposition: [] ED /[x] Urgent Care (no appt availability in office) / [] Appointment(In office/virtual)/ []  [facility]/ [] Home Care/ [x] Refused Recommended Disposition /[] [facility]/ []  Follow-up with PCP Additional Notes: PT has restarted statin and is having muscle cramping. Pt believes this si a result of the statin. PT states she will discontinue the medication . PT refuses disposition to be seen w/in 4 hours and wants to be seen by in office.

## 2022-09-30 NOTE — Telephone Encounter (Signed)
Summary: cramps   Pt called saying she is having bad cramps in various parts of her body.  She just started back on the cholesterol medication.  She said she has been drinking pickle juice but it is not helping  CB#  [phone]     Reason for Disposition  [1] SEVERE pain AND [2] taking a statin medicine (a lipid or cholesterol lowering drug)  Answer Assessment - Initial Assessment Questions 1. ONSET: "When did the muscle aches or body pains start?"      Last week  2. LOCATION: "What part of your body is hurting?" (e.g., entire body, arms, legs)      Hands, thigh, left side 3. SEVERITY: "How bad is the pain?" (Scale 1-10; or mild, moderate, severe)   - MILD (1-3): doesn't interfere with normal activities    - MODERATE (4-7): interferes with normal activities or awakens from sleep    - SEVERE (8-10):  excruciating pain, unable to do any normal activities      4/10 - 10-10 4. CAUSE: "What do you think is causing the pains?"     Statin 5. FEVER: "Have you been having fever?"     no 6. OTHER SYMPTOMS: "Do you have any other symptoms?" (e.g., chest pain, weakness, rash, cold or flu symptoms, weight loss)     no 7. PREGNANCY: "Is there any chance you are pregnant?" "When was your last menstrual period?"     na 8. TRAVEL: "Have you traveled out of the country in the last month?" (e.g., travel history, exposures)     na  Protocols used: Muscle Aches and Body Pain-A-AH

## 2022-10-03 NOTE — Telephone Encounter (Signed)
Make her appointment when Raynelle Fanning returns

## 2022-10-03 NOTE — Telephone Encounter (Signed)
Pt already have appt sch'd with Raynelle Fanning for 11.28.2023

## 2022-10-04 ENCOUNTER — Encounter: Payer: Self-pay | Admitting: Nurse Practitioner

## 2022-10-04 ENCOUNTER — Ambulatory Visit (INDEPENDENT_AMBULATORY_CARE_PROVIDER_SITE_OTHER): Payer: Medicare Other | Admitting: Nurse Practitioner

## 2022-10-04 ENCOUNTER — Other Ambulatory Visit: Payer: Self-pay

## 2022-10-04 VITALS — BP 124/82 | Temp 98.0°F | Resp 18 | Wt 208.6 lb

## 2022-10-04 DIAGNOSIS — M791 Myalgia, unspecified site: Secondary | ICD-10-CM | POA: Diagnosis not present

## 2022-10-04 DIAGNOSIS — R252 Cramp and spasm: Secondary | ICD-10-CM

## 2022-10-04 NOTE — Progress Notes (Signed)
BP 124/82   Temp 98 F (36.7 C) (Oral)   Resp 18   Wt 208 lb 9.6 oz (94.6 kg)   BMI 38.15 kg/m    Subjective:    Patient ID: Vanessa Gross, female    DOB: 07-13-1956, 66 y.o.   MRN: [id number]  HPI: Vanessa Gross is a 66 y.o. female  Chief Complaint  Patient presents with   Muscle Pain   Myalgia:  Patient reports she has been having muscle cramps since restarting her atorvastatin.  She says that she has been having the cramping for about three weeks. She says that it is all over. She says it comes and goes.  She says that nothing she does makes it worse or better.  She says that she did stop taking the atorvastatin the day before yesterday.  She says that her muscle cramps have decreased since stopping but have not gone away completely.  Continue to hold atorvastatin for now. Will get labs.    Relevant past medical, surgical, family and social history reviewed and updated as indicated. Interim medical history since our last visit reviewed. Allergies and medications reviewed and updated.  Review of Systems  Constitutional: Negative for fever or weight change.  Respiratory: Negative for cough and shortness of breath.   Cardiovascular: Negative for chest pain or palpitations.  Gastrointestinal: Negative for abdominal pain, no bowel changes.  Musculoskeletal: Negative for gait problem or joint swelling. Positive for muscle cramps Skin: Negative for rash.  Neurological: Negative for dizziness or headache.  No other specific complaints in a complete review of systems (except as listed in HPI above).      Objective:    BP 124/82   Temp 98 F (36.7 C) (Oral)   Resp 18   Wt 208 lb 9.6 oz (94.6 kg)   BMI 38.15 kg/m   Wt Readings from Last 3 Encounters:  10/04/22 208 lb 9.6 oz (94.6 kg)  09/07/22 211 lb 12.8 oz (96.1 kg)  08/17/22 212 lb 6.4 oz (96.3 kg)    Physical Exam  Constitutional: Patient appears well-developed and well-nourished. Obese  No distress.  HEENT:  head atraumatic, normocephalic, pupils equal and reactive to light, neck supple Cardiovascular: Normal rate, regular rhythm and normal heart sounds.  No murmur heard. No BLE edema. Pulmonary/Chest: Effort normal and breath sounds normal. No respiratory distress. Abdominal: Soft.  There is no tenderness. Psychiatric: Patient has a normal mood and affect. behavior is normal. Judgment and thought content normal.   Results for orders placed or performed in visit on 05/04/22  Lipid panel  Result Value Ref Range   Cholesterol 272 (H) <200 mg/dL   HDL 37 (L) > OR = 50 mg/dL   Triglycerides 275 (H) <150 mg/dL   LDL Cholesterol (Calc) 186 (H) mg/dL (calc)   Total CHOL/HDL Ratio 7.4 (H) <5.0 (calc)   Non-HDL Cholesterol (Calc) 235 (H) <130 mg/dL (calc)  CBC with Differential/Platelet  Result Value Ref Range   WBC 9.1 3.8 - 10.8 Thousand/uL   RBC 6.89 (H) 3.80 - 5.10 Million/uL   Hemoglobin 14.4 11.7 - 15.5 g/dL   HCT 47.9 (H) 35.0 - 45.0 %   MCV 69.5 (L) 80.0 - 100.0 fL   MCH 20.9 (L) 27.0 - 33.0 pg   MCHC 30.1 (L) 32.0 - 36.0 g/dL   RDW 17.5 (H) 11.0 - 15.0 %   Platelets 314 140 - 400 Thousand/uL   MPV 10.5 7.5 - 12.5 fL   Neutro Abs 5,961  1,500 - 7,800 cells/uL   Lymphs Abs 2,275 850 - 3,900 cells/uL   Absolute Monocytes 683 200 - 950 cells/uL   Eosinophils Absolute 91 15 - 500 cells/uL   Basophils Absolute 91 0 - 200 cells/uL   Neutrophils Relative % 65.5 %   Total Lymphocyte 25.0 %   Monocytes Relative 7.5 %   Eosinophils Relative 1.0 %   Basophils Relative 1.0 %  COMPLETE METABOLIC PANEL WITH GFR  Result Value Ref Range   Glucose, Bld 88 65 - 99 mg/dL   BUN 14 7 - 25 mg/dL   Creat 1.09 (H) 0.50 - 1.05 mg/dL   eGFR 56 (L) > OR = 60 mL/min/1.72m   BUN/Creatinine Ratio 13 6 - 22 (calc)   Sodium 141 135 - 146 mmol/L   Potassium 5.0 3.5 - 5.3 mmol/L   Chloride 103 98 - 110 mmol/L   CO2 30 20 - 32 mmol/L   Calcium 10.1 8.6 - 10.4 mg/dL   Total Protein 7.3 6.1 - 8.1 g/dL    Albumin 4.5 3.6 - 5.1 g/dL   Globulin 2.8 1.9 - 3.7 g/dL (calc)   AG Ratio 1.6 1.0 - 2.5 (calc)   Total Bilirubin 0.3 0.2 - 1.2 mg/dL   Alkaline phosphatase (APISO) 64 37 - 153 U/L   AST 16 10 - 35 U/L   ALT 16 6 - 29 U/L      Assessment & Plan:   Problem List Items Addressed This Visit   None Visit Diagnoses     Myalgia    -  Primary   hold atorvastatin for now, get labs.   Relevant Orders   CK (Creatine Kinase)   COMPLETE METABOLIC PANEL WITH GFR   Magnesium   Muscle cramps       hold atorvastatin for now, get labs   Relevant Orders   CK (Creatine Kinase)   COMPLETE METABOLIC PANEL WITH GFR   Magnesium        Follow up plan: Return if symptoms worsen or fail to improve.

## 2022-10-05 LAB — COMPLETE METABOLIC PANEL WITH GFR
AG Ratio: 1.5 (calc) (ref 1.0–2.5)
ALT: 13 U/L (ref 6–29)
AST: 16 U/L (ref 10–35)
Albumin: 4.2 g/dL (ref 3.6–5.1)
Alkaline phosphatase (APISO): 59 U/L (ref 37–153)
BUN/Creatinine Ratio: 14 (calc) (ref 6–22)
BUN: 15 mg/dL (ref 7–25)
CO2: 29 mmol/L (ref 20–32)
Calcium: 9.5 mg/dL (ref 8.6–10.4)
Chloride: 106 mmol/L (ref 98–110)
Creat: 1.07 mg/dL — ABNORMAL HIGH (ref 0.50–1.05)
Globulin: 2.8 g/dL (calc) (ref 1.9–3.7)
Glucose, Bld: 81 mg/dL (ref 65–99)
Potassium: 5.3 mmol/L (ref 3.5–5.3)
Sodium: 144 mmol/L (ref 135–146)
Total Bilirubin: 0.3 mg/dL (ref 0.2–1.2)
Total Protein: 7 g/dL (ref 6.1–8.1)
eGFR: 58 mL/min/{1.73_m2} — ABNORMAL LOW (ref >=60)

## 2022-10-05 LAB — CK: Total CK: 126 U/L (ref 29–143)

## 2022-10-05 LAB — MAGNESIUM: Magnesium: 2.2 mg/dL (ref 1.5–2.5)

## 2022-11-01 ENCOUNTER — Ambulatory Visit (INDEPENDENT_AMBULATORY_CARE_PROVIDER_SITE_OTHER): Payer: Medicare Other

## 2022-11-01 DIAGNOSIS — Z13820 Encounter for screening for osteoporosis: Secondary | ICD-10-CM

## 2022-11-01 DIAGNOSIS — Z0000 Encounter for general adult medical examination without abnormal findings: Secondary | ICD-10-CM | POA: Diagnosis not present

## 2022-11-01 NOTE — Progress Notes (Signed)
Subjective:  I connected with  Vanessa Gross on 11/01/22 by a audio enabled telemedicine application and verified that I am speaking with the correct person using two identifiers.  Patient Location: Home  Provider Location: Office/Clinic  I discussed the limitations of evaluation and management by telemedicine. The patient expressed understanding and agreed to proceed.  Vanessa Gross is a 66 y.o. female who presents for Medicare Annual (Subsequent) preventive examination.  Review of Systems    [place] to PCP        Objective:    There were no vitals filed for this visit. There is no height or weight on file to calculate BMI.     05/16/2017    3:06 PM 02/24/2017    8:46 AM 12/20/2016    3:30 PM 11/17/2016   10:32 AM 08/04/2016    3:13 PM 06/07/2016    3:31 PM 10/19/2015    3:28 PM  Advanced Directives  Does Patient Have a Medical Advance Directive? No No No No No No No  Would patient like information on creating a medical advance directive?  No - Patient declined   No - patient declined information No - patient declined information No - patient declined information    Current Medications (verified) Outpatient Encounter Medications as of 11/01/2022  Medication Sig   aspirin EC 81 MG tablet Take 1 tablet (81 mg total) by mouth daily.   atorvastatin (LIPITOR) 10 MG tablet Take 1 tablet by mouth once daily   Azelastine HCl 137 MCG/SPRAY SOLN USE 2 SPRAY(S) IN EACH NOSTRIL TWICE DAILY AS DIRECTED   loratadine (CLARITIN) 10 MG tablet Take 1 tablet (10 mg total) by mouth daily.   methocarbamol (ROBAXIN) 500 MG tablet Take 1 tablet (500 mg total) by mouth 2 (two) times daily as needed for muscle spasms.   Multiple Vitamin (MULTI-VITAMINS) TABS Take by mouth.   olmesartan (BENICAR) 20 MG tablet Take 1 tablet (20 mg total) by mouth daily.   triamterene-hydrochlorothiazide (MAXZIDE-25) 37.5-25 MG tablet Take 1 tablet by mouth daily.   No facility-administered encounter medications on  file as of 11/01/2022.    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    Past Surgical History:  Procedure Laterality Date   ABDOMINAL HYSTERECTOMY     BREAST EXCISIONAL BIOPSY Left    benign   TONSILLECTOMY AND ADENOIDECTOMY     Family History  Problem Relation Age of Onset   Hypertension Mother    Cancer Mother        lpymphoma   Stroke Maternal Grandmother    Stroke Maternal Grandfather    Breast cancer Cousin        maternal   Social History   Socioeconomic History   Marital status: Married    Spouse name: Gerri Spore    Number of children: 3   Years of education: 10   Highest education level: GED or equivalent  Occupational History   Not on file  Tobacco Use   Smoking status: Never   Smokeless tobacco: Never  Vaping Use   Vaping Use: Never used  Substance and Sexual Activity   Alcohol use: No    Alcohol/week: 0.0 standard drinks of alcohol   Drug use: No   Sexual activity: Not Currently  Other Topics Concern   Not on file  Social History Narrative   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, plant shut down    Social Determinants of Health   Financial Resource Strain: Medium Risk (10/25/2018)   Overall Financial Resource Strain (CARDIA)    Difficulty of Paying Living Expenses: Somewhat hard  Food Insecurity: No Food Insecurity (04/14/2021)   Hunger Vital Sign    Worried About Running Out of Food in the Last Year: Never true    Ran Out of Food in the Last Year: Never  true  Transportation Needs: No Transportation Needs (04/14/2021)   PRAPARE - Administrator, Civil Service (Medical): No    Lack of Transportation (Non-Medical): No  Physical Activity: Insufficiently Active (10/25/2018)   Exercise Vital Sign    Days of Exercise per Week: 7 days    Minutes of Exercise per Session: 10 min  Stress: Stress Concern Present (04/14/2021)   [employer] of Occupational Health - Occupational Stress Questionnaire    Feeling of Stress : Rather much  Social Connections: Socially Integrated (04/14/2021)   Social Connection and Isolation Panel [NHANES]    Frequency of Communication with Friends and Family: More than three times a week    Frequency of Social Gatherings with Friends and Family: Once a week    Attends Religious Services: 1 to 4 times per year    Active Member of [employer] or Organizations: Yes    Attends Banker Meetings: 1 to 4 times per year    Marital Status: Married    Tobacco Counseling Counseling given: Not Answered   Clinical Intake:                 Diabetic?No         Activities of Daily Living    10/04/2022    1:48 PM 09/07/2022    9:47 AM  In your present state of health, do you have any difficulty performing the following activities:  Hearing? 0 0  Vision? 0 0  Difficulty concentrating or making decisions? 0 0  Walking or climbing stairs? 0 1  Dressing or bathing? 0 0  Doing errands, shopping? 0 0    Patient Care Team: Berniece Salines, FNP as PCP - General (Nurse Practitioner) Jim Like, RN as Registered Nurse Scarlett Presto, RN (Inactive) as Registered Nurse [place], Clent Jacks, LCSW as Social Worker  Indicate any recent Medical Services you may have received from other than [facility] providers in the past year (date may be approximate).     Assessment:   This is a routine wellness examination for [facility].  Hearing/Vision screen No results found.  Dietary issues and exercise  activities discussed:     Goals Addressed   None   Depression Screen    10/04/2022    1:49 PM 09/07/2022    9:47 AM 08/17/2022    9:52 AM 08/08/2022    8:23 AM 07/19/2022   10:22 AM 05/04/2022   11:03 AM 11/03/2021    2:05 PM  PHQ 2/9 Scores  PHQ - 2 Score 0 0 0 0 0 0 0  PHQ- 9 Score       0  Fall Risk    10/04/2022    1:48 PM 09/07/2022    9:47 AM 08/17/2022    9:52 AM 08/08/2022    8:23 AM 07/19/2022   10:22 AM  Fall Risk   Falls in the past year? 1 1 0 1 0  Number falls in past yr: 1 0 0 0 0  Injury with Fall? 1 1 0 1 0  Risk for fall due to : History of fall(s) History of fall(s)  History of fall(s)   Follow up Falls evaluation completed Falls evaluation completed Falls evaluation completed Falls evaluation completed Falls evaluation completed    FALL RISK PREVENTION PERTAINING TO THE HOME:  Any stairs in or around the home? No  If so, are there any without handrails? No  Home free of loose throw rugs in walkways, pet beds, electrical cords, etc? Yes  Adequate lighting in your home to reduce risk of falls? Yes   ASSISTIVE DEVICES UTILIZED TO PREVENT FALLS:  Life alert? No  Use of a cane, walker or w/c? No  Grab bars in the bathroom? Yes  Shower chair or bench in shower? No  Elevated toilet seat or a handicapped toilet? Yes   TIMED UP AND GO:  Was the test performed? No .  Length of time to ambulate 10 feet: n/a sec.     Cognitive Function:        Immunizations Immunization History  Administered Date(s) Administered   Fluad Quad(high Dose 65+) 09/07/2022   Influenza,inj,Quad PF,6+ Mos 11/04/2016, 07/20/2018, 07/24/2020   PFIZER(Purple Top)SARS-COV-2 Vaccination 01/09/2020, 01/30/2020   Tdap 05/12/2022    TDAP status: Up to date  Flu Vaccine status: Up to date  Pneumococcal vaccine status: Declined,  Education has been provided regarding the importance of this vaccine but patient still declined. Advised may receive this vaccine at local  pharmacy or Health Dept. Aware to provide a copy of the vaccination record if obtained from local pharmacy or Health Dept. Verbalized acceptance and understanding.   Covid-19 vaccine status: Completed vaccines  Qualifies for Shingles Vaccine? Yes   Zostavax completed No   Shingrix Completed?: No.    Education has been provided regarding the importance of this vaccine. Patient has been advised to call insurance company to determine out of pocket expense if they have not yet received this vaccine. Advised may also receive vaccine at local pharmacy or Health Dept. Verbalized acceptance and understanding.  Screening Tests Health Maintenance  Topic Date Due   Zoster Vaccines- Shingrix (1 of 2) Never done   MAMMOGRAM  04/01/2021   COVID-19 Vaccine (3 - 2023-24 season) 07/08/2022   Pneumonia Vaccine 8165+ Years old (1 - PCV) 05/05/2023 (Originally 02/02/2022)   DEXA SCAN  05/05/2023 (Originally 02/02/2022)   COLONOSCOPY (Pts 45-7575yrs Insurance coverage will need to be confirmed)  09/08/2023 (Originally 06/07/2021)   Medicare Annual Wellness (AWV)  11/02/2023   DTaP/Tdap/Td (2 - Td or Tdap) 05/12/2032   INFLUENZA VACCINE  Completed   Hepatitis C Screening  Completed   HIV Screening  Completed   HPV VACCINES  Aged Out   PAP SMEAR-Modifier  Discontinued    Health Maintenance  Health Maintenance Due  Topic Date Due   Zoster Vaccines- Shingrix (1 of 2) Never done   MAMMOGRAM  04/01/2021   COVID-19 Vaccine (3 - 2023-24 season) 07/08/2022    Colorectal cancer screening: Type of screening: Colonoscopy. Completed 06/08/2011. Repeat every 10 years  Mammogram status: Completed 04/01/2020. Repeat every year  Bone Density status: Ordered  10/22/2022. Pt provided with contact info and advised to call to schedule appt.  Lung Cancer Screening: (Low Dose CT Chest recommended if Age 6-80 years, 30 pack-year currently smoking OR have quit w/in 15years.) does not qualify.   Lung Cancer Screening Referral:  n/a  Additional Screening:  Hepatitis C Screening: does not qualify; Completed 06/24/2016  Vision Screening: Recommended annual ophthalmology exams for early detection of glaucoma and other disorders of the eye. Is the patient up to date with their annual eye exam?  Yes  Who is the provider or what is the name of the office in which the patient attends annual eye exams? Dr. Alvester Morin If pt is not established with a provider, would they like to be referred to a provider to establish care? No .   Dental Screening: Recommended annual dental exams for proper oral hygiene  Community Resource Referral / Chronic Care Management: CRR required this visit?  No   CCM required this visit?  No      Plan:     I have personally reviewed and noted the following in the patient's chart:   Medical and social history Use of alcohol, tobacco or illicit drugs  Current medications and supplements including opioid prescriptions. Patient is not currently taking opioid prescriptions. Functional ability and status Nutritional status Physical activity Advanced directives List of other physicians Hospitalizations, surgeries, and ER visits in previous 12 months Vitals Screenings to include cognitive, depression, and falls Referrals and appointments  In addition, I have reviewed and discussed with patient certain preventive protocols, quality metrics, and best practice recommendations. A written personalized care plan for preventive services as well as general preventive health recommendations were provided to patient.     Benay Pike, CMA   11/01/2022   Nurse Notes:  Ms. Charbonnet , Thank you for taking time to come for your Medicare Wellness Visit. I appreciate your ongoing commitment to your health goals. Please review the following plan we discussed and let me know if I can assist you in the future.   These are the goals we discussed:  Goals      Find Help in My Community     Timeframe:   Short-Term Goal Priority:  High Start Date:       04/14/21                      Expected End Date:   05/14/21                    Follow Up Date 05/07/21   - follow-up on any referrals for help I am given - think ahead to make sure my need does not become an emergency - have a back-up plan  -continue to follow up with insurance agent regarding insurance coverage and options   Why is this important?   Knowing how and where to find help for yourself or family in your neighborhood and community is an important skill.  You will want to take some steps to learn how.    Notes:         This is a list of the screening recommended for you and due dates:  Health Maintenance  Topic Date Due   Zoster (Shingles) Vaccine (1 of 2) Never done   Mammogram  04/01/2021   COVID-19 Vaccine (3 - 2023-24 season) 07/08/2022   Pneumonia Vaccine (1 - PCV) 05/05/2023*   DEXA scan (bone density measurement)  05/05/2023*   Colon Cancer Screening  09/08/2023*   Medicare Annual Wellness Visit  11/02/2023   DTaP/Tdap/Td vaccine (2 - Td or Tdap) 05/12/2032   Flu Shot  Completed   Hepatitis C Screening: USPSTF Recommendation to screen - Ages 18-79 yo.  Completed   HIV Screening  Completed   HPV Vaccine  Aged Out   Pap Smear  Discontinued  *Topic was postponed. The date shown is not the original due date.

## 2022-11-02 NOTE — Progress Notes (Unsigned)
There were no vitals taken for this visit.   Subjective:    Patient ID: Vanessa Gross, female    DOB: 09/22/1956, 66 y.o.   MRN: [id number]  HPI: Vanessa Gross is a 66 y.o. female  No chief complaint on file.  HTN: patient is currently taking olmesartan 25 mg daily and triamterene-hydrochlorothiazide 37.5-25 mg daily. Her blood pressure today is ***.  She denies any chest pain, shortness of breath, headaches or blurred vision.   Hyperlipidemia: patient was started on atorvastatin patient experienced myalgia and stopped taking the medication.  Her last LDL was 186 on 05/04/2022.   The 10-year ASCVD risk score (Arnett DK, et al., 2019) is: 12.2%   Values used to calculate the score:     Age: 27 years     Sex: Female     Is Non-Hispanic African American: Yes     Diabetic: No     Tobacco smoker: No     Systolic Blood Pressure: 622 mmHg     Is BP treated: Yes     HDL Cholesterol: 37 mg/dL     Total Cholesterol: 272 mg/dL   Allergic rhinitis:patient currently uses Claritin and azelastine nasal spray for her allergies.  She says this helps.   Relevant past medical, surgical, family and social history reviewed and updated as indicated. Interim medical history since our last visit reviewed. Allergies and medications reviewed and updated.  Review of Systems  Constitutional: Negative for fever or weight change.  Respiratory: Negative for cough and shortness of breath.   Cardiovascular: Negative for chest pain or palpitations.  Gastrointestinal: Negative for abdominal pain, no bowel changes.  Musculoskeletal: Negative for gait problem or joint swelling.  Positive for right-sided neck pain right axilla pain and bilateral hip pain Skin: Negative for rash.  Neurological: Negative for dizziness or headache.  No other specific complaints in a complete review of systems (except as listed in HPI above).      Objective:    There were no vitals taken for this visit.  Wt Readings from  Last 3 Encounters:  10/04/22 208 lb 9.6 oz (94.6 kg)  09/07/22 211 lb 12.8 oz (96.1 kg)  08/17/22 212 lb 6.4 oz (96.3 kg)    Physical Exam  Constitutional: Patient appears well-developed and well-nourished. Obese  No distress.  HEENT: head atraumatic, normocephalic, pupils equal and reactive to light, neck supple Cardiovascular: Normal rate, regular rhythm and normal heart sounds.  No murmur heard. No BLE edema. Pulmonary/Chest: Effort normal and breath sounds normal. No respiratory distress. Abdominal: Soft.  There is no tenderness. MSK: tenderness to right side neck, no cervical tenderness, patient has full range of motion of all extremities. Pelvis stable.  Psychiatric: Patient has a normal mood and affect. behavior is normal. Judgment and thought content normal.  Results for orders placed or performed in visit on 10/04/22  CK (Creatine Kinase)  Result Value Ref Range   Total CK 126 29 - 143 U/L  COMPLETE METABOLIC PANEL WITH GFR  Result Value Ref Range   Glucose, Bld 81 65 - 99 mg/dL   BUN 15 7 - 25 mg/dL   Creat 1.07 (H) 0.50 - 1.05 mg/dL   eGFR 58 (L) > OR = 60 mL/min/1.63m   BUN/Creatinine Ratio 14 6 - 22 (calc)   Sodium 144 135 - 146 mmol/L   Potassium 5.3 3.5 - 5.3 mmol/L   Chloride 106 98 - 110 mmol/L   CO2 29 20 - 32 mmol/L  Calcium 9.5 8.6 - 10.4 mg/dL   Total Protein 7.0 6.1 - 8.1 g/dL   Albumin 4.2 3.6 - 5.1 g/dL   Globulin 2.8 1.9 - 3.7 g/dL (calc)   AG Ratio 1.5 1.0 - 2.5 (calc)   Total Bilirubin 0.3 0.2 - 1.2 mg/dL   Alkaline phosphatase (APISO) 59 37 - 153 U/L   AST 16 10 - 35 U/L   ALT 13 6 - 29 U/L  Magnesium  Result Value Ref Range   Magnesium 2.2 1.5 - 2.5 mg/dL      Assessment & Plan:   Problem List Items Addressed This Visit   None    Follow up plan: No follow-ups on file.

## 2022-11-03 ENCOUNTER — Encounter: Payer: Self-pay | Admitting: Nurse Practitioner

## 2022-11-03 ENCOUNTER — Ambulatory Visit (INDEPENDENT_AMBULATORY_CARE_PROVIDER_SITE_OTHER): Payer: Medicare Other | Admitting: Nurse Practitioner

## 2022-11-03 VITALS — BP 132/74 | HR 96 | Temp 98.0°F | Resp 18 | Ht 62.0 in | Wt 207.3 lb

## 2022-11-03 DIAGNOSIS — E785 Hyperlipidemia, unspecified: Secondary | ICD-10-CM | POA: Diagnosis not present

## 2022-11-03 DIAGNOSIS — I10 Essential (primary) hypertension: Secondary | ICD-10-CM

## 2022-11-03 DIAGNOSIS — J301 Allergic rhinitis due to pollen: Secondary | ICD-10-CM | POA: Diagnosis not present

## 2022-11-03 DIAGNOSIS — Z6836 Body mass index (BMI) 36.0-36.9, adult: Secondary | ICD-10-CM

## 2022-11-03 DIAGNOSIS — E66812 Morbid (severe) obesity due to excess calories: Secondary | ICD-10-CM

## 2022-11-03 MED ORDER — LORATADINE 10 MG PO TABS: 10.0000 mg | ORAL_TABLET | Freq: Every day | ORAL | 3 refills | Status: DC

## 2022-11-03 MED ORDER — OLMESARTAN MEDOXOMIL 20 MG PO TABS: 20.0000 mg | ORAL_TABLET | Freq: Every day | ORAL | 3 refills | Status: DC

## 2022-11-03 MED ORDER — TRIAMTERENE-HCTZ 37.5-25 MG PO TABS: 1.0000 | ORAL_TABLET | Freq: Every day | ORAL | 3 refills | Status: DC

## 2022-11-03 NOTE — Assessment & Plan Note (Signed)
Continue taking olmesartan 25 mg daily and triamterene-hydrochlorothiazide 37.5-25 mg daily.

## 2022-11-03 NOTE — Assessment & Plan Note (Signed)
Patient did not tolerate statin and declines taking another medication for her cholesterol.

## 2022-11-03 NOTE — Assessment & Plan Note (Signed)
Work on increasing physical activity as tolerated, eat well balanced with portion control 

## 2022-11-08 ENCOUNTER — Ambulatory Visit: Payer: Self-pay

## 2022-11-08 NOTE — Telephone Encounter (Signed)
  Chief Complaint: Sinus pressure, drainage Symptoms: above Frequency: Today Pertinent Negatives: Patient denies Fever, SOB, cough Disposition: [] ED /[] Urgent Care (no appt availability in office) / [] Appointment(In office/virtual)/ []  [facility]/ [] Home Care/ [] Refused Recommended Disposition /[] [facility]/ [x]  Follow-up with PCP Additional Notes: Pt states that she often gets sinus congestion and pain.  Symptoms started today. Pt would like something to help with sinus congestion. Pt states that when she gets this it gets very bad very quickly.  Pt is requesting a good medication that will help her. She states that medication has been called in previously for this.  Please advise.      Summary: Nasal drainage   Patient states that she has had nasal drainage for 2 days. Patient is requesting a medication to control symptoms.     Reason for Disposition  [1] Sinus congestion as part of a cold AND [2] present < 10 days  Answer Assessment - Initial Assessment Questions 1. LOCATION: "Where does it hurt?"      Behind the eyes 2. ONSET: "When did the sinus pain start?"  (e.g., hours, days)      today 3. SEVERITY: "How bad is the pain?"   (Scale 1-10; mild, moderate or severe)   - MILD (1-3): doesn't interfere with normal activities    - MODERATE (4-7): interferes with normal activities (e.g., work or school) or awakens from sleep   - SEVERE (8-10): excruciating pain and patient unable to do any normal activities        5/10 4. RECURRENT SYMPTOM: "Have you ever had sinus problems before?" If Yes, ask: "When was the last time?" and "What happened that time?"      Yes 5. NASAL CONGESTION: "Is the nose blocked?" If Yes, ask: "Can you open it or must you breathe through your mouth?"     No can breathe her nose 6. NASAL DISCHARGE: "Do you have discharge from your nose?" If so ask, "What color?"     clear 7. FEVER: "Do you have a fever?" If Yes, ask: "What is it, how  was it measured, and when did it start?"      no 8. OTHER SYMPTOMS: "Do you have any other symptoms?" (e.g., sore throat, cough, earache, difficulty breathing)     Pain behind eyes 9. PREGNANCY: "Is there any chance you are pregnant?" "When was your last menstrual period?"  Protocols used: Sinus Pain or Congestion-A-AH

## 2022-11-09 ENCOUNTER — Telehealth (INDEPENDENT_AMBULATORY_CARE_PROVIDER_SITE_OTHER): Payer: Medicare Other | Admitting: Nurse Practitioner

## 2022-11-09 ENCOUNTER — Other Ambulatory Visit: Payer: Self-pay

## 2022-11-09 DIAGNOSIS — J069 Acute upper respiratory infection, unspecified: Secondary | ICD-10-CM

## 2022-11-09 DIAGNOSIS — J0140 Acute pansinusitis, unspecified: Secondary | ICD-10-CM | POA: Diagnosis not present

## 2022-11-09 MED ORDER — PREDNISONE 10 MG (21) PO TBPK: ORAL_TABLET | ORAL | 0 refills | Status: DC

## 2022-11-09 MED ORDER — PROMETHAZINE-DM 6.25-15 MG/5ML PO SYRP: 5.0000 mL | ORAL_SOLUTION | Freq: Four times a day (QID) | ORAL | 0 refills | Status: DC | PRN

## 2022-11-09 NOTE — Telephone Encounter (Signed)
Summary: Nasal drainage   Office called pt to make her appt. Pt was not happy about that.  Pt scheduled for 2:20 pm today.  Pt would like the dr to go ahead and call her something to the pharmacy. And would like the nurse to call.          Attempted to reach pt, extended ring, unable to leave VM to call back.

## 2022-11-09 NOTE — Progress Notes (Signed)
Name: Vanessa Gross   MRN: [id number]    DOB: 03/12/1956   Date:11/09/2022       Progress Note  Subjective  Chief Complaint  Chief Complaint  Patient presents with   Sinusitis    Cough, congested, facial pressure, runny nose for 2 days    I connected with  Timmie Foerster Minch  on 11/09/22 at 11:40 am by a video enabled telemedicine application and verified that I am speaking with the correct person using two identifiers.  I discussed the limitations of evaluation and management by telemedicine and the availability of in person appointments. The patient expressed understanding and agreed to proceed with a virtual visit  Staff also discussed with the patient that there may be a patient responsible charge related to this service. Patient Location: home Provider Location: [facility] Additional Individuals present: alone  HPI  URI:  patient reports she has had a cough, congestion, facial pressure and runny nose for two days.  Patient reports she has taken Claritin and azelastine spray.  She denies any shortness of breath or fever.  Discussed with patient that this is likely viral illness. Patient reports she wants cough syrup.  Will send in phenergan -DM will also send in steroid taper.  Patient declined testing. If no improvement in symptoms in about a week will send in antibiotic.   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 05/25/2007    Social History   Tobacco Use   Smoking status: Never   Smokeless tobacco: Never   Substance Use Topics   Alcohol use: No    Alcohol/week: 0.0 standard drinks of alcohol     Current Outpatient Medications:    aspirin EC 81 MG tablet, Take 1 tablet (81 mg total) by mouth daily., Disp: , Rfl:    Azelastine HCl 137 MCG/SPRAY SOLN, USE 2 SPRAY(S) IN EACH NOSTRIL TWICE DAILY AS DIRECTED, Disp: 30 mL, Rfl: 3   loratadine (CLARITIN) 10 MG tablet, Take 1 tablet (10 mg total) by mouth daily., Disp: 90 tablet, Rfl: 3   methocarbamol (ROBAXIN) 500 MG tablet, Take 1 tablet (500 mg total) by mouth 2 (two) times daily as needed for muscle spasms., Disp: 20 tablet, Rfl: 0   Multiple Vitamin (MULTI-VITAMINS) TABS, Take by mouth., Disp: , Rfl:    olmesartan (BENICAR) 20 MG tablet, Take 1 tablet (20 mg total) by mouth daily., Disp: 90 tablet, Rfl: 3   triamterene-hydrochlorothiazide (MAXZIDE-25) 37.5-25 MG tablet, Take 1 tablet by mouth daily., Disp: 90 tablet, Rfl: 3  Allergies  Allergen Reactions   Celecoxib    Gabapentin     abnormal dreams    I personally reviewed active problem list, medication list, allergies with the patient/caregiver today.  ROS  Constitutional: Negative for fever or weight change.  HEENT: positive for nasal congestion, facial pressure and runny nose Respiratory: positive for cough and negative for  shortness of breath.   Cardiovascular: Negative for chest pain or palpitations.  Gastrointestinal: Negative for abdominal pain, no bowel changes.  Musculoskeletal: Negative for gait  problem or joint swelling.  Skin: Negative for rash.  Neurological: Negative for dizziness or headache.  No other specific complaints in a complete review of systems (except as listed in HPI above).   Objective  Virtual encounter, vitals not obtained.  There is no height or weight on file to calculate BMI.  Nursing Note and Vital Signs reviewed.  Physical Exam  Awake, alert and oriented, speaking in complete sentences  No results found for this or any previous visit  (from the past 72 hour(s)).  Assessment & Plan  Problem List Items Addressed This Visit   None Visit Diagnoses     Viral upper respiratory tract infection    -  Primary   will send in phenergan DM, and steroid taper, push fluids, continue claritin and azelastine nasal spray.  can also take mucinex   Relevant Medications   promethazine-dextromethorphan (PROMETHAZINE-DM) 6.25-15 MG/5ML syrup   Acute non-recurrent pansinusitis       will send in phenergan DM, and steroid taper, push fluids, continue claritin and azelastine nasal spray. can also take mucinex   Relevant Medications   promethazine-dextromethorphan (PROMETHAZINE-DM) 6.25-15 MG/5ML syrup   predniSONE (STERAPRED UNI-PAK 21 TAB) 10 MG (21) TBPK tablet        -Red flags and when to present for emergency care or RTC including fever >101.66F, chest pain, shortness of breath, new/worsening/un-resolving symptoms,  reviewed with patient at time of visit. Follow up and care instructions discussed and provided in AVS. - I discussed the assessment and treatment plan with the patient. The patient was provided an opportunity to ask questions and all were answered. The patient agreed with the plan and demonstrated an understanding of the instructions.  I provided 15 minutes of non-face-to-face time during this encounter.  Bo Merino, FNP

## 2022-11-09 NOTE — Telephone Encounter (Signed)
Pt states she wants something called in for sinus problem. Angry affect regarding appt secured for this afternoon. States "Just  want my money.Don't need to come in, I know what this is, it's my sinus' and I want something called in that will help before it gets worse.." States H/O. Sinus drainage is clear "For now" Afebrile.  Please advise.

## 2022-11-09 NOTE — Telephone Encounter (Signed)
Tried calling the patient at 7:33 on 11-09-2022 to get patient appt and said that mb full.

## 2023-01-06 ENCOUNTER — Ambulatory Visit: Payer: Self-pay | Admitting: *Deleted

## 2023-01-06 ENCOUNTER — Telehealth: Payer: Self-pay

## 2023-01-06 NOTE — Telephone Encounter (Signed)
Copied from [place] [phone]. Topic: General - Call Back - No Documentation >> Jan 06, 2023  1:09 PM Ja-Kwan M wrote: Reason for CRM: Pt stated she had a missed call from the office so she was returning the call.   Received telephone encounter and sent to PCP for advisement.

## 2023-01-06 NOTE — Telephone Encounter (Signed)
Message from Luciana Axe sent at 01/06/2023 11:09 AM EST  Summary: Prenatal vitamin advice   Pt is calling to ask can she take prenatal vitamins for energy? Pt has friend the same age that are not pregnant and they are satisfied with the results of prenatal vitamins. Please advise          Call History   Type Contact Phone/Fax User  01/06/2023 11:08 AM EST Phone (Incoming) Purvi, Ridgell M (Self) [phone] Jerilynn Mages) Blase Mess A   Reason for Disposition  [1] Caller requesting NON-URGENT health information AND [2] PCP's office is the best resource  Answer Assessment - Initial Assessment Questions 1. REASON FOR CALL or QUESTION: "What is your reason for calling today?" or "How can I best help you?" or "What question do you have that I can help answer?"     She is wanting to take prenatal vitamins because her friend takes them and they given her energy.   Her friend is not pregnant or trying to get pregnant but says the prenatal vitamins give her energy. Can I take prenatal vitamins without any harm?  Protocols used: Information Only Call - No Triage-A-AH

## 2023-01-06 NOTE — Telephone Encounter (Signed)
  Chief Complaint: She wants to know if it's ok for her to take prenatal vitamins even though she is not pregnant or trying to get pregnant.   Her friend told her she takes them and they give her energy.   Friend is not pregnant or trying to get pregnant either. Symptoms: No symptoms.   Just wanted to know if it was ok to take prenatal vitamins. Frequency: N/A Pertinent Negatives: Patient denies N/A Disposition: '[]'$ ED /'[]'$ Urgent Care (no appt availability in office) / '[]'$ Appointment(In office/virtual)/ '[]'$  [place] [facility]/ '[]'$ Home Care/ '[]'$ Refused Recommended Disposition /'[]'$ [place] [facility]/ '[x]'$  Follow-up with PCP Additional Notes: Question sent to Serafina Royals, FNP.    Pt. Agreeable to someone calling her back. I let her know the provider wasn't in today and it would be one day next week before someone would be able to answer her question.   She was fine with this.

## 2023-01-09 ENCOUNTER — Telehealth: Payer: Self-pay | Admitting: Nurse Practitioner

## 2023-01-09 NOTE — Telephone Encounter (Signed)
Left message for patient

## 2023-01-09 NOTE — Telephone Encounter (Signed)
Copied from [place] [phone]. Topic: General - Other >> Jan 09, 2023  2:31 PM Dominique A wrote: Reason for CRM: Pt states that she missed a phone call from [place] last week and she is wanting Bonnita Nasuti to call her back.  Please advise

## 2023-01-09 NOTE — Telephone Encounter (Signed)
Copied from [place] [id number]. Topic: General - Other >> Jan 09, 2023 10:15 AM Vanessa Gross wrote: Reason for CRM: patient called in just spoke with cma at office and wants to know cna she take  momeprazole

## 2023-01-09 NOTE — Telephone Encounter (Signed)
Patient currently taking Olmesartan and triameterne/hctz for fluid and blood pressure. Do she still suppose to be still taking this also

## 2023-01-09 NOTE — Telephone Encounter (Signed)
The 2 meds she is taking is not helping with her fluid

## 2023-01-10 NOTE — Telephone Encounter (Signed)
Spoke to patient and she will make appointment to come in. Not sure if its inflammation or fluid on left leg. Had xray in [place] for this

## 2023-01-11 NOTE — Telephone Encounter (Signed)
Pt called in requesting to speak with Ms. Vanessa Gross, declined to provide further details, stated its about me and my husband.  Please advise.

## 2023-02-10 ENCOUNTER — Ambulatory Visit
Admission: RE | Admit: 2023-02-10 | Discharge: 2023-02-10 | Disposition: A | Discharge status: Home / Self Care | Payer: Medicare Other | Source: Ambulatory Visit | Attending: Nurse Practitioner | Admitting: Nurse Practitioner

## 2023-02-10 DIAGNOSIS — Z1231 Encounter for screening mammogram for malignant neoplasm of breast: Secondary | ICD-10-CM | POA: Insufficient documentation

## 2023-05-09 ENCOUNTER — Other Ambulatory Visit: Payer: Self-pay | Admitting: Nurse Practitioner

## 2023-05-09 DIAGNOSIS — I10 Essential (primary) hypertension: Secondary | ICD-10-CM

## 2023-05-10 NOTE — Telephone Encounter (Signed)
Unable to refill per protocol, Rx request is too soon. Last refill 11/03/22 for 90 and 3 refills.  Requested Prescriptions  Pending Prescriptions Disp Refills   triamterene-hydrochlorothiazide (MAXZIDE-25) 37.5-25 MG tablet [Pharmacy Med Name: Triamterene-HCTZ 37.5-25 MG Oral Tablet] 90 tablet 0    Sig: Take 1 tablet by mouth once daily     Cardiovascular: Diuretic Combos Failed - 05/09/2023  6:06 PM      Failed - K in normal range and within 180 days    Potassium  Date Value Ref Range Status  10/04/2022 5.3 3.5 - 5.3 mmol/L Final         Failed - Na in normal range and within 180 days    Sodium  Date Value Ref Range Status  10/04/2022 144 135 - 146 mmol/L Final         Failed - Cr in normal range and within 180 days    Creat  Date Value Ref Range Status  10/04/2022 1.07 (H) 0.50 - 1.05 mg/dL Final         Failed - Valid encounter within last 6 months    Recent Outpatient Visits           6 months ago Viral upper respiratory tract infection   [facility] Berniece Salines, FNP   6 months ago Essential hypertension, benign   [facility] Berniece Salines, FNP   7 months ago Myalgia   [facility] Berniece Salines, FNP   8 months ago Sciatica of left side   [facility] Berniece Salines, FNP   8 months ago Sciatica of left side   [facility] Berniece Salines, FNP       Future Appointments             In 6 months [facility]             Passed - Last BP in normal range    BP Readings from Last 1 Encounters:  11/03/22 132/74          olmesartan (BENICAR) 20 MG tablet [Pharmacy Med Name: Olmesartan Medoxomil 20 MG Oral Tablet] 30 tablet 0    Sig: Take 1 tablet by mouth once daily     Cardiovascular:  Angiotensin Receptor Blockers Failed - 05/09/2023  6:06 PM      Failed - Cr in normal range and within 180  days    Creat  Date Value Ref Range Status  10/04/2022 1.07 (H) 0.50 - 1.05 mg/dL Final         Failed - K in normal range and within 180 days    Potassium  Date Value Ref Range Status  10/04/2022 5.3 3.5 - 5.3 mmol/L Final         Failed - Valid encounter within last 6 months    Recent Outpatient Visits           6 months ago Viral upper respiratory tract infection   [facility] Berniece Salines, FNP   6 months ago Essential hypertension, benign   [facility] Berniece Salines, FNP   7 months ago Myalgia   [facility] Berniece Salines, FNP   8 months ago Sciatica of left side   [facility] Della Goo F, FNP   8 months ago Sciatica of left side  [facility] Della Goo F, FNP       Future Appointments             In 6 months [facility]             Passed - Patient is not pregnant      Passed - Last BP in normal range    BP Readings from Last 1 Encounters:  11/03/22 132/74

## 2023-06-15 ENCOUNTER — Ambulatory Visit: Payer: Self-pay

## 2023-06-15 NOTE — Telephone Encounter (Signed)
  Chief Complaint: Hives, anxiety, not sleeping Symptoms: above Frequency: 1 month Pertinent Negatives: Patient denies  Disposition: [] ED /[] Urgent Care (no appt availability in office) / [] Appointment(In office/virtual)/ []  [place] [facility]/ [] Home Care/ [x] Refused Recommended Disposition /[]  Mobile Bus/ []  Follow-up with PCP Additional Notes: Pt called with complaint of hives, anxiety and not sleeping. Pt states that this has happened in the past. Pt is not working and does not want to come into the office.  Pt states that in the past provider has called something in for her to help with anxiety, which is what is causing her hives. What ever is called in, should also help her to sleep.    Reason for Disposition  Hives persist > 1 week  Answer Assessment - Initial Assessment Questions 1. APPEARANCE: "What does the rash look like?"      Dried scaly 2. LOCATION: "Where is the rash located?"      Stomach breasts -  3. NUMBER: "How many hives are there?"      many 4. SIZE: "How big are the hives?" (inches, cm, compare to coins) "Do they all look the same or is there lots of variation in shape and size?"      unknown 5. ONSET: "When did the hives begin?" (Hours or days ago)      1 month 6. ITCHING: "Does it itch?" If Yes, ask: "How bad is the itch?"    - MILD: doesn't interfere with normal activities   - MODERATE-SEVERE: interferes with work, school, sleep, or other activities      sometimes 7. RECURRENT PROBLEM: "Have you had hives before?" If Yes, ask: "When was the last time?" and "What happened that time?"      yes 8. TRIGGERS: "Were you exposed to any new food, plant, cosmetic product or animal just before the hives began?"     Small and wide 9. OTHER SYMPTOMS: "Do you have any other symptoms?" (e.g., fever, tongue swelling, difficulty breathing, abdomen pain)     Not sleeping  Protocols used: Hives-A-AH

## 2023-06-16 ENCOUNTER — Ambulatory Visit: Payer: Medicare Other | Admitting: Nurse Practitioner

## 2023-06-16 NOTE — Telephone Encounter (Signed)
Must be seen, can be virtual

## 2023-06-16 NOTE — Telephone Encounter (Signed)
Pt is scheduled °

## 2023-06-16 NOTE — Progress Notes (Deleted)
Name: Vanessa Gross   MRN: [id number]    DOB: [birth year]   Date:06/16/2023       Progress Note  Subjective  Chief Complaint  No chief complaint on file.   I connected with  Lucio Edward Barcia  on 06/16/23 at  2:00 PM EDT by a video enabled telemedicine application and verified that I am speaking with the correct person using two identifiers.  I discussed the limitations of evaluation and management by telemedicine and the availability of in person appointments. The patient expressed understanding and agreed to proceed with a virtual visit  Staff also discussed with the patient that there may be a patient responsible charge related to this service. 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 05/25/2007    Social History   Tobacco Use   Smoking status: Never   Smokeless tobacco: Never  Substance Use Topics   Alcohol use: No    Alcohol/week: 0.0 standard drinks of alcohol     Current Outpatient Medications:    aspirin EC 81 MG tablet, Take 1 tablet (81 mg total) by mouth daily., Disp: , Rfl:    Azelastine HCl 137 MCG/SPRAY SOLN, USE 2 SPRAY(S) IN EACH NOSTRIL TWICE DAILY AS DIRECTED, Disp: 30 mL, Rfl: 3   loratadine (CLARITIN) 10 MG tablet, Take 1 tablet (10 mg total) by mouth daily., Disp: 90 tablet, Rfl: 3   methocarbamol (ROBAXIN) 500 MG tablet, Take 1 tablet (500 mg total) by mouth 2 (two)  times daily as needed for muscle spasms., Disp: 20 tablet, Rfl: 0   Multiple Vitamin (MULTI-VITAMINS) TABS, Take by mouth., Disp: , Rfl:    olmesartan (BENICAR) 20 MG tablet, Take 1 tablet (20 mg total) by mouth daily., Disp: 90 tablet, Rfl: 3   predniSONE (STERAPRED UNI-PAK 21 TAB) 10 MG (21) TBPK tablet, Take as directed on package.  (60 mg po on day 1, 50 mg po on day 2...), Disp: 21 tablet, Rfl: 0   promethazine-dextromethorphan (PROMETHAZINE-DM) 6.25-15 MG/5ML syrup, Take 5 mLs by mouth 4 (four) times daily as needed for cough., Disp: 118 mL, Rfl: 0   triamterene-hydrochlorothiazide (MAXZIDE-25) 37.5-25 MG tablet, Take 1 tablet by mouth daily., Disp: 90 tablet, Rfl: 3  Allergies  Allergen Reactions   Celecoxib    Gabapentin     abnormal dreams    I personally reviewed active problem list, medication list, allergies, notes from last encounter with the patient/caregiver today.  ROS  Constitutional: Negative for fever or weight change.  Respiratory: Negative for cough and shortness of breath.   Cardiovascular: Negative for chest pain or palpitations.  Gastrointestinal: Negative for abdominal pain, no bowel changes.  Musculoskeletal: Negative for gait problem or joint swelling.  Skin: positive for hives Neurological: Negative for dizziness or headache.  No other specific complaints in a complete review of systems (except as listed in HPI above).   Objective  Virtual encounter, vitals not obtained.  There is no  height or weight on file to calculate BMI.  Nursing Note and Vital Signs reviewed.  Physical Exam  Awake alert and oriented, speaking in complete sentences  No results found for this or any previous visit (from the past 72 hour(s)).  Assessment & Plan     -Red flags and when to present for emergency care or RTC including fever >101.86F, chest pain, shortness of breath, new/worsening/un-resolving symptoms, *** reviewed with patient at time of visit. Follow up and  care instructions discussed and provided in AVS. - I discussed the assessment and treatment plan with the patient. The patient was provided an opportunity to ask questions and all were answered. The patient agreed with the plan and demonstrated an understanding of the instructions.  I provided *** minutes of non-face-to-face time during this encounter.  Berniece Salines, FNP

## 2023-06-20 ENCOUNTER — Ambulatory Visit (INDEPENDENT_AMBULATORY_CARE_PROVIDER_SITE_OTHER): Payer: Medicare Other | Admitting: Internal Medicine

## 2023-06-20 ENCOUNTER — Telehealth: Payer: Self-pay | Admitting: Nurse Practitioner

## 2023-06-20 ENCOUNTER — Encounter: Payer: Self-pay | Admitting: Internal Medicine

## 2023-06-20 VITALS — BP 136/82 | HR 95 | Temp 98.4°F | Resp 18 | Ht 62.0 in | Wt 215.5 lb

## 2023-06-20 DIAGNOSIS — B372 Candidiasis of skin and nail: Secondary | ICD-10-CM

## 2023-06-20 DIAGNOSIS — M5432 Sciatica, left side: Secondary | ICD-10-CM | POA: Diagnosis not present

## 2023-06-20 DIAGNOSIS — F419 Anxiety disorder, unspecified: Secondary | ICD-10-CM | POA: Diagnosis not present

## 2023-06-20 DIAGNOSIS — L299 Pruritus, unspecified: Secondary | ICD-10-CM | POA: Diagnosis not present

## 2023-06-20 MED ORDER — TIZANIDINE HCL 2 MG PO CAPS
2.0000 mg | ORAL_CAPSULE | Freq: Every evening | ORAL | 0 refills | Status: AC | PRN
Start: 2023-06-20 — End: ?

## 2023-06-20 MED ORDER — HYDROXYZINE PAMOATE 25 MG PO CAPS: 25.0000 mg | ORAL_CAPSULE | Freq: Three times a day (TID) | ORAL | 0 refills | Status: DC | PRN

## 2023-06-20 MED ORDER — NAPROXEN 500 MG PO TABS: 500.0000 mg | ORAL_TABLET | Freq: Every day | ORAL | 0 refills | Status: DC | PRN

## 2023-06-20 MED ORDER — NYSTATIN 100000 UNIT/GM EX POWD: 1.0000 | Freq: Three times a day (TID) | CUTANEOUS | 0 refills | Status: DC

## 2023-06-20 MED ORDER — FLUCONAZOLE 150 MG PO TABS
150.0000 mg | ORAL_TABLET | Freq: Once | ORAL | 0 refills | Status: AC
Start: 2023-06-20 — End: 2023-06-20

## 2023-06-20 NOTE — Telephone Encounter (Signed)
Copied from CRM [id number]. Topic: General - Other >> Jun 20, 2023 11:37 AM Everette C wrote: Reason for CRM: Lurena Joiner with [facility] has called to request the patient's prescription be changed from capsule form to tablet for insurance coverage  The patient will need their prescription for tizanidine (ZANAFLEX) 2 MG capsule [[id number]] changed in order for it to be covered  Please contact further when possible

## 2023-06-20 NOTE — Progress Notes (Signed)
Acute Office Visit  Subjective:     Patient ID: Vanessa Gross, female    DOB: June 21, 1956, 67 y.o.   MRN: [id number]  Chief Complaint  Patient presents with   Urticaria    All over with itching has noticed over last couple of weeks.  Has been using new aveno lotion?    HPI Patient is in today for rash. Patient has an itchy red rash in her left armpit, under her breasts and under her abdomen for the last few weeks. Patient having more hot flashes, especially at night which makes the itching worse. Also more stressed lately, husband lost his job and having a hard time falling to sleep.   Patient also states her sciatica is flared up. She went to Orthopedics but is still having symptoms.  Review of Systems  Constitutional:  Negative for chills and fever.  Musculoskeletal:  Positive for back pain.  Skin:  Positive for itching and rash.  Psychiatric/Behavioral:  The patient is nervous/anxious and has insomnia.         Objective:    BP 136/82   Pulse 95   Temp 98.4 F (36.9 C)   Resp 18   Ht 5\' 2"  (1.575 m)   Wt 215 lb 8 oz (97.8 kg)   SpO2 99%   BMI 39.42 kg/m  BP Readings from Last 3 Encounters:  06/20/23 136/82  11/03/22 132/74  10/04/22 124/82   Wt Readings from Last 3 Encounters:  06/20/23 215 lb 8 oz (97.8 kg)  11/03/22 207 lb 4.8 oz (94 kg)  10/04/22 208 lb 9.6 oz (94.6 kg)      Physical Exam Constitutional:      Appearance: Normal appearance.  HENT:     Head: Normocephalic and atraumatic.  Eyes:     Conjunctiva/sclera: Conjunctivae normal.  Cardiovascular:     Rate and Rhythm: Normal rate and regular rhythm.  Pulmonary:     Effort: Pulmonary effort is normal.     Breath sounds: Normal breath sounds.  Skin:    General: Skin is warm and dry.     Comments: Fungal rash in left axilla, under breasts and under pannus  Neurological:     General: No focal deficit present.     Mental Status: She is alert. Mental status is at baseline.  Psychiatric:         Mood and Affect: Mood normal.        Behavior: Behavior normal.     No results found for any visits on 06/20/23.      Assessment & Plan:   1. Yeast dermatitis: Will treat with oral Diflucan and Nystatin powder.   - nystatin (MYCOSTATIN/NYSTOP) powder; Apply 1 Application topically 3 (three) times daily.  Dispense: 15 g; Refill: 0 - fluconazole (DIFLUCAN) 150 MG tablet; Take 1 tablet (150 mg total) by mouth once for 1 dose.  Dispense: 2 tablet; Refill: 0  2. Itching/Anxiety: Will also prescribe Hydroxyzine to take as needed for itching, insomnia and anxiety.  - hydrOXYzine (VISTARIL) 25 MG capsule; Take 1 capsule (25 mg total) by mouth every 8 (eight) hours as needed for anxiety or itching.  Dispense: 30 capsule; Refill: 0  3. Sciatica of left side: Treat with Naproxen as needed with food and muscle relaxer.   - naproxen (NAPROSYN) 500 MG tablet; Take 1 tablet (500 mg total) by mouth daily as needed for moderate pain (arthritis/sciatica pain). Please take with food.  Dispense: 20 tablet; Refill: 0 - tizanidine (ZANAFLEX) 2  MG capsule; Take 1 capsule (2 mg total) by mouth at bedtime as needed for muscle spasms.  Dispense: 30 capsule; Refill: 0   Return if symptoms worsen or fail to improve.  Margarita Mail, DO

## 2023-06-21 ENCOUNTER — Other Ambulatory Visit: Payer: Self-pay | Admitting: Internal Medicine

## 2023-06-21 ENCOUNTER — Ambulatory Visit: Payer: Medicare Other | Admitting: Nurse Practitioner

## 2023-06-21 DIAGNOSIS — M5432 Sciatica, left side: Secondary | ICD-10-CM

## 2023-06-21 MED ORDER — TIZANIDINE HCL 2 MG PO TABS: 2.0000 mg | ORAL_TABLET | Freq: Every evening | ORAL | 0 refills | Status: DC | PRN

## 2023-06-21 NOTE — Telephone Encounter (Signed)
FYI

## 2023-06-27 ENCOUNTER — Other Ambulatory Visit: Payer: Self-pay | Admitting: Nurse Practitioner

## 2023-06-27 DIAGNOSIS — J301 Allergic rhinitis due to pollen: Secondary | ICD-10-CM

## 2023-06-28 NOTE — Telephone Encounter (Signed)
Requested medication (s) are due for refill today: no  Requested medication (s) are on the active medication list: med is not on list yest previous fills indicate it as Clalritin  Last refill:  11/03/22 #90 3   Future visit scheduled:no  Notes to clinic:  please review   Requested Prescriptions  Pending Prescriptions Disp Refills   EQ ALL DAY ALLERGY RELIEF 10 MG tablet [Pharmacy Med Name: EQ All Day Allergy Relief 10 MG Oral Tablet] 90 tablet 0    Sig: Take 1 tablet by mouth once daily     Ear, Nose, and Throat:  Antihistamines 2 Failed - 06/27/2023  3:29 PM      Failed - Cr in normal range and within 360 days    Creat  Date Value Ref Range Status  10/04/2022 1.07 (H) 0.50 - 1.05 mg/dL Final         Passed - Valid encounter within last 12 months    Recent Outpatient Visits           1 week ago Yeast dermatitis   [facility] Margarita Mail, DO   7 months ago Viral upper respiratory tract infection   [facility] Berniece Salines, FNP   7 months ago Essential hypertension, benign   [facility] Berniece Salines, FNP   8 months ago Myalgia   [facility] Berniece Salines, FNP   9 months ago Sciatica of left side   [facility] Berniece Salines, FNP       Future Appointments             In 4 months [facility], [facility]

## 2023-11-07 ENCOUNTER — Ambulatory Visit: Payer: Medicare Other

## 2023-11-09 ENCOUNTER — Ambulatory Visit: Payer: Medicare Other

## 2023-11-09 DIAGNOSIS — Z0000 Encounter for general adult medical examination without abnormal findings: Secondary | ICD-10-CM | POA: Diagnosis not present

## 2023-11-09 NOTE — Progress Notes (Signed)
 Subjective:   Vanessa Gross is a 68 y.o. female who presents for Medicare Annual (Subsequent) preventive examination.  Visit Complete: Virtual I connected with  Vanessa Gross on 11/09/23 by a audio enabled telemedicine application and verified that I am speaking with the correct person using two identifiers.  This patient declined Interactive audio and acupuncturist. Therefore the visit was completed with audio only.   Patient Location: Home  Provider Location: Office/Clinic  I discussed the limitations of evaluation and management by telemedicine. The patient expressed understanding and agreed to proceed.  Vital Signs: Because this visit was a virtual/telehealth visit, some criteria may be missing or patient reported. Any vitals not documented were not able to be obtained and vitals that have been documented are patient reported.   Cardiac Risk Factors include: advanced age (>10men, >80 women);dyslipidemia;hypertension;obesity (BMI >30kg/m2);sedentary lifestyle     Objective:    There were no vitals filed for this visit. There is no height or weight on file to calculate BMI.     11/09/2023   11:36 AM 05/16/2017    3:06 PM 02/24/2017    8:46 AM 12/20/2016    3:30 PM 11/17/2016   10:32 AM 08/04/2016    3:13 PM 06/07/2016    3:31 PM  Advanced Directives  Does Patient Have a Medical Advance Directive? No No No No No No No  Would patient like information on creating a medical advance directive? No - Patient declined  No - Patient declined   No - patient declined information No - patient declined information    Current Medications (verified) Outpatient Encounter Medications as of 11/09/2023  Medication Sig   aspirin  EC 81 MG tablet Take 1 tablet (81 mg total) by mouth daily.   Azelastine  HCl 137 MCG/SPRAY SOLN USE 2 SPRAY(S) IN EACH NOSTRIL TWICE DAILY AS DIRECTED   Multiple Vitamin (MULTI-VITAMINS) TABS Take by mouth.   naproxen  (NAPROSYN ) 500 MG tablet Take 1 tablet  (500 mg total) by mouth daily as needed for moderate pain (arthritis/sciatica pain). Please take with food.   nystatin  (MYCOSTATIN /NYSTOP ) powder Apply 1 Application topically 3 (three) times daily.   olmesartan  (BENICAR ) 20 MG tablet Take 1 tablet (20 mg total) by mouth daily.   triamterene -hydrochlorothiazide  (MAXZIDE-25) 37.5-25 MG tablet Take 1 tablet by mouth daily.   hydrOXYzine  (VISTARIL ) 25 MG capsule Take 1 capsule (25 mg total) by mouth every 8 (eight) hours as needed for anxiety or itching.   loratadine  (EQ ALL DAY ALLERGY RELIEF) 10 MG tablet Take 1 tablet by mouth once daily   predniSONE  (STERAPRED UNI-PAK 21 TAB) 10 MG (21) TBPK tablet Take as directed on package.  (60 mg po on day 1, 50 mg po on day 2...) (Patient not taking: Reported on 11/09/2023)   promethazine -dextromethorphan (PROMETHAZINE -DM) 6.25-15 MG/5ML syrup Take 5 mLs by mouth 4 (four) times daily as needed for cough. (Patient not taking: Reported on 11/09/2023)   tiZANidine  (ZANAFLEX ) 2 MG tablet Take 1 tablet (2 mg total) by mouth at bedtime as needed for muscle spasms. (Patient not taking: Reported on 11/09/2023)   No facility-administered encounter medications on file as of 11/09/2023.    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    Past Surgical History:  Procedure Laterality Date   ABDOMINAL HYSTERECTOMY     BREAST EXCISIONAL BIOPSY Left    benign   TONSILLECTOMY AND ADENOIDECTOMY     Family History  Problem  Relation Age of Onset   Hypertension Mother    Cancer Mother        lpymphoma   Stroke Maternal Grandmother    Stroke Maternal Grandfather    Breast cancer Cousin        maternal   Social History   Socioeconomic History   Marital status: Married    Spouse name: Darryle    Number of children: 3   Years of education: 10   Highest education level: GED or equivalent  Occupational History   Not on file  Tobacco Use   Smoking status: Never   Smokeless tobacco: Never  Vaping Use   Vaping status: Never Used  Substance and Sexual Activity   Alcohol use: No    Alcohol/week: 0.0 standard drinks of alcohol   Drug use: No   Sexual activity: Not Currently  Other Topics Concern   Not on file  Social History Narrative   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, plant shut down    Social Drivers of Health   Financial Resource Strain: Medium Risk (11/09/2023)   Overall Financial Resource Strain (CARDIA)    Difficulty of Paying Living Expenses: Somewhat hard  Food Insecurity: No Food Insecurity (11/09/2023)   Hunger Vital Sign    Worried About Running Out of Food in the Last Year: Never true    Ran Out of Food in the Last Year: Never true  Transportation Needs: No Transportation Needs (11/09/2023)   PRAPARE - Administrator, Civil Service (Medical): No    Lack of Transportation (Non-Medical): No  Physical Activity: Insufficiently Active (11/09/2023)   Exercise Vital Sign    Days of Exercise per Week: 2 days    Minutes of Exercise per Session: 20 min  Stress: No Stress Concern Present (11/09/2023)   [employer] of Occupational Health - Occupational Stress Questionnaire    Feeling of Stress : Only a little  Social Connections: Moderately Integrated (11/09/2023)   Social Connection and Isolation Panel [NHANES]    Frequency of Communication with Friends and Family: More than three times a week    Frequency of Social Gatherings with Friends and Family: Once a week    Attends Religious  Services: More than 4 times per year    Active Member of [employer] or Organizations: No    Attends Engineer, Structural: Never    Marital Status: Married    Tobacco Counseling Counseling given: Not Answered   Clinical Intake:  Pre-visit preparation completed: Yes  Pain : No/denies pain     BMI - recorded: 39.3 Nutritional Status: BMI > 30  Obese Nutritional Risks: None Diabetes: No  How often do you need to have someone help you when you read instructions, pamphlets, or other written materials from your doctor or pharmacy?: 1 - Never  Interpreter Needed?: No  Information entered by :: JHONNIE DAS, LPN   Activities of Daily Living    11/09/2023   11:37 AM 06/20/2023   10:39 AM  In your  present state of health, do you have any difficulty performing the following activities:  Hearing? 0 0  Vision? 0 0  Difficulty concentrating or making decisions? 0 0  Walking or climbing stairs? 0 0  Dressing or bathing? 0 0  Doing errands, shopping? 0 0  Preparing Food and eating ? N   Using the Toilet? N   In the past six months, have you accidently leaked urine? N   Do you have problems with loss of bowel control? N   Managing your Medications? N   Managing your Finances? N   Housekeeping or managing your Housekeeping? N     Patient Care Team: Gareth Mliss FALCON, FNP as PCP - General (Nurse Practitioner) Cindie Jesusa HERO, RN as Registered Nurse Dannielle Arlean FALCON, RN (Inactive) as Registered Nurse [place], Lenn HERO, LCSW as Social Worker [facility], JONETTA Bathe, OD (Optometry)  Indicate any recent Medical Services you may have received from other than [facility] providers in the past year (date may be approximate).     Assessment:   This is a routine wellness examination for Sereen.  Hearing/Vision screen Hearing Screening - Comments:: NO AIDS Vision Screening - Comments:: WEARS GLASSES- [facility]   Goals Addressed             This Visit's Progress    DIET - EAT MORE FRUITS  AND VEGETABLES         Depression Screen    11/09/2023   11:34 AM 06/20/2023   10:39 AM 11/03/2022   10:47 AM 11/01/2022    8:29 AM 10/04/2022    1:49 PM 09/07/2022    9:47 AM 08/17/2022    9:52 AM  PHQ 2/9 Scores  PHQ - 2 Score 0 0 0 0 0 0 0  PHQ- 9 Score 0 0 0 0       Fall Risk    11/09/2023   11:37 AM 06/20/2023   10:37 AM 11/09/2022   11:35 AM 11/03/2022   10:47 AM 11/01/2022    8:34 AM  Fall Risk   Falls in the past year? 0 0  1 1  Number falls in past yr: 0 0 0 1 0  Injury with Fall? 0 0 0 0 0  Risk for fall due to : No Fall Risks   Impaired balance/gait;Impaired mobility History of fall(s)  Follow up Falls prevention discussed;Falls evaluation completed  Falls evaluation completed  Falls prevention discussed;Education provided    MEDICARE RISK AT HOME: Medicare Risk at Home Any stairs in or around the home?: Yes If so, are there any without handrails?: No Home free of loose throw rugs in walkways, pet beds, electrical cords, etc?: Yes Adequate lighting in your home to reduce risk of falls?: Yes Life alert?: No Use of a cane, walker or w/c?: No Grab bars in the bathroom?: Yes Shower chair or bench in shower?: No Elevated toilet seat or a handicapped toilet?: No  TIMED UP AND GO:  Was the test performed?  No    Cognitive Function:        11/09/2023   11:38 AM 11/01/2022    8:33 AM  6CIT Screen  What Year? 0 points 0 points  What month? 0 points 0 points  What time? 0 points 0 points  Count back from 20 0 points 0 points  Months in reverse 0 points 0 points  Repeat phrase 0 points 10 points  Total Score 0 points 10 points    Immunizations Immunization History  Administered Date(s)  Administered   Fluad Quad(high Dose 65+) 09/07/2022   Influenza,inj,Quad PF,6+ Mos 11/04/2016, 07/20/2018, 07/24/2020   PFIZER(Purple Top)SARS-COV-2 Vaccination 01/09/2020, 01/30/2020   Tdap 05/12/2022    TDAP status: Up to date  Flu Vaccine status: Declined, Education  has been provided regarding the importance of this vaccine but patient still declined. Advised may receive this vaccine at local pharmacy or Health Dept. Aware to provide a copy of the vaccination record if obtained from local pharmacy or Health Dept. Verbalized acceptance and understanding.  Pneumococcal vaccine status: Declined,  Education has been provided regarding the importance of this vaccine but patient still declined. Advised may receive this vaccine at local pharmacy or Health Dept. Aware to provide a copy of the vaccination record if obtained from local pharmacy or Health Dept. Verbalized acceptance and understanding.   Covid-19 vaccine status: Completed vaccines  Qualifies for Shingles Vaccine? Yes   Zostavax completed No   Shingrix Completed?: No.    Education has been provided regarding the importance of this vaccine. Patient has been advised to call insurance company to determine out of pocket expense if they have not yet received this vaccine. Advised may also receive vaccine at local pharmacy or Health Dept. Verbalized acceptance and understanding.  Screening Tests Health Maintenance  Topic Date Due   Zoster Vaccines- Shingrix (1 of 2) Never done   Colonoscopy  06/07/2021   Pneumonia Vaccine 75+ Years old (1 of 1 - PCV) Never done   DEXA SCAN  Never done   COVID-19 Vaccine (3 - 2024-25 season) 07/09/2023   INFLUENZA VACCINE  02/05/2024 (Originally 06/08/2023)   MAMMOGRAM  02/10/2024   Medicare Annual Wellness (AWV)  11/08/2024   DTaP/Tdap/Td (2 - Td or Tdap) 05/12/2032   Hepatitis C Screening  Completed   HPV VACCINES  Aged Out    Health Maintenance  Health Maintenance Due  Topic Date Due   Zoster Vaccines- Shingrix (1 of 2) Never done   Colonoscopy  06/07/2021   Pneumonia Vaccine 45+ Years old (1 of 1 - PCV) Never done   DEXA SCAN  Never done   COVID-19 Vaccine (3 - 2024-25 season) 07/09/2023    DECLINED REFERRAL FOR COLONOSCOPY- WANTS TO CHECK AND SEE IF  INSURANCE WILL PAY  Mammogram status: Completed 02/10/23. Repeat every year  DECLINED REFERRAL FOR BDS  Lung Cancer Screening: (Low Dose CT Chest recommended if Age 77-80 years, 20 pack-year currently smoking OR have quit w/in 15years.) does not qualify.    Additional Screening:  Hepatitis C Screening: does qualify; Completed 06/24/16  Vision Screening: Recommended annual ophthalmology exams for early detection of glaucoma and other disorders of the eye. Is the patient up to date with their annual eye exam?  Yes  Who is the provider or what is the name of the office in which the patient attends annual eye exams? [facility] If pt is not established with a provider, would they like to be referred to a provider to establish care? No .   Dental Screening: Recommended annual dental exams for proper oral hygiene   Community Resource Referral / Chronic Care Management: CRR required this visit?  No   CCM required this visit?  No     Plan:     I have personally reviewed and noted the following in the patient's chart:   Medical and social history Use of alcohol, tobacco or illicit drugs  Current medications and supplements including opioid prescriptions. Patient is not currently taking opioid prescriptions. Functional ability and status Nutritional  status Physical activity Advanced directives List of other physicians Hospitalizations, surgeries, and ER visits in previous 12 months Vitals Screenings to include cognitive, depression, and falls Referrals and appointments  In addition, I have reviewed and discussed with patient certain preventive protocols, quality metrics, and best practice recommendations. A written personalized care plan for preventive services as well as general preventive health recommendations were provided to patient.     Jhonnie GORMAN Das, LPN   06/13/7973   After Visit Summary: (MyChart) Due to this being a telephonic visit, the after visit summary with patients  personalized plan was offered to patient via MyChart   Nurse Notes: NONE

## 2023-11-09 NOTE — Patient Instructions (Addendum)
 Vanessa Gross , Thank you for taking time to come for your Medicare Wellness Visit. I appreciate your ongoing commitment to your health goals. Please review the following plan we discussed and let me know if I can assist you in the future.   Referrals/Orders/Follow-Ups/Clinician Recommendations: NONE  This is a list of the screening recommended for you and due dates:  Health Maintenance  Topic Date Due   Zoster (Shingles) Vaccine (1 of 2) Never done   Colon Cancer Screening  06/07/2021   Pneumonia Vaccine (1 of 1 - PCV) Never done   DEXA scan (bone density measurement)  Never done   COVID-19 Vaccine (3 - 2024-25 season) 07/09/2023   Flu Shot  02/05/2024*   Mammogram  02/10/2024   Medicare Annual Wellness Visit  11/08/2024   DTaP/Tdap/Td vaccine (2 - Td or Tdap) 05/12/2032   Hepatitis C Screening  Completed   HPV Vaccine  Aged Out  *Topic was postponed. The date shown is not the original due date.    Advanced directives: (ACP Link)Information on Advanced Care Planning can be found at [place]  Secretary of [facility] Advance Health Care Directives Advance Health Care Directives ([email or web address]   Next Medicare Annual Wellness Visit scheduled for next year: Yes    11/21/24 @ 10:50 AM IN PERSON

## 2023-11-16 ENCOUNTER — Other Ambulatory Visit: Payer: Self-pay | Admitting: Nurse Practitioner

## 2023-11-16 DIAGNOSIS — I10 Essential (primary) hypertension: Secondary | ICD-10-CM

## 2023-11-20 ENCOUNTER — Other Ambulatory Visit: Payer: Self-pay | Admitting: Nurse Practitioner

## 2023-11-20 DIAGNOSIS — J301 Allergic rhinitis due to pollen: Secondary | ICD-10-CM

## 2023-11-20 NOTE — Telephone Encounter (Signed)
 OV needed for additional refills, 30 day supply given until OV can be scheduled.  Requested Prescriptions  Pending Prescriptions Disp Refills   olmesartan  (BENICAR ) 20 MG tablet [Pharmacy Med Name: Olmesartan  Medoxomil 20 MG Oral Tablet] 30 tablet 0    Sig: Take 1 tablet by mouth once daily     Cardiovascular:  Angiotensin Receptor Blockers Failed - 11/20/2023 11:11 AM      Failed - Cr in normal range and within 180 days    Creat  Date Value Ref Range Status  10/04/2022 1.07 (H) 0.50 - 1.05 mg/dL Final         Failed - K in normal range and within 180 days    Potassium  Date Value Ref Range Status  10/04/2022 5.3 3.5 - 5.3 mmol/L Final         Passed - Patient is not pregnant      Passed - Last BP in normal range    BP Readings from Last 1 Encounters:  06/20/23 136/82         Passed - Valid encounter within last 6 months    Recent Outpatient Visits           5 months ago Yeast dermatitis   [facility] Bernardo Fend, DO   1 year ago Viral upper respiratory tract infection   [facility] Gareth Mliss FALCON, FNP   1 year ago Essential hypertension, benign   [facility] Gareth Mliss FALCON, FNP   1 year ago Myalgia   [facility] Gareth Mliss FALCON, FNP   1 year ago Sciatica of left side   [facility] Gareth Mliss FALCON, FNP               triamterene -hydrochlorothiazide  (MAXZIDE-25) 37.5-25 MG tablet [Pharmacy Med Name: Triamterene -HCTZ 37.5-25 MG Oral Tablet] 30 tablet 0    Sig: Take 1 tablet by mouth once daily     Cardiovascular: Diuretic Combos Failed - 11/20/2023 11:11 AM      Failed - K in normal range and within 180 days    Potassium  Date Value Ref Range Status  10/04/2022 5.3 3.5 - 5.3 mmol/L Final         Failed - Na in normal range and within 180 days    Sodium  Date Value Ref Range Status  10/04/2022 144 135 - 146 mmol/L  Final         Failed - Cr in normal range and within 180 days    Creat  Date Value Ref Range Status  10/04/2022 1.07 (H) 0.50 - 1.05 mg/dL Final         Passed - Last BP in normal range    BP Readings from Last 1 Encounters:  06/20/23 136/82         Passed - Valid encounter within last 6 months    Recent Outpatient Visits           5 months ago Yeast dermatitis   [facility] Bernardo Fend, DO   1 year ago Viral upper respiratory tract infection   [facility] Gareth Mliss FALCON, FNP   1 year ago Essential hypertension, benign   [facility] Gareth Mliss FALCON, FNP   1 year ago Myalgia   [facility] Gareth Mliss FALCON, FNP   1 year ago Sciatica of left side  [facility] [place], Mliss FALCON, [place]

## 2023-11-21 NOTE — Telephone Encounter (Signed)
 Requested Prescriptions  Pending Prescriptions Disp Refills   Azelastine  HCl 137 MCG/SPRAY SOLN [Pharmacy Med Name: Azelastine  HCl 137 MCG/SPRAY Nasal Solution] 30 mL 0    Sig: USE 2 SPRAY(S) IN EACH NOSTRIL TWICE DAILY AS DIRECTED     Ear, Nose, and Throat: Nasal Preparations - Antiallergy Passed - 11/21/2023  2:07 PM      Passed - Valid encounter within last 12 months    Recent Outpatient Visits           5 months ago Yeast dermatitis   [facility] Bernardo Fend, DO   1 year ago Viral upper respiratory tract infection   [facility] Gareth Mliss FALCON, FNP   1 year ago Essential hypertension, benign   [facility] Gareth Mliss FALCON, FNP   1 year ago Myalgia   [facility] Gareth Mliss FALCON, FNP   1 year ago Sciatica of left side   [facility] Gareth Mliss FALCON, [place]

## 2023-11-30 ENCOUNTER — Ambulatory Visit: Payer: Self-pay

## 2023-11-30 NOTE — Telephone Encounter (Signed)
      Chief Complaint: Pt. States she is not sleeping at night, has racing thoughts and anxiety. "I can't afford to come in for a visit. I just need something to help me sleep." Decline OV. Symptoms: Above Frequency: "For a long time." Pertinent Negatives: Patient denies self harm thoughts. Disposition: [] ED /[] Urgent Care (no appt availability in office) / [] Appointment(In office/virtual)/ []  [facility]/ [] Home Care/ [x] Refused Recommended Disposition /[] [place]/ []  Follow-up with PCP Additional Notes: Please advise pt. Asking for medication.  Reason for Disposition  MODERATE anxiety (e.g., persistent or frequent anxiety symptoms; interferes with sleep, school, or work)  Answer Assessment - Initial Assessment Questions 1. CONCERN: "Did anything happen that prompted you to call today?"      Anxiety 2. ANXIETY SYMPTOMS: "Can you describe how you (your loved one; patient) have been feeling?" (e.g., tense, restless, panicky, anxious, keyed up, overwhelmed, sense of impending doom).      Anxiety 3. ONSET: "How long have you been feeling this way?" (e.g., hours, days, weeks)     A long time 4. SEVERITY: "How would you rate the level of anxiety?" (e.g., 0 - 10; or mild, moderate, severe).     Mild 5. FUNCTIONAL IMPAIRMENT: "How have these feelings affected your ability to do daily activities?" "Have you had more difficulty than usual doing your normal daily activities?" (e.g., getting better, same, worse; self-care, school, work, interactions)     Not sleeping well 6. HISTORY: "Have you felt this way before?" "Have you ever been diagnosed with an anxiety problem in the past?" (e.g., generalized anxiety disorder, panic attacks, PTSD). If Yes, ask: "How was this problem treated?" (e.g., medicines, counseling, etc.)     No 7. RISK OF HARM - SUICIDAL IDEATION: "Do you ever have thoughts of hurting or killing yourself?" If Yes, ask:  "Do you have these feelings now?" "Do  you have a plan on how you would do this?"     No 8. TREATMENT:  "What has been done so far to treat this anxiety?" (e.g., medicines, relaxation strategies). "What has helped?"     Medication 9. TREATMENT - THERAPIST: "Do you have a counselor or therapist? Name?"     No 10. POTENTIAL TRIGGERS: "Do you drink caffeinated beverages (e.g., coffee, colas, teas), and how much daily?" "Do you drink alcohol or use any drugs?" "Have you started any new medicines recently?"       No 11. PATIENT SUPPORT: "Who is with you now?" "Who do you live with?" "Do you have family or friends who you can talk to?"        Husband 12. OTHER SYMPTOMS: "Do you have any other symptoms?" (e.g., feeling depressed, trouble concentrating, trouble sleeping, trouble breathing, palpitations or fast heartbeat, chest pain, sweating, nausea, or diarrhea)       No 13. PREGNANCY: "Is there any chance you are pregnant?" "When was your last menstrual period?"       no  Protocols used: Anxiety and Panic Attack-A-AH

## 2023-12-01 NOTE — Telephone Encounter (Signed)
Pt scheduled a virtual appt for Monday. She is upset that she has to have an appointment but scheduled one anyway.

## 2023-12-04 ENCOUNTER — Telehealth (INDEPENDENT_AMBULATORY_CARE_PROVIDER_SITE_OTHER): Payer: Medicare Other | Admitting: Nurse Practitioner

## 2023-12-04 ENCOUNTER — Encounter: Payer: Self-pay | Admitting: Nurse Practitioner

## 2023-12-04 DIAGNOSIS — F419 Anxiety disorder, unspecified: Secondary | ICD-10-CM | POA: Diagnosis not present

## 2023-12-04 DIAGNOSIS — F330 Major depressive disorder, recurrent, mild: Secondary | ICD-10-CM | POA: Insufficient documentation

## 2023-12-04 DIAGNOSIS — G4700 Insomnia, unspecified: Secondary | ICD-10-CM | POA: Diagnosis not present

## 2023-12-04 MED ORDER — SERTRALINE HCL 25 MG PO TABS
25.0000 mg | ORAL_TABLET | Freq: Every day | ORAL | 0 refills | Status: DC
Start: 2023-12-04 — End: 2024-01-17

## 2023-12-04 MED ORDER — TRAZODONE HCL 50 MG PO TABS
25.0000 mg | ORAL_TABLET | Freq: Every evening | ORAL | 0 refills | Status: DC | PRN
Start: 2023-12-04 — End: 2024-01-17

## 2023-12-04 NOTE — Progress Notes (Signed)
Name: Vanessa Gross   MRN: [id number]    DOB: 07-28-1956   Date:12/04/2023       Progress Note  Subjective  Chief Complaint  Chief Complaint  Patient presents with   Anxiety    I connected with  Lucio Edward Meinzer  on 12/04/23 at  3:00 PM EST by a video enabled telemedicine application and verified that I am speaking with the correct person using two identifiers.  I discussed the limitations of evaluation and management by telemedicine and the availability of in person appointments. The patient expressed understanding and agreed to proceed with a virtual visit  Staff also discussed with the patient that there may be a patient responsible charge related to this service. Patient Location: home Provider Location: [facility] Additional Individuals present: alone  HPI  Discussed the use of AI scribe software for clinical note transcription with the patient, who gave verbal consent to proceed.  History of Present Illness   The patient presents with increased anxiety and insomnia, which she attributes to her spouse's recent job loss. She reports needing 'help bad' and is seeking treatment for her symptoms. She denies any recent treatment for anxiety, but acknowledges a remote history of treatment. The patient's anxiety and depression screenings were positive today. She also expresses concern about the cost of medications and her ability to afford them.       12/04/2023    2:35 PM 11/09/2023   11:34 AM 06/20/2023   10:39 AM 11/03/2022   10:47 AM 11/01/2022    8:29 AM  Depression screen PHQ 2/9  Decreased Interest 3 0 0 0 0  Down, Depressed, Hopeless 2 0 0 0 0  PHQ - 2 Score 5 0 0 0 0  Altered sleeping 3 0 0 0 0  Tired, decreased energy 2 0 0 0 0  Change in appetite 3 0 0 0 0  Feeling bad or failure about yourself  0 0 0 0 0  Trouble concentrating 0 0 0 0 0  Moving slowly or fidgety/restless 0 0 0 0 0  Suicidal thoughts 0 0 0 0 0  PHQ-9 Score 13 0 0 0 0  Difficult doing work/chores Somewhat  difficult Not difficult at all Not difficult at all Not difficult at all        12/04/2023    2:37 PM 11/01/2022    8:36 AM  GAD 7 : Generalized Anxiety Score  Nervous, Anxious, on Edge 2 0  Control/stop worrying 3 1  Worry too much - different things 2 1  Trouble relaxing 2 1  Restless 2 0  Easily annoyed or irritable 2 0  Afraid - awful might happen 1 0  Total GAD 7 Score 14 3  Anxiety Difficulty Very difficult Not difficult at all     Patient Active Problem List   Diagnosis Date Noted   Mild episode of recurrent major depressive disorder (HCC) 12/04/2023   Low back pain 09/14/2022   Seasonal allergic rhinitis due to pollen 05/04/2022   RBC microcytosis 06/17/2016   Class 2 severe obesity with serious comorbidity and body mass index (BMI) of 36.0 to 36.9 in adult, unspecified obesity type (HCC) 06/07/2016   Allergic conjunctivitis and rhinitis 10/19/2015   Anxiety 05/29/2015   HLD (hyperlipidemia) 05/29/2015   Insomnia 05/29/2015   Essential hypertension, benign 05/25/2007   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 05/25/2007    Social History   Tobacco Use   Smoking status: Never  Smokeless tobacco: Never  Substance Use Topics   Alcohol use: No    Alcohol/week: 0.0 standard drinks of alcohol     Current Outpatient Medications:    sertraline (ZOLOFT) 25 MG tablet, Take 1 tablet (25 mg total) by mouth daily., Disp: 30 tablet, Rfl: 0   traZODone (DESYREL) 50 MG tablet, Take 0.5-1 tablets (25-50 mg total) by mouth at bedtime as needed for sleep., Disp: 30 tablet, Rfl: 0   aspirin EC 81 MG tablet, Take 1 tablet (81 mg total) by mouth daily., Disp: , Rfl:    Azelastine HCl 137 MCG/SPRAY SOLN, USE 2 SPRAY(S) IN EACH NOSTRIL TWICE DAILY AS DIRECTED, Disp: 30 mL, Rfl: 0   hydrOXYzine (VISTARIL) 25 MG capsule, Take 1 capsule (25 mg total) by mouth every 8 (eight) hours as needed for anxiety or itching., Disp: 30 capsule, Rfl: 0   loratadine (EQ ALL  DAY ALLERGY RELIEF) 10 MG tablet, Take 1 tablet by mouth once daily, Disp: 90 tablet, Rfl: 3   Multiple Vitamin (MULTI-VITAMINS) TABS, Take by mouth., Disp: , Rfl:    naproxen (NAPROSYN) 500 MG tablet, Take 1 tablet (500 mg total) by mouth daily as needed for moderate pain (arthritis/sciatica pain). Please take with food., Disp: 20 tablet, Rfl: 0   nystatin (MYCOSTATIN/NYSTOP) powder, Apply 1 Application topically 3 (three) times daily., Disp: 15 g, Rfl: 0   olmesartan (BENICAR) 20 MG tablet, Take 1 tablet by mouth once daily, Disp: 30 tablet, Rfl: 0   triamterene-hydrochlorothiazide (MAXZIDE-25) 37.5-25 MG tablet, Take 1 tablet by mouth once daily, Disp: 30 tablet, Rfl: 0  Allergies  Allergen Reactions   Celecoxib    Gabapentin     abnormal dreams    I personally reviewed active problem list, medication list, allergies, notes from last encounter with the patient/caregiver today.  ROS  Constitutional: Negative for fever or weight change.  Respiratory: Negative for cough and shortness of breath.   Cardiovascular: Negative for chest pain or palpitations.  Gastrointestinal: Negative for abdominal pain, no bowel changes.  Musculoskeletal: Negative for gait problem or joint swelling.  Skin: Negative for rash.  Neurological: Negative for dizziness or headache.  No other specific complaints in a complete review of systems (except as listed in HPI above).   Objective  Virtual encounter, vitals not obtained.  There is no height or weight on file to calculate BMI.  Nursing Note and Vital Signs reviewed.  Physical Exam  Awake, alert and oriented, speaking in complete sentences   No results found for this or any previous visit (from the past 72 hours).  Assessment & Plan  Problem List Items Addressed This Visit       Other   Anxiety - Primary   Relevant Medications   sertraline (ZOLOFT) 25 MG tablet   traZODone (DESYREL) 50 MG tablet   Insomnia   Relevant Medications    traZODone (DESYREL) 50 MG tablet   Mild episode of recurrent major depressive disorder (HCC)   Relevant Medications   sertraline (ZOLOFT) 25 MG tablet   traZODone (DESYREL) 50 MG tablet   Assessment and Plan    Anxiety, depression and Insomnia Positive anxiety and depression screenings. Patient reports increased anxiety and trouble sleeping due to husband's job loss. No recent history of anxiety medication use. -Start Trazodone 25-50 mg, half to one tablet at bedtime for sleep. -Start Zoloft 25 mg daily in the morning for anxiety. -Follow up in four weeks to assess response to medications. If insurance does not cover these  medications, consider alternatives.         -Red flags and when to present for emergency care or RTC including fever >101.10F, chest pain, shortness of breath, new/worsening/un-resolving symptoms,  reviewed with patient at time of visit. Follow up and care instructions discussed and provided in AVS. - I discussed the assessment and treatment plan with the patient. The patient was provided an opportunity to ask questions and all were answered. The patient agreed with the plan and demonstrated an understanding of the instructions.  I provided 20 minutes of non-face-to-face time during this encounter.  Berniece Salines, FNP

## 2023-12-12 ENCOUNTER — Other Ambulatory Visit: Payer: Self-pay | Admitting: Nurse Practitioner

## 2023-12-12 DIAGNOSIS — I10 Essential (primary) hypertension: Secondary | ICD-10-CM

## 2023-12-13 NOTE — Telephone Encounter (Signed)
 Requested medication (s) are due for refill today - yes  Requested medication (s) are on the active medication list -yes  Future visit scheduled -no  Last refill: 11/20/23 #30  Notes to clinic: fails lab protocol- over 1 year-10/04/22  Requested Prescriptions  Pending Prescriptions Disp Refills   triamterene -hydrochlorothiazide  (MAXZIDE-25) 37.5-25 MG tablet [Pharmacy Med Name: Triamterene -HCTZ 37.5-25 MG Oral Tablet] 30 tablet 0    Sig: Take 1 tablet by mouth once daily     Cardiovascular: Diuretic Combos Failed - 12/13/2023  1:49 PM      Failed - K in normal range and within 180 days    Potassium  Date Value Ref Range Status  10/04/2022 5.3 3.5 - 5.3 mmol/L Final         Failed - Na in normal range and within 180 days    Sodium  Date Value Ref Range Status  10/04/2022 144 135 - 146 mmol/L Final         Failed - Cr in normal range and within 180 days    Creat  Date Value Ref Range Status  10/04/2022 1.07 (H) 0.50 - 1.05 mg/dL Final         Passed - Last BP in normal range    BP Readings from Last 1 Encounters:  06/20/23 136/82         Passed - Valid encounter within last 6 months    Recent Outpatient Visits           1 week ago Anxiety   [facility] Gareth Mliss FALCON, FNP   5 months ago Yeast dermatitis   [facility] Bernardo Fend, DO   1 year ago Viral upper respiratory tract infection   [facility] Gareth Mliss FALCON, FNP   1 year ago Essential hypertension, benign   [facility] Gareth Mliss FALCON, FNP   1 year ago Myalgia   [facility] Gareth Mliss FALCON, [place]                 Requested Prescriptions  Pending Prescriptions Disp Refills   triamterene -hydrochlorothiazide  (MAXZIDE-25) 37.5-25 MG tablet [Pharmacy Med Name: Triamterene -HCTZ 37.5-25 MG Oral Tablet] 30 tablet 0    Sig: Take 1 tablet by mouth once daily      Cardiovascular: Diuretic Combos Failed - 12/13/2023  1:49 PM      Failed - K in normal range and within 180 days    Potassium  Date Value Ref Range Status  10/04/2022 5.3 3.5 - 5.3 mmol/L Final         Failed - Na in normal range and within 180 days    Sodium  Date Value Ref Range Status  10/04/2022 144 135 - 146 mmol/L Final         Failed - Cr in normal range and within 180 days    Creat  Date Value Ref Range Status  10/04/2022 1.07 (H) 0.50 - 1.05 mg/dL Final         Passed - Last BP in normal range    BP Readings from Last 1 Encounters:  06/20/23 136/82         Passed - Valid encounter within last 6 months    Recent Outpatient Visits           1 week ago Anxiety   [facility] Gareth Mliss FALCON, FNP   5 months ago Yeast dermatitis  [facility] Bernardo Fend, DO   1 year ago Viral upper respiratory tract infection   [facility] G[facility] Mliss FALCON, FNP   1 year ago Essential hypertension, benign   [facility] Gareth Mliss FALCON, FNP   1 year ago Myalgia   [facility] Gareth Mliss FALCON, [place]

## 2024-01-10 ENCOUNTER — Other Ambulatory Visit: Payer: Self-pay | Admitting: Nurse Practitioner

## 2024-01-10 DIAGNOSIS — I10 Essential (primary) hypertension: Secondary | ICD-10-CM

## 2024-01-10 DIAGNOSIS — J301 Allergic rhinitis due to pollen: Secondary | ICD-10-CM

## 2024-01-11 NOTE — Telephone Encounter (Signed)
 Requested medication (s) are due for refill today:   yes for all 3  Requested medication (s) are on the active medication list:   Yes for all 3  Future visit scheduled:   No   LOV 12/04/2023  virtual with Della Goo, FNP   Last ordered: Claritin 06/29/2023 #90, 3 refills;   Maxzide 25/2025 #30, 0 refills;   Benicar 11/20/2023 #30, 0 refills  Unable to refill because labs are due.   Requested Prescriptions  Pending Prescriptions Disp Refills   loratadine (CLARITIN) 10 MG tablet [Pharmacy Med Name: Loratadine 10 MG Oral Tablet] 90 tablet 0    Sig: Take 1 tablet by mouth once daily     Ear, Nose, and Throat:  Antihistamines 2 Failed - 01/11/2024  3:07 PM      Failed - Cr in normal range and within 360 days    Creat  Date Value Ref Range Status  10/04/2022 1.07 (H) 0.50 - 1.05 mg/dL Final         Passed - Valid encounter within last 12 months    Recent Outpatient Visits           1 month ago Anxiety   [facility] Berniece Salines, FNP   6 months ago Yeast dermatitis   [facility]arita Mail, DO   1 year ago Viral upper respiratory tract infection   [facility] Berniece Salines, FNP   1 year ago Essential hypertension, benign   [facility] Berniece Salines, FNP   1 year ago Myalgia   [facility] Berniece Salines, FNP               triamterene-hydrochlorothiazide (MAXZIDE-25) 37.5-25 MG tablet [Pharmacy Med Name: Triamterene-HCTZ 37.5-25 MG Oral Tablet] 30 tablet 0    Sig: TAKE 1 TABLET BY MOUTH ONCE DAILY . APPOINTMENT REQUIRED FOR FUTURE REFILLS     Cardiovascular: Diuretic Combos Failed - 01/11/2024  3:07 PM      Failed - K in normal range and within 180 days    Potassium  Date Value Ref Range Status  10/04/2022 5.3 3.5 - 5.3 mmol/L Final         Failed - Na in normal range and within 180 days    Sodium  Date Value Ref Range  Status  10/04/2022 144 135 - 146 mmol/L Final         Failed - Cr in normal range and within 180 days    Creat  Date Value Ref Range Status  10/04/2022 1.07 (H) 0.50 - 1.05 mg/dL Final         Passed - Last BP in normal range    BP Readings from Last 1 Encounters:  06/20/23 136/82         Passed - Valid encounter within last 6 months    Recent Outpatient Visits           1 month ago Anxiety   [facility] Berniece Salines, FNP   6 months ago Yeast dermatitis   [facility]arita Mail, DO   1 year ago Viral upper respiratory tract infection   [facility] Berniece Salines, FNP   1 year ago Essential hypertension, benign   [facility] Berniece Salines, FNP   1 year ago Myalgia   [facility] [place],  Rudolpho Sevin, FNP               olmesartan (BENICAR) 20 MG tablet [Pharmacy Med Name: Olmesartan Medoxomil 20 MG Oral Tablet] 30 tablet 0    Sig: Take 1 tablet by mouth once daily     Cardiovascular:  Angiotensin Receptor Blockers Failed - 01/11/2024  3:07 PM      Failed - Cr in normal range and within 180 days    Creat  Date Value Ref Range Status  10/04/2022 1.07 (H) 0.50 - 1.05 mg/dL Final         Failed - K in normal range and within 180 days    Potassium  Date Value Ref Range Status  10/04/2022 5.3 3.5 - 5.3 mmol/L Final         Passed - Patient is not pregnant      Passed - Last BP in normal range    BP Readings from Last 1 Encounters:  06/20/23 136/82         Passed - Valid encounter within last 6 months    Recent Outpatient Visits           1 month ago Anxiety   [facility] Berniece Salines, FNP   6 months ago Yeast dermatitis   [facility] Margarita Mail, DO   1 year ago Viral upper respiratory tract infection   [facility] Berniece Salines, FNP   1 year ago Essential hypertension, benign   [facility] Berniece Salines, FNP   1 year ago Myalgia   [facility] Berniece Salines, [place]

## 2024-01-13 ENCOUNTER — Other Ambulatory Visit: Payer: Self-pay | Admitting: Nurse Practitioner

## 2024-01-13 DIAGNOSIS — J301 Allergic rhinitis due to pollen: Secondary | ICD-10-CM

## 2024-01-13 DIAGNOSIS — I10 Essential (primary) hypertension: Secondary | ICD-10-CM

## 2024-01-15 ENCOUNTER — Telehealth: Payer: Self-pay

## 2024-01-15 NOTE — Telephone Encounter (Signed)
 Pt schedule for appt for refills

## 2024-01-15 NOTE — Telephone Encounter (Signed)
 Requested medications are due for refill today.  yes  Requested medications are on the active medications list.  yes  Last refill. varied  Future visit scheduled.   no  Notes to clinic.  Expired labs.    Requested Prescriptions  Pending Prescriptions Disp Refills   EQ ALL DAY ALLERGY RELIEF 10 MG tablet [Pharmacy Med Name: EQ All Day Allergy Relief 10 MG Oral Tablet] 90 tablet 0    Sig: Take 1 tablet by mouth once daily     Ear, Nose, and Throat:  Antihistamines 2 Failed - 01/15/2024  2:43 PM      Failed - Cr in normal range and within 360 days    Creat  Date Value Ref Range Status  10/04/2022 1.07 (H) 0.50 - 1.05 mg/dL Final         Passed - Valid encounter within last 12 months    Recent Outpatient Visits           1 month ago Anxiety   [facility] Berniece Salines, FNP   6 months ago Yeast dermatitis   [facility] Margarita Mail, DO   1 year ago Viral upper respiratory tract infection   [facility] Berniece Salines, FNP   1 year ago Essential hypertension, benign   [facility] Berniece Salines, FNP   1 year ago Myalgia   [facility]ce Salines, FNP               triamterene-hydrochlorothiazide (MAXZIDE-25) 37.5-25 MG tablet [Pharmacy Med Name: Triamterene-HCTZ 37.5-25 MG Oral Tablet] 30 tablet 0    Sig: TAKE 1 TABLET BY MOUTH ONCE DAILY . APPOINTMENT REQUIRED FOR FUTURE REFILLS     Cardiovascular: Diuretic Combos Failed - 01/15/2024  2:43 PM      Failed - K in normal range and within 180 days    Potassium  Date Value Ref Range Status  10/04/2022 5.3 3.5 - 5.3 mmol/L Final         Failed - Na in normal range and within 180 days    Sodium  Date Value Ref Range Status  10/04/2022 144 135 - 146 mmol/L Final         Failed - Cr in normal range and within 180 days    Creat  Date Value Ref Range Status  10/04/2022 1.07  (H) 0.50 - 1.05 mg/dL Final         Passed - Last BP in normal range    BP Readings from Last 1 Encounters:  06/20/23 136/82         Passed - Valid encounter within last 6 months    Recent Outpatient Visits           1 month ago Anxiety   [facility] Berniece Salines, FNP   6 months ago Yeast dermatitis   [facility]arita Mail, DO   1 year ago Viral upper respiratory tract infection   [facility] Berniece Salines, FNP   1 year ago Essential hypertension, benign   [facility] Berniece Salines, FNP   1 year ago Myalgia   [facility] Berniece Salines, FNP               olmesartan (BENICAR) 20 MG tablet [Pharmacy Med Name: Olmesartan Medoxomil 20 MG Oral Tablet] 30  tablet 0    Sig: Take 1 tablet by mouth once daily     Cardiovascular:  Angiotensin Receptor Blockers Failed - 01/15/2024  2:43 PM      Failed - Cr in normal range and within 180 days    Creat  Date Value Ref Range Status  10/04/2022 1.07 (H) 0.50 - 1.05 mg/dL Final         Failed - K in normal range and within 180 days    Potassium  Date Value Ref Range Status  10/04/2022 5.3 3.5 - 5.3 mmol/L Final         Passed - Patient is not pregnant      Passed - Last BP in normal range    BP Readings from Last 1 Encounters:  06/20/23 136/82         Passed - Valid encounter within last 6 months    Recent Outpatient Visits           1 month ago Anxiety   [facility] Salines, FNP   6 months ago Yeast dermatitis   [facility], [facility]   1 year ago Viral upper respiratory tract infection   [facility] Salines, FNP   1 year ago Essential hypertension, benign   [facility] Berniece Salines, FNP   1 year ago Myalgia   [facility] Berniece Salines, [place]

## 2024-01-17 ENCOUNTER — Ambulatory Visit (INDEPENDENT_AMBULATORY_CARE_PROVIDER_SITE_OTHER): Admitting: Nurse Practitioner

## 2024-01-17 ENCOUNTER — Other Ambulatory Visit: Payer: Self-pay | Admitting: Nurse Practitioner

## 2024-01-17 ENCOUNTER — Encounter: Payer: Self-pay | Admitting: Nurse Practitioner

## 2024-01-17 VITALS — BP 122/76 | HR 82 | Temp 98.1°F | Resp 18 | Ht 62.0 in | Wt 213.2 lb

## 2024-01-17 DIAGNOSIS — I10 Essential (primary) hypertension: Secondary | ICD-10-CM

## 2024-01-17 DIAGNOSIS — F330 Major depressive disorder, recurrent, mild: Secondary | ICD-10-CM

## 2024-01-17 DIAGNOSIS — Z6836 Body mass index (BMI) 36.0-36.9, adult: Secondary | ICD-10-CM

## 2024-01-17 DIAGNOSIS — F419 Anxiety disorder, unspecified: Secondary | ICD-10-CM

## 2024-01-17 DIAGNOSIS — E66812 Obesity, class 2: Secondary | ICD-10-CM

## 2024-01-17 DIAGNOSIS — J301 Allergic rhinitis due to pollen: Secondary | ICD-10-CM

## 2024-01-17 DIAGNOSIS — E782 Mixed hyperlipidemia: Secondary | ICD-10-CM

## 2024-01-17 MED ORDER — LORATADINE 10 MG PO TABS
10.0000 mg | ORAL_TABLET | Freq: Every day | ORAL | 3 refills | Status: DC
Start: 2024-01-17 — End: 2024-07-19

## 2024-01-17 MED ORDER — OLMESARTAN MEDOXOMIL 20 MG PO TABS: 20.0000 mg | ORAL_TABLET | Freq: Every day | ORAL | 3 refills | Status: DC

## 2024-01-17 MED ORDER — TRIAMTERENE-HCTZ 37.5-25 MG PO TABS: 1.0000 | ORAL_TABLET | Freq: Every day | ORAL | 3 refills | Status: DC

## 2024-01-17 NOTE — Progress Notes (Signed)
 BP 122/76   Pulse 82   Temp 98.1 F (36.7 C)   Resp 18   Ht 5\' 2"  (1.575 m)   Wt 213 lb 3.2 oz (96.7 kg)   BMI 38.99 kg/m    Subjective:    Patient ID: Vanessa Gross, female    DOB: 12-24-1955, 68 y.o.   MRN: [id number]  HPI: Vanessa Gross is a 68 y.o. female  Chief Complaint  Patient presents with   Medical Management of Chronic Issues   Hypertension   Allergic Rhinitis    Medication Refill    Discussed the use of AI scribe software for clinical note transcription with the patient, who gave verbal consent to proceed.  History of Present Illness   The patient presents for follow-up of hypertension, allergies, anxiety, depression, obesity, hyperlipidemia, and insomnia.  She has a history of hypertension and is currently taking olmesartan 20 mg daily and triamterene-hydrochlorothiazide 37.5-25 mg daily. Her blood pressure is well-controlled.  She experiences allergies and uses  loratadine 10 mg two times a day. Discussed with patient that once a day is the max dose for daily use. discussed it is okay to take twice a day occasionally but not every day.  Her eyes become blurry, especially with seasonal changes.  She has a history of anxiety and depression and was previously taking Zoloft 25 mg daily. She reports that neither Zoloft nor other medications have improved her mood. She reports her husband has been out of work causing significant stress. she is concerned about the cost of things.   She experiences insomnia and tried trazodone 50 mg at bedtime, but reports that it did not work for her. She wants a medication that could help her sleep better, referencing a pill advertised on TV, however she was unsure of what medication it was that she saw.   She has not had lab work done since 2023 and is scheduled to have labs today. She expresses financial concerns regarding medication costs.       01/17/2024    9:27 AM 12/04/2023    2:35 PM 11/09/2023   11:34 AM  Depression  screen PHQ 2/9  Decreased Interest 3 3 0  Down, Depressed, Hopeless 3 2 0  PHQ - 2 Score 6 5 0  Altered sleeping 3 3 0  Tired, decreased energy 3 2 0  Change in appetite 3 3 0  Feeling bad or failure about yourself  3 0 0  Trouble concentrating 3 0 0  Moving slowly or fidgety/restless 3 0 0  Suicidal thoughts 0 0 0  PHQ-9 Score 24 13 0  Difficult doing work/chores Very difficult Somewhat difficult Not difficult at all       01/17/2024    9:27 AM 12/04/2023    2:37 PM 11/01/2022    8:36 AM  GAD 7 : Generalized Anxiety Score  Nervous, Anxious, on Edge 3 2 0  Control/stop worrying 3 3 1   Worry too much - different things 3 2 1   Trouble relaxing 3 2 1   Restless 3 2 0  Easily annoyed or irritable 3 2 0  Afraid - awful might happen 3 1 0  Total GAD 7 Score 21 14 3   Anxiety Difficulty Very difficult Very difficult Not difficult at all     Relevant past medical, surgical, family and social history reviewed and updated as indicated. Interim medical history since our last visit reviewed. Allergies and medications reviewed and updated.  Review of Systems  Constitutional:  Negative for fever or weight change.  Respiratory: Negative for cough and shortness of breath.   Cardiovascular: Negative for chest pain or palpitations.  Gastrointestinal: Negative for abdominal pain, no bowel changes.  Musculoskeletal: Negative for gait problem or joint swelling.  Skin: Negative for rash.  Neurological: Negative for dizziness or headache.  No other specific complaints in a complete review of systems (except as listed in HPI above).      Objective:    BP 122/76   Pulse 82   Temp 98.1 F (36.7 C)   Resp 18   Ht 5\' 2"  (1.575 m)   Wt 213 lb 3.2 oz (96.7 kg)   BMI 38.99 kg/m   BP Readings from Last 3 Encounters:  01/17/24 122/76  06/20/23 136/82  11/03/22 132/74     Wt Readings from Last 3 Encounters:  01/17/24 213 lb 3.2 oz (96.7 kg)  06/20/23 215 lb 8 oz (97.8 kg)  11/03/22 207 lb  4.8 oz (94 kg)    Physical Exam Vitals reviewed.  Constitutional:      Appearance: Normal appearance.  HENT:     Head: Normocephalic.  Cardiovascular:     Rate and Rhythm: Normal rate and regular rhythm.  Pulmonary:     Effort: Pulmonary effort is normal.     Breath sounds: Normal breath sounds.  Musculoskeletal:        General: Normal range of motion.  Skin:    General: Skin is warm and dry.  Neurological:     General: No focal deficit present.     Mental Status: She is alert and oriented to person, place, and time. Mental status is at baseline.  Psychiatric:        Mood and Affect: Mood normal.        Behavior: Behavior normal.        Thought Content: Thought content normal.        Judgment: Judgment normal.     Results for orders placed or performed in visit on 10/04/22  CK (Creatine Kinase)   Collection Time: 10/04/22  2:10 PM  Result Value Ref Range   Total CK 126 29 - 143 U/L  COMPLETE METABOLIC PANEL WITH GFR   Collection Time: 10/04/22  2:10 PM  Result Value Ref Range   Glucose, Bld 81 65 - 99 mg/dL   BUN 15 7 - 25 mg/dL   Creat 1.61 (H) 0.96 - 1.05 mg/dL   eGFR 58 (L) > OR = 60 mL/min/1.25m2   BUN/Creatinine Ratio 14 6 - 22 (calc)   Sodium 144 135 - 146 mmol/L   Potassium 5.3 3.5 - 5.3 mmol/L   Chloride 106 98 - 110 mmol/L   CO2 29 20 - 32 mmol/L   Calcium 9.5 8.6 - 10.4 mg/dL   Total Protein 7.0 6.1 - 8.1 g/dL   Albumin 4.2 3.6 - 5.1 g/dL   Globulin 2.8 1.9 - 3.7 g/dL (calc)   AG Ratio 1.5 1.0 - 2.5 (calc)   Total Bilirubin 0.3 0.2 - 1.2 mg/dL   Alkaline phosphatase (APISO) 59 37 - 153 U/L   AST 16 10 - 35 U/L   ALT 13 6 - 29 U/L  Magnesium   Collection Time: 10/04/22  2:10 PM  Result Value Ref Range   Magnesium 2.2 1.5 - 2.5 mg/dL       Assessment & Plan:   Problem List Items Addressed This Visit       Cardiovascular and Mediastinum   Essential hypertension, benign -  Primary (Chronic)   Relevant Medications   olmesartan (BENICAR) 20 MG  tablet   triamterene-hydrochlorothiazide (MAXZIDE-25) 37.5-25 MG tablet   Other Relevant Orders   CBC with Differential/Platelet   COMPLETE METABOLIC PANEL WITH GFR   AMB Referral [facility] Care Management     Respiratory   Seasonal allergic rhinitis due to pollen   Relevant Medications   loratadine (EQ ALL DAY ALLERGY RELIEF) 10 MG tablet     Other   Anxiety   Relevant Orders   AMB Referral [facility] Care Management   HLD (hyperlipidemia)   Relevant Medications   olmesartan (BENICAR) 20 MG tablet   triamterene-hydrochlorothiazide (MAXZIDE-25) 37.5-25 MG tablet   Other Relevant Orders   COMPLETE METABOLIC PANEL WITH GFR   Lipid panel   Class 2 severe obesity with serious comorbidity and body mass index (BMI) of 36.0 to 36.9 in adult, unspecified obesity type (HCC)   Mild episode of recurrent major depressive disorder (HCC)   Relevant Orders   AMB Referral [facility]BCI Care Management     Assessment and Plan    Anxiety and Depression Zoloft 25 mg daily has been ineffective for mood improvement. She is experiencing significant stress related to personal and financial issues. - Discontinue Zoloft 25 mg daily - Consider alternative treatments for anxiety and depression  Insomnia She reports difficulty sleeping and is interested in a medication advertised on TV but cannot recall the name. Trazodone 50 mg at bedtime is currently prescribed. - Discuss alternative sleep aids if trazodone is ineffective  Allergic Rhinitis She takes two loratadine pills daily, exceeding the recommended maximum dose. She experiences blurry vision, likely due to allergies, especially with the onset of spring. Astelin nasal spray is effective but costly. Taking more than the recommended dose of loratadine can be harmful and should be avoided. - Educate on the maximum dose of loratadine (10 mg daily) - Refer to pharmacist for assistance with medication costs  Hypertension Hypertension is well-controlled as evidenced by  a normal blood pressure reading during the visit. Stress can elevate blood pressure, but current management appears effective. - Continue olmesartan 20 mg daily - Continue triamterene-hydrochlorothiazide 37.5-25 mg daily  Obesity and Hyperlipidemia -continue to work on lifestyle modification - Order lab work to assess current status of hyperlipidemia  General Health Maintenance She has not had lab work since 2023, indicating a need for updated health assessments. - Order lab work to update health assessments     She is over due for dexa scan and colonoscopy.       Follow up plan: Return in about 6 months (around 07/19/2024) for follow up.

## 2024-01-17 NOTE — Telephone Encounter (Signed)
 Copied from CRM [id number]. Topic: Clinical - Prescription Issue >> Jan 17, 2024  3:25 PM Tiffany S wrote: Reason for CRM: Patient requesting call back. Patient stated the pharmacy doesn't have the prescription for all of her medications loratadine (EQ ALL DAY ALLERGY RELIEF) 10 MG tablet [[id number]]

## 2024-01-18 LAB — COMPLETE METABOLIC PANEL WITH GFR
AG Ratio: 1.8 (calc) (ref 1.0–2.5)
ALT: 31 U/L — ABNORMAL HIGH (ref 6–29)
AST: 18 U/L (ref 10–35)
Albumin: 4.4 g/dL (ref 3.6–5.1)
Alkaline phosphatase (APISO): 62 U/L (ref 37–153)
BUN: 14 mg/dL (ref 7–25)
CO2: 30 mmol/L (ref 20–32)
Calcium: 9.6 mg/dL (ref 8.6–10.4)
Chloride: 104 mmol/L (ref 98–110)
Creat: 0.98 mg/dL (ref 0.50–1.05)
Globulin: 2.5 g/dL (ref 1.9–3.7)
Glucose, Bld: 77 mg/dL (ref 65–99)
Potassium: 5.1 mmol/L (ref 3.5–5.3)
Sodium: 142 mmol/L (ref 135–146)
Total Bilirubin: 0.3 mg/dL (ref 0.2–1.2)
Total Protein: 6.9 g/dL (ref 6.1–8.1)
eGFR: 64 mL/min/{1.73_m2} (ref >=60)

## 2024-01-18 LAB — LIPID PANEL
Cholesterol: 266 mg/dL — ABNORMAL HIGH (ref <200)
HDL: 37 mg/dL — ABNORMAL LOW (ref >=50)
LDL Cholesterol (Calc): 175 mg/dL — ABNORMAL HIGH
Non-HDL Cholesterol (Calc): 229 mg/dL — ABNORMAL HIGH (ref <130)
Total CHOL/HDL Ratio: 7.2 (calc) — ABNORMAL HIGH (ref <5.0)
Triglycerides: 320 mg/dL — ABNORMAL HIGH (ref <150)

## 2024-01-18 LAB — CBC WITH DIFFERENTIAL/PLATELET
Absolute Lymphocytes: 2343 {cells}/uL (ref 850–3900)
Absolute Monocytes: 600 {cells}/uL (ref 200–950)
Basophils Absolute: 88 {cells}/uL (ref 0–200)
Basophils Relative: 1.6 %
Eosinophils Absolute: 88 {cells}/uL (ref 15–500)
Eosinophils Relative: 1.6 %
HCT: 44.6 % (ref 35.0–45.0)
Hemoglobin: 13.4 g/dL (ref 11.7–15.5)
MCH: 20.6 pg — ABNORMAL LOW (ref 27.0–33.0)
MCHC: 30 g/dL — ABNORMAL LOW (ref 32.0–36.0)
MCV: 68.6 fL — ABNORMAL LOW (ref 80.0–100.0)
MPV: 10.6 fL (ref 7.5–12.5)
Monocytes Relative: 10.9 %
Neutro Abs: 2382 {cells}/uL (ref 1500–7800)
Neutrophils Relative %: 43.3 %
Platelets: 316 10*3/uL (ref 140–400)
RBC: 6.5 10*6/uL — ABNORMAL HIGH (ref 3.80–5.10)
RDW: 16.7 % — ABNORMAL HIGH (ref 11.0–15.0)
Total Lymphocyte: 42.6 %
WBC: 5.5 10*3/uL (ref 3.8–10.8)

## 2024-01-19 ENCOUNTER — Other Ambulatory Visit: Payer: Self-pay | Admitting: Pharmacist

## 2024-01-19 ENCOUNTER — Encounter: Payer: Self-pay | Admitting: Pharmacist

## 2024-01-19 ENCOUNTER — Telehealth: Payer: Self-pay

## 2024-01-19 ENCOUNTER — Other Ambulatory Visit: Payer: Self-pay | Admitting: Family Medicine

## 2024-01-19 DIAGNOSIS — I10 Essential (primary) hypertension: Secondary | ICD-10-CM

## 2024-01-19 MED ORDER — LOSARTAN POTASSIUM 50 MG PO TABS: 50.0000 mg | ORAL_TABLET | Freq: Every day | ORAL | 0 refills | Status: DC

## 2024-01-19 NOTE — Progress Notes (Signed)
   01/19/2024  Patient ID: Vanessa Gross, female   DOB: 1956-01-24, 68 y.o.   MRN: [id number]  Receive a referral from PCP requesting outreach to patient related to medication access.  Receive a message from Care Guide requesting sooner outreach to patient as shared that she was unable to pick up/concerned about running out of one of her medications over the weekend.  Outreach to patient by telephone today. Reports that she only has 1 tablet of her olmesartan remaining and that the cost of this medication is unaffordable for her to pick up.  Note patient currently receives her medications from [facility]. Review discounted drug list from [facility]. Note that while olmesartan is not available through this program, therapeutic alternative losartan is available for $9 for 30 day supply. - As appears PCP has left office for the day, collaborate with Dr. Carlynn Purl to ask provider about a switch from olmesartan to losartan 50 mg daily due to cost concern/accessibility. Provider agrees, but recommends patient return to office in 2 weeks for CMA BP recheck visit   Follow up with patient to provide update. States will follow up with [facility] to pick up losartan 50 mg prescription, to start in place of olmesartan, and will come to office in 2 weeks for the recheck as recommended by Dr. Carlynn Purl.  Follow Up Plan: Clinical Pharmacist will follow up with patient by telephone on 02/05/2024 at 8:00 AM   Estelle Grumbles, PharmD, [facility] [phone]

## 2024-01-19 NOTE — Progress Notes (Signed)
 Complex Care Management Note  Care Guide Note 01/19/2024 Name: Vanessa Gross MRN: [id number] DOB: January 07, 1956  Vanessa Gross is a 68 y.o. year old female who sees Berniece Salines, FNP for primary care. I reached out to [facility] by phone today to offer complex care management services.  Ms. Hass was given information about Complex Care Management services today including:   The Complex Care Management services include support from the care team which includes your Nurse Care Manager, Clinical Social Worker, or Pharmacist.  The Complex Care Management team is here to help remove barriers to the health concerns and goals most important to you. Complex Care Management services are voluntary, and the patient may decline or stop services at any time by request to their care team member.   Complex Care Management Consent Status: Patient agreed to services and verbal consent obtained.   Follow up plan:  Telephone appointment with complex care management team member scheduled for:  01/25/2024  Encounter Outcome:  Patient Scheduled  Penne Lash , RMA     [place]  [facility], [facility] Guide  Direct Dial: [phone]  Website: [email or web address]

## 2024-01-25 ENCOUNTER — Encounter: Payer: Self-pay | Admitting: *Deleted

## 2024-01-25 ENCOUNTER — Ambulatory Visit: Payer: Self-pay | Admitting: *Deleted

## 2024-01-25 NOTE — Patient Outreach (Signed)
 Care Coordination   Initial Visit Note   01/25/2024 Name: Vanessa Gross MRN: [id number] DOB: [birth year]  Vanessa Gross is a 68 y.o. year old female who sees Berniece Salines, FNP for primary care. I spoke with  Vanessa Gross by phone today.  What matters to the patients health and wellness today?  Manage anxiety, increase resources to address financial difficulties    Goals Addressed             This Visit's Progress    Decrease anxiety symptoms       Activities and task to complete in order to accomplish goals.   EMOTIONAL / MENTAL HEALTH SUPPORT Keep all upcoming appointment discussed today Continue with compliance of taking medication prescribed by Doctor Self Support options  (continue to reach out to family and friends for emotional support, consider outreach to community agencies for financial help, continue use of coping stragies discussed today to manage symptoms)         SDOH assessments and interventions completed:  Yes  SDOH Interventions Today    Flowsheet Row Most Recent Value  SDOH Interventions   Food Insecurity Interventions Community Resources Provided  Housing Interventions Intervention Not Indicated  Transportation Interventions Intervention Not Indicated  Utilities Interventions Community Resources Provided  [DSS, [employer]]  Depression Interventions/Treatment  Counseling, Medication        Care Coordination Interventions:  Yes, provided  Interventions Today    Flowsheet Row Most Recent Value  Chronic Disease   Chronic disease during today's visit Other  [depression]  General Interventions   General Interventions Discussed/Reviewed General Interventions Discussed  Patent examiner assessed for mental health/community resource needs]  Education Interventions   Education Provided Provided Education  Provided Verbal Education On [employer]  [discussed options for financial supports ie DSS and [employer]]  Mental Health  Interventions   Mental Health Discussed/Reviewed Mental Health Discussed, Anxiety  [Patient assessed for MH needs-confirmed increased anxiety related to financial challenges "trying my best to stay out of depression mode" relies heavily on her spirtirual life for  inspiration-works to manage her expectations and perspective]  Pharmacy Interventions   Pharmacy Dicussed/Reviewed Pharmacy Topics Discussed, Medication Adherence  [active with pharmacy]  Safety Interventions   Safety Discussed/Reviewed Safety Discussed       Follow up plan: Follow up call scheduled for 02/02/24    Encounter Outcome:  Patient Visit Completed

## 2024-01-25 NOTE — Patient Instructions (Signed)
 Visit Information  Thank you for taking time to visit with me today. Please don't hesitate to contact me if I can be of assistance to you.   Following are the goals we discussed today:   Goals Addressed             This Visit's Progress    Decrease anxiety symptoms       Activities and task to complete in order to accomplish goals.   EMOTIONAL / MENTAL HEALTH SUPPORT Keep all upcoming appointment discussed today Continue with compliance of taking medication prescribed by Doctor Self Support options  (continue to reach out to family and friends for emotional support, consider outreach to community agencies for financial help, continue use of coping stragies discussed today to manage symptoms)         Our next appointment is by telephone on 02/02/24 at 9:30am  Please call the care guide team at [phone] if you need to cancel or reschedule your appointment.   If you are experiencing a Mental Health or Behavioral Health Crisis or need someone to talk to, please call 911   Patient verbalizes understanding of instructions and care plan provided today and agrees to view in MyChart. Active MyChart status and patient understanding of how to access instructions and care plan via MyChart confirmed with patient.     Telephone follow up appointment with care management team member scheduled for: 02/02/24  Verna Czech, LCSW [place]  [facility], [facility] Licensed Clinical Social Worker Care Coordinator  Direct Dial: [phone]

## 2024-02-02 ENCOUNTER — Ambulatory Visit: Payer: Self-pay | Admitting: *Deleted

## 2024-02-02 NOTE — Patient Instructions (Signed)
 Visit Information  Thank you for taking time to visit with me today. Please don't hesitate to contact me if I can be of assistance to you.   Following are the goals we discussed today:   Goals Addressed             This Visit's Progress    Decrease anxiety symptoms       Activities and task to complete in order to accomplish goals.   EMOTIONAL / MENTAL HEALTH SUPPORT Keep all upcoming appointments with your medical provider Continue with compliance of taking medication prescribed by Doctor Self Support options  (continue to reach out to family and friends for emotional support, consider outreach to community agencies for financial help, CSW to continue to provide available community resources to address her financial challenges)         Our next appointment is by telephone on 02/23/24 at 1pm  Please call the care guide team at [phone] if you need to cancel or reschedule your appointment.   If you are experiencing a Mental Health or Behavioral Health Crisis or need someone to talk to, please call 911   Patient verbalizes understanding of instructions and care plan provided today and agrees to view in MyChart. Active MyChart status and patient understanding of how to access instructions and care plan via MyChart confirmed with patient.     Telephone follow up appointment with care management team member scheduled for: 02/23/24  Verna Czech, LCSW [place]  [facility], [facility] Licensed Clinical Social Worker Care Coordinator  Direct Dial: [phone]

## 2024-02-02 NOTE — Patient Outreach (Signed)
 Care Coordination   Follow Up Visit Note   02/02/2024 Name: Vanessa Gross MRN: [id number] DOB: 18-Oct-1956  Vanessa Gross is a 68 y.o. year old female who sees Berniece Salines, FNP for primary care. I spoke with  Barnett Hatter by phone today.  What matters to the patients health and wellness today?  Resources to address financial challenges experienced.    Goals Addressed             This Visit's Progress    Decrease anxiety symptoms       Activities and task to complete in order to accomplish goals.   EMOTIONAL / MENTAL HEALTH SUPPORT Keep all upcoming appointments with your medical provider Continue with compliance of taking medication prescribed by Doctor Self Support options  (continue to reach out to family and friends for emotional support, consider outreach to community agencies for financial help, CSW to continue to provide available community resources to address her financial challenges)         SDOH assessments and interventions completed:  No     Care Coordination Interventions:  Yes, provided  Interventions Today    Flowsheet Row Most Recent Value  Chronic Disease   Chronic disease during today's visit Other  [depression related to finances]  General Interventions   General Interventions Discussed/Reviewed General Interventions Reviewed, [employer]  [reviewed available resources to assist with financial challenges]  Education Interventions   Education Provided Provided Education  Provided Verbal Education On [employer]-per patient she has contacted [employer] and is awaiting a return call-CSW will follow up as well]  Mental Health Interventions   Mental Health Discussed/Reviewed Mental Health Discussed  [patient continues to struggle financially, reports feeling "so so" discussed frustration with not receiving return calls for potential support]       Follow up plan: Follow up call scheduled for 02/23/24     Encounter Outcome:  Patient Visit Completed

## 2024-02-05 ENCOUNTER — Other Ambulatory Visit: Payer: Self-pay | Admitting: Nurse Practitioner

## 2024-02-05 ENCOUNTER — Other Ambulatory Visit: Payer: Self-pay | Admitting: Pharmacist

## 2024-02-05 ENCOUNTER — Telehealth: Payer: Self-pay | Admitting: Pharmacist

## 2024-02-05 DIAGNOSIS — I10 Essential (primary) hypertension: Secondary | ICD-10-CM

## 2024-02-05 DIAGNOSIS — J309 Allergic rhinitis, unspecified: Secondary | ICD-10-CM

## 2024-02-05 DIAGNOSIS — J301 Allergic rhinitis due to pollen: Secondary | ICD-10-CM

## 2024-02-05 MED ORDER — AZELASTINE HCL 0.1 % NA SOLN: 2.0000 | Freq: Two times a day (BID) | NASAL | 5 refills | Status: DC

## 2024-02-05 NOTE — Progress Notes (Signed)
 Patient requesting renewal of azelastine nasal spray prescription. Would you please send to [facility] for her?  Thank you!  Gentry Fitz

## 2024-02-05 NOTE — Telephone Encounter (Signed)
 sent

## 2024-02-05 NOTE — Patient Instructions (Signed)
Check your blood pressure once weekly, and any time you have concerning symptoms like headache, chest pain, dizziness, shortness of breath, or vision changes.   Our goal is less than 130/80.  To appropriately check your blood pressure, make sure you do the following:  1) Avoid caffeine, exercise, or tobacco products for 30 minutes before checking. Empty your bladder. 2) Sit with your back supported in a flat-backed chair. Rest your arm on something flat (arm of the chair, table, etc). 3) Sit still with your feet flat on the floor, resting, for at least 5 minutes.  4) Check your blood pressure. Take 1-2 readings.  5) Write down these readings and bring with you to any provider appointments.  Bring your home blood pressure machine with you to a provider's office for accuracy comparison at least once a year.   Make sure you take your blood pressure medications before you come to any office visit, even if you were asked to fast for labs.  Eileen Kangas Ka Bench, PharmD, BCACP [facility] [phone]  

## 2024-02-05 NOTE — Progress Notes (Signed)
 02/05/2024 Name: Vanessa Gross MRN: [id number] DOB: 03-Apr-1956  Chief Complaint  Patient presents with   Medication Access    Vanessa Gross is a 68 y.o. year old female who presented for a telephone visit.   They were referred to the pharmacist by their PCP for assistance in managing medication access.    Subjective:  Care Team: Primary Care Provider: Berniece Salines, FNP ; Next Scheduled Visit: 07/19/2024 Social Worker: Verna Czech [place], [place]; Next Scheduled Visit: 02/23/2024   Medication Access/Adherence  Current Pharmacy:  [facility] [address] ([address]), [place] - [address] [place]) [place] [address] Phone: [phone] Fax: [phone]   Patient reports affordability concerns with their medications: Yes  Patient reports access/transportation concerns to their pharmacy: No  Patient reports adherence concerns with their medications:  No    Reports having significant financial strain. Patient engaging with LCSW Chrystal Land  Today reports needs her azelastine nasal spray to help control her allergy symptoms, but copayment is unaffordable.  Hypertension:  Current medications:  - losartan 50 mg daily - triamterene-HCTZ 37.5-25 mg daily   Patient does not have a validated, automated, upper arm home BP cuff Denies monitoring home BP  Patient denies hypotensive s/sx including dizziness, lightheadedness.    Objective:   Lab Results  Component Value Date   CREATININE 0.98 01/17/2024   BUN 14 01/17/2024   NA 142 01/17/2024   K 5.1 01/17/2024   CL 104 01/17/2024   CO2 30 01/17/2024    BP Readings from Last 3 Encounters:  01/17/24 122/76  06/20/23 136/82  11/03/22 132/74   Pulse Readings from Last 3 Encounters:  01/17/24 82  06/20/23 95  11/03/22 96     Medications Reviewed Today     Reviewed by Manuela Neptune, RPH-CPP (Pharmacist) on 02/05/24 at 0830  Med List Status: <None>   Medication  Order Taking? Sig Documenting Provider Last Dose Status Informant  aspirin EC 81 MG tablet [id number]  Take 1 tablet (81 mg total) by mouth daily. Kerman Passey, MD  Active   azelastine (ASTELIN) 0.1 % nasal spray [id number] Yes Place 2 sprays into both nostrils 2 (two) times daily. Use in each nostril as directed [provider]  Active   loratadine (EQ ALL DAY ALLERGY RELIEF) 10 MG tablet [id number] Yes Take 1 tablet (10 mg total) by mouth daily. Berniece Salines, FNP Taking Active   losartan (COZAAR) 50 MG tablet [id number] Yes Take 1 tablet (50 mg total) by mouth daily. Alba Cory, MD Taking Active   Multiple Vitamin (MULTI-VITAMINS) TABS [id number]  Take by mouth. [provider]  Active            Med Note Dorrene German Oct 19, 2015  3:31 PM) Received from: [facility]  nystatin (MYCOSTATIN/NYSTOP) powder [id number]  Apply 1 Application topically 3 (three) times daily. Margarita Mail, DO  Active   triamterene-hydrochlorothiazide [facility]) 37.5-25 MG tablet [id number] Yes Take 1 tablet by mouth daily. Berniece Salines, FNP Taking Active               Assessment/Plan:   Counsel patient on option of using GoodRx coupon for cost savings on azelastine nasal spray prescription  Send message to clinical team requesting renewal of azelastine nasal spray prescription be sent to [facility] for patient  Hypertension: - Reviewed long term cardiovascular and renal outcomes of uncontrolled blood pressure - Again recommend  patient follow up with office for CMA BP recheck as recommended by Dr. Carlynn Purl    Follow Up Plan: Clinical Pharmacist will follow up with patient by telephone on 03/04/2024 at 8:30 AM   Estelle Grumbles, PharmD, [facility] [phone]

## 2024-02-15 ENCOUNTER — Other Ambulatory Visit: Payer: Self-pay

## 2024-02-15 ENCOUNTER — Telehealth: Payer: Self-pay | Admitting: *Deleted

## 2024-02-16 NOTE — Patient Instructions (Signed)
 Visit Information  Thank you for taking time to visit with me today. Please don't hesitate to contact me if I can be of assistance to you before our next scheduled appointment.  Our next appointment is by telephone on 03/06/24 at 1pm Please call the care guide team at [phone] if you need to cancel or reschedule your appointment.   Following is a copy of your care plan:   Goals Addressed             This Visit's Progress    COMPLETED: Find Help in My Community       Timeframe:  Short-Term Goal Priority:  High Start Date:       04/14/21                      Expected End Date:   05/14/21                    Follow Up Date 05/07/21   - follow-up on any referrals for help I am given - think ahead to make sure my need does not become an emergency - have a back-up plan  -continue to follow up with insurance agent regarding insurance coverage and options   Why is this important?   Knowing how and where to find help for yourself or family in your neighborhood and community is an important skill.  You will want to take some steps to learn how.    Notes:      VBCI Social Work Care Plan       Problems:   Disease Management support and education needs related to Anxiety with Excessive Worry, related to financial strain  CSW Clinical Goal(s):   Over the next 90  days  the Patient will work with Child psychotherapist and community agencies to address needs related to financial strain .  Interventions:  Social Determinants of Health in Patient with Anxiety: SDOH assessments completed: Financial Strain  Evaluation of current treatment plan related to unmet needs Discussed reaching out to [employer] 211, [employer] Program(declined referral) [employer]  [phone]  (accepting applications May 1) [facility]  [phone], [facility] [phone] ,  Mental Health:  Evaluation of current treatment plan related to Anxiety  with Excessive Worry, Active listening / Reflection utilized Financial risk analyst / information provided Emotional Support Provided Solution-Focued Strategies employed:  Patient Goals/Self-Care Activities:  Solicitor community resources provided to discuss any available options for financial support  Plan:   Telephone follow up appointment with care management team member scheduled for:  03/06/24        Please call the Suicide and Crisis Lifeline: 988 if you are experiencing a Mental Health or Behavioral Health Crisis or need someone to talk to.  Patient verbalizes understanding of instructions and care plan provided today and agrees to view in MyChart. Active MyChart status and patient understanding of how to access instructions and care plan via MyChart confirmed with patient.     Ekin Pilar, LCSW [place]  [facility], [facility] Licensed Clinical Social Worker Care Coordinator  Direct Dial: [phone]

## 2024-02-16 NOTE — Patient Outreach (Addendum)
 Complex Care Management   Visit Note  02/16/2024  Name:  Vanessa Gross MRN: [id number] DOB: October 17, 1956  Situation: Referral received for Complex Care Management related to Anxiety and SDOH Barriers:  Financial Resource Strain I obtained verbal consent from Parent.  Patient confirms continued financial strain due to recent change in income. Visit completed with patient  on the phone  Background:   Past Medical History:  Diagnosis Date   Alopecia 05/29/2015   Benign paroxysmal positional vertigo    bilateral   BPPV (benign paroxysmal positional vertigo) 12/25/2016   Chronic radicular lumbar pain    Controlled substance agreement signed 05/16/2017   May 16, 2017   Hair loss    HTN, goal below 140/90    Hyperlipidemia LDL goal <100    Obesity, Class I, BMI 30.0-34.9 (see actual BMI)    Reflux esophagitis 05/29/2015   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     Assessment: Patient Reported Symptoms:  Cognitive Cognitive Status: Alert and oriented to person, place, and time, Insightful and able to interpret abstract concepts Cognitive/Intellectual Conditions Management [RPT]: None reported or documented in medical history or problem list      Neurological      HEENT HEENT Symptoms Reported: Not assessed      Cardiovascular Cardiovascular Symptoms Reported: No symptoms reported    Respiratory Respiratory Symptoms Reported: No symptoms reported    Endocrine Patient reports the following symptoms related to hypoglycemia or hyperglycemia : No symptoms reported Is patient diabetic?: No    Gastrointestinal Gastrointestinal Symptoms Reported: No symptoms reported      Genitourinary      Integumentary Integumentary Symptoms Reported: No symptoms reported    Musculoskeletal Musculoskelatal Symptoms Reviewed: No symptoms reported        Psychosocial Psychosocial Symptoms Reported: Depression - if selected complete PHQ 2-9, Anxiety - if selected  complete GAD Behavioral Health Conditions: Anxiety, Depression Behavioral Management Strategies: Coping strategies Techniques to Cope with Loss/Stress/Change: Diversional activities, Spiritual practice(s) Quality of Family Relationships: supportive Do you feel physically threatened by others?: No      02/15/2024    3:38 PM  Depression screen PHQ 2/9  Decreased Interest 2  Down, Depressed, Hopeless 2  PHQ - 2 Score 4  Altered sleeping 3  Tired, decreased energy 0  Change in appetite 1  Feeling bad or failure about yourself  2  Trouble concentrating 1  Moving slowly or fidgety/restless 0  Suicidal thoughts 0  PHQ-9 Score 11    There were no vitals filed for this visit.  Medications Reviewed Today     Reviewed by Wenda Overland, LCSW (Social Worker) on 02/16/24 at 1349  Med List Status: <None>   Medication Order Taking? Sig Documenting Provider Last Dose Status Informant  aspirin EC 81 MG tablet [id number] Yes Take 1 tablet (81 mg total) by mouth daily. Kerman Passey, MD Taking Active   azelastine (ASTELIN) 0.1 % nasal spray [id number] No Place 2 sprays into both nostrils 2 (two) times daily. Use in each nostril as directed  Patient not taking: Reported on 02/15/2024   Berniece Salines, FNP Not Taking Active   loratadine (EQ ALL DAY ALLERGY RELIEF) 10 MG tablet [id number] Yes Take 1 tablet (10 mg total) by mouth daily. Berniece Salines, FNP Taking Active   losartan (COZAAR) 50 MG tablet [id number] No Take 1 tablet (50 mg total) by mouth daily.  Patient not taking: Reported on 02/15/2024   Alba Cory, MD  Not Taking Active   Multiple Vitamin (MULTI-VITAMINS) TABS [id number] No Take by mouth.  Patient not taking: Reported on 02/15/2024   [provider] Not Taking Active            Med Note Dorrene German Oct 19, 2015  3:31 PM) Received from: [facility]  nystatin (MYCOSTATIN/NYSTOP) powder [id number] No Apply 1 Application topically 3 (three)  times daily.  Patient not taking: Reported on 02/15/2024   Margarita Mail, DO Not Taking Active   triamterene-hydrochlorothiazide (MAXZIDE-25) 37.5-25 MG tablet [id number] No Take 1 tablet by mouth daily.  Patient not taking: Reported on 02/15/2024   Berniece Salines, FNP Not Taking Active             Recommendation:   Patient to contact community agencies provided to request any available financial assistance   Follow Up Plan:   Telephone follow-up 03/07/23  Verna Czech, LCSW [place]  [facility], [facility] Licensed Clinical Social Worker Care Coordinator  Direct Dial: [phone]

## 2024-02-23 ENCOUNTER — Ambulatory Visit: Payer: PRIVATE HEALTH INSURANCE | Admitting: *Deleted

## 2024-02-23 NOTE — Patient Instructions (Signed)
 Visit Information  Thank you for taking time to visit with me today. Please don't hesitate to contact me if I can be of assistance to you before our next scheduled appointment.  Patient has met all care management goals. Care Management case will be closed. Patient has been provided contact information should new needs arise.   Please call the care guide team at [phone] if you need to cancel, schedule, or reschedule an appointment.   Please call 911 if you are experiencing a Mental Health or Behavioral Health Crisis or need someone to talk to.   Trayce Maino, LCSW [place]  [facility], [facility] Licensed Clinical Social Worker Care Coordinator  Direct Dial: [phone]

## 2024-02-23 NOTE — Patient Outreach (Signed)
 Complex Care Management   Visit Note  02/23/2024  Name:  Vanessa Gross MRN: [id number] DOB: 18-Jan-1956  Situation: Referral received for Complex Care Management related to SDOH Barriers:  Financial Resource Strain I obtained verbal consent from Patient.  Visit completed with Patient  on the phone  Background:   Past Medical History:  Diagnosis Date   Alopecia 05/29/2015   Benign paroxysmal positional vertigo    bilateral   BPPV (benign paroxysmal positional vertigo) 12/25/2016   Chronic radicular lumbar pain    Controlled substance agreement signed 05/16/2017   May 16, 2017   Hair loss    HTN, goal below 140/90    Hyperlipidemia LDL goal <100    Obesity, Class I, BMI 30.0-34.9 (see actual BMI)    Reflux esophagitis 05/29/2015   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     Assessment: Patient Reported Symptoms:  Cognitive Cognitive Status: Alert and oriented to person, place, and time, Insightful and able to interpret abstract concepts, Normal speech and language skills Cognitive/Intellectual Conditions Management [RPT]: None reported or documented in medical history or problem list   Health Maintenance Behaviors: None  Neurological Neurological Review of Symptoms: No symptoms reported    HEENT HEENT Symptoms Reported: No symptoms reported      Cardiovascular      Respiratory Respiratory Symptoms Reported: No symptoms reported Respiratory Conditions: Seasonal allergies Respiratory Comment: taking allergy medications  Endocrine Patient reports the following symptoms related to hypoglycemia or hyperglycemia : No symptoms reported Is patient diabetic?: No    Gastrointestinal Gastrointestinal Symptoms Reported: Abdominal pain or discomfort, Diarrhea Additional Gastrointestinal Details: due to anxiety-per patient Gastrointestinal Conditions: Diarrhea Gastrointestinal Management Strategies: Adequate rest, Coping strategies Nutrition Risk Screen (CP):  No indicators present  Genitourinary Genitourinary Symptoms Reported: No symptoms reported    Integumentary Integumentary Symptoms Reported: No symptoms reported    Musculoskeletal          Psychosocial Psychosocial Symptoms Reported: Anxiety - if selected complete GAD, Depression - if selected complete PHQ 2-9 Behavioral Health Conditions: Anxiety, Depression Behavioral Management Strategies: Coping strategies Major Change/Loss/Stressor/Fears (CP): Resources Techniques to Cope with Loss/Stress/Change: Diversional activities, Spiritual practice(s) Quality of Family Relationships: supportive Do you feel physically threatened by others?: No      02/23/2024    1:39 PM  Depression screen PHQ 2/9  Decreased Interest 2  Down, Depressed, Hopeless 2  PHQ - 2 Score 4  Altered sleeping 2  Tired, decreased energy 1  Change in appetite 1  Feeling bad or failure about yourself  2  Trouble concentrating 1  Moving slowly or fidgety/restless 0  Suicidal thoughts 0  PHQ-9 Score 11    There were no vitals filed for this visit.  Medications Reviewed Today     Reviewed by Ave Leisure, LCSW (Social Worker) on 02/23/24 at 1333  Med List Status: <None>   Medication Order Taking? Sig Documenting Provider Last Dose Status Informant  aspirin  EC 81 MG tablet [id number] Yes Take 1 tablet (81 mg total) by mouth daily. Lark Plum, MD Taking Active   azelastine  (ASTELIN ) 0.1 % nasal spray [id number] No Place 2 sprays into both nostrils 2 (two) times daily. Use in each nostril as directed  Patient not taking: Reported on 02/23/2024   Quinton Buckler, FNP Not Taking Active   loratadine  (EQ ALL DAY ALLERGY RELIEF) 10 MG tablet [id number] Yes Take 1 tablet (10 mg total) by mouth daily. Quinton Buckler, FNP Taking Active  losartan  (COZAAR ) 50 MG tablet [id number] Yes Take 1 tablet (50 mg total) by mouth daily. Sowles, Krichna, MD Taking Active   Multiple Vitamin (MULTI-VITAMINS) TABS [id number] Yes  Take by mouth. [provider] Taking Active            Med Note Vania Genin Oct 19, 2015  3:31 PM) Received from: [facility]  nystatin  (MYCOSTATIN /NYSTOP ) powder [id number] No Apply 1 Application topically 3 (three) times daily.  Patient not taking: Reported on 02/23/2024   Rockney Cid, DO Not Taking Active   triamterene -hydrochlorothiazide  (MAXZIDE-25) 37.5-25 MG tablet [id number] Yes Take 1 tablet by mouth daily. Quinton Buckler, FNP Taking Active             Recommendation:   No additional recommendations identified at this time. Patient to follow up on resources provided to address financial strain.  Follow Up Plan:   Patient has met all care management goals. Care Management case will be closed. Patient has been provided contact information should new needs arise.   Keyna Blizard, LCSW [place]  [facility], [facility] Licensed Clinical Social Worker Care Coordinator  Direct Dial: [phone]

## 2024-03-04 ENCOUNTER — Other Ambulatory Visit: Payer: PRIVATE HEALTH INSURANCE | Admitting: Pharmacist

## 2024-03-04 DIAGNOSIS — I10 Essential (primary) hypertension: Secondary | ICD-10-CM

## 2024-03-04 NOTE — Patient Instructions (Signed)
Check your blood pressure twice weekly, and any time you have concerning symptoms like headache, chest pain, dizziness, shortness of breath, or vision changes.   Our goal is less than 130/80.  To appropriately check your blood pressure, make sure you do the following:  1) Avoid caffeine, exercise, or tobacco products for 30 minutes before checking. Empty your bladder. 2) Sit with your back supported in a flat-backed chair. Rest your arm on something flat (arm of the chair, table, etc). 3) Sit still with your feet flat on the floor, resting, for at least 5 minutes.  4) Check your blood pressure. Take 1-2 readings.  5) Write down these readings and bring with you to any provider appointments.  Bring your home blood pressure machine with you to a provider's office for accuracy comparison at least once a year.   Make sure you take your blood pressure medications before you come to any office visit, even if you were asked to fast for labs.  Estelle Grumbles, PharmD, [facility] [phone]

## 2024-03-04 NOTE — Progress Notes (Signed)
   03/04/2024 Name: Vanessa Gross MRN: [id number] DOB: [birth year]  Chief Complaint  Patient presents with   Medication Management    [place] EDELYNN HYDER is a 68 y.o. year old female who presented for a telephone visit.   They were referred to the pharmacist by their PCP for assistance in managing medication access.      Subjective:   Care Team: Primary Care Provider: Quinton Buckler, FNP ; Next Scheduled Visit: 07/19/2024 Social Worker: Michaelle Adolphus [place], [place]; Next Scheduled Visit: 03/06/2024     Medication Access/Adherence  Current Pharmacy:  [facility] [address] ([address]), [place] - [address] [place]) [place] [address] Phone: [phone] Fax: [phone]   Patient reports affordability concerns with their medications: Yes  Patient reports access/transportation concerns to their pharmacy: No  Patient reports adherence concerns with their medications:  No     Denies having yet come by office for BP recheck as recommended by Dr. Ava Lei   Hypertension:   Current medications:  - losartan  50 mg daily - triamterene -HCTZ 37.5-25 mg daily     Reports thinks that she has an upper arm BP monitor at home, but denies monitoring recently  Reports feels like the swelling in her legs may be increased compared to when she was on olmesartan    Objective:   Lab Results  Component Value Date   CREATININE 0.98 01/17/2024   BUN 14 01/17/2024   NA 142 01/17/2024   K 5.1 01/17/2024   CL 104 01/17/2024   CO2 30 01/17/2024   BP Readings from Last 3 Encounters:  01/17/24 122/76  06/20/23 136/82  11/03/22 132/74   Pulse Readings from Last 3 Encounters:  01/17/24 82  06/20/23 95  11/03/22 96      Current Outpatient Medications on File Prior to Visit  Medication Sig Dispense Refill   aspirin  EC 81 MG tablet Take 1 tablet (81 mg total) by mouth daily.     azelastine  (ASTELIN ) 0.1 % nasal spray Place 2 sprays into both nostrils 2  (two) times daily. Use in each nostril as directed (Patient not taking: Reported on 02/23/2024) 30 mL 5   loratadine  (EQ ALL DAY ALLERGY RELIEF) 10 MG tablet Take 1 tablet (10 mg total) by mouth daily. 90 tablet 3   losartan  (COZAAR ) 50 MG tablet Take 1 tablet (50 mg total) by mouth daily. 90 tablet 0   Multiple Vitamin (MULTI-VITAMINS) TABS Take by mouth.     nystatin  (MYCOSTATIN /NYSTOP ) powder Apply 1 Application topically 3 (three) times daily. (Patient not taking: Reported on 02/23/2024) 15 g 0   triamterene -hydrochlorothiazide  (MAXZIDE-25) 37.5-25 MG tablet Take 1 tablet by mouth daily. 90 tablet 3   No current facility-administered medications on file prior to visit.        Assessment/Plan:     Hypertension: - Reviewed long term cardiovascular and renal outcomes of uncontrolled blood pressure - Again recommend patient follow up with office for BP recheck as recommended by Dr. Suzie Estelle have her legs evaluated       Follow Up Plan:   Patient denies further medication questions or concerns today Provide patient with contact information for clinic pharmacist to contact if needed in future for medication questions/concerns    Arthur Lash, PharmD, [facility] [phone]

## 2024-03-06 ENCOUNTER — Telehealth: Payer: PRIVATE HEALTH INSURANCE | Admitting: *Deleted

## 2024-06-18 ENCOUNTER — Other Ambulatory Visit: Payer: Self-pay | Admitting: Nurse Practitioner

## 2024-06-18 ENCOUNTER — Telehealth: Payer: Self-pay

## 2024-06-18 NOTE — Telephone Encounter (Signed)
 Pt states she does not take losartan .  She states is currently on Triamterene -hctz and olmesartan .  Needs refills on the olmestartan?

## 2024-06-18 NOTE — Telephone Encounter (Signed)
 Copied from CRM [id number]. Topic: Clinical - Prescription Issue >> Jun 18, 2024  3:39 PM Selinda RAMAN wrote: Reason for CRM: The patient called in stating she had stopped taking the triamterene -hydrochlorothiazide  (MAXZIDE-25) 37.5-25 MG tablet because she said it wasn't helping her and she was still retaining fluid. She states she still had a refill of her olmesartan  (BENICAR ) 20 MG tablet at the time and put herself back on that stating it seemed to help her better. She called her pharmacy today to tell them she only has 2 pills left because she thought she had an additional refill on it even thought it had been discontinued. Please assist patient as soon as possible.

## 2024-06-19 ENCOUNTER — Other Ambulatory Visit: Payer: Self-pay | Admitting: Nurse Practitioner

## 2024-06-19 DIAGNOSIS — I10 Essential (primary) hypertension: Secondary | ICD-10-CM

## 2024-06-19 MED ORDER — OLMESARTAN MEDOXOMIL 20 MG PO TABS: 20.0000 mg | ORAL_TABLET | Freq: Every day | ORAL | 1 refills | Status: DC

## 2024-06-21 NOTE — Telephone Encounter (Signed)
 Requested Prescriptions  Refused Prescriptions Disp Refills   olmesartan  (BENICAR ) 20 MG tablet [Pharmacy Med Name: Olmesartan  Medoxomil 20 MG Oral Tablet] 90 tablet 0    Sig: Take 1 tablet by mouth once daily     Cardiovascular:  Angiotensin Receptor Blockers Passed - 06/21/2024 11:59 AM      Passed - Cr in normal range and within 180 days    Creat  Date Value Ref Range Status  01/17/2024 0.98 0.50 - 1.05 mg/dL Final         Passed - K in normal range and within 180 days    Potassium  Date Value Ref Range Status  01/17/2024 5.1 3.5 - 5.3 mmol/L Final         Passed - Patient is not pregnant      Passed - Last BP in normal range    BP Readings from Last 1 Encounters:  01/17/24 122/76         Passed - Valid encounter within last 6 months    Recent Outpatient Visits           5 months ago Essential hypertension, benign   [facility] Gareth Mliss FALCON, FNP       Future Appointments             In 4 weeks Gareth, Mliss FALCON, FNP [facility], [facility]

## 2024-07-19 ENCOUNTER — Ambulatory Visit: Payer: PRIVATE HEALTH INSURANCE | Admitting: Nurse Practitioner

## 2024-07-19 ENCOUNTER — Telehealth: Payer: Self-pay

## 2024-07-19 ENCOUNTER — Encounter: Payer: Self-pay | Admitting: Nurse Practitioner

## 2024-07-19 VITALS — BP 136/82 | HR 70 | Resp 16 | Ht 62.0 in | Wt 220.2 lb

## 2024-07-19 DIAGNOSIS — E782 Mixed hyperlipidemia: Secondary | ICD-10-CM

## 2024-07-19 DIAGNOSIS — N951 Menopausal and female climacteric states: Secondary | ICD-10-CM

## 2024-07-19 DIAGNOSIS — L819 Disorder of pigmentation, unspecified: Secondary | ICD-10-CM

## 2024-07-19 DIAGNOSIS — Z1231 Encounter for screening mammogram for malignant neoplasm of breast: Secondary | ICD-10-CM

## 2024-07-19 DIAGNOSIS — R718 Other abnormality of red blood cells: Secondary | ICD-10-CM

## 2024-07-19 DIAGNOSIS — I10 Essential (primary) hypertension: Secondary | ICD-10-CM

## 2024-07-19 DIAGNOSIS — Z130 Encounter for screening for diseases of the blood and blood-forming organs and certain disorders involving the immune mechanism: Secondary | ICD-10-CM

## 2024-07-19 DIAGNOSIS — Z1211 Encounter for screening for malignant neoplasm of colon: Secondary | ICD-10-CM

## 2024-07-19 DIAGNOSIS — E66812 Obesity, class 2: Secondary | ICD-10-CM

## 2024-07-19 DIAGNOSIS — Z6836 Body mass index (BMI) 36.0-36.9, adult: Secondary | ICD-10-CM

## 2024-07-19 DIAGNOSIS — Z131 Encounter for screening for diabetes mellitus: Secondary | ICD-10-CM

## 2024-07-19 DIAGNOSIS — F419 Anxiety disorder, unspecified: Secondary | ICD-10-CM

## 2024-07-19 DIAGNOSIS — H1013 Acute atopic conjunctivitis, bilateral: Secondary | ICD-10-CM | POA: Diagnosis not present

## 2024-07-19 DIAGNOSIS — J301 Allergic rhinitis due to pollen: Secondary | ICD-10-CM

## 2024-07-19 DIAGNOSIS — Z13820 Encounter for screening for osteoporosis: Secondary | ICD-10-CM

## 2024-07-19 DIAGNOSIS — J309 Allergic rhinitis, unspecified: Secondary | ICD-10-CM

## 2024-07-19 DIAGNOSIS — F330 Major depressive disorder, recurrent, mild: Secondary | ICD-10-CM

## 2024-07-19 MED ORDER — OLMESARTAN MEDOXOMIL 20 MG PO TABS
20.0000 mg | ORAL_TABLET | Freq: Every day | ORAL | 1 refills | Status: AC
Start: 2024-07-19 — End: ?

## 2024-07-19 MED ORDER — LORATADINE 10 MG PO TABS
10.0000 mg | ORAL_TABLET | Freq: Every day | ORAL | 3 refills | Status: AC
Start: 2024-07-19 — End: ?

## 2024-07-19 MED ORDER — TRIAMTERENE-HCTZ 37.5-25 MG PO TABS: 1.0000 | ORAL_TABLET | Freq: Every day | ORAL | 1 refills | Status: AC

## 2024-07-19 NOTE — Telephone Encounter (Signed)
 Copied from CRM [id number]. Topic: Clinical - Medication Question >> Jul 19, 2024 11:25 AM Tinnie C wrote: Reason for CRM: Pt calling to see if we are able to put in a prescription for a vitamin for her iron to: [facility] [address] ([address]), [place] - [address] [place] [place] ([address]) [place] [address] Phone: [phone] Fax: [phone] Hours: Not open 24 hours  Patient can be reached at [phone] with any questions.

## 2024-07-19 NOTE — Telephone Encounter (Signed)
 Pt.notified

## 2024-07-19 NOTE — Progress Notes (Signed)
 BP 136/82   Pulse 70   Resp 16   Ht 5' 2 (1.575 m)   Wt 220 lb 3.2 oz (99.9 kg)   BMI 40.28 kg/m    Subjective:    Patient ID: Vanessa Gross, female    DOB: [birth year], 68 y.o.   MRN: [id number]  HPI: Vanessa Gross is a 68 y.o. female  Chief Complaint  Patient presents with   Medical Management of Chronic Issues   Discussed the use of AI scribe software for clinical note transcription with the patient, who gave verbal consent to proceed.  History of Present Illness Vanessa Gross is a 68 year old female who presents for a six-month follow-up.  Mood disturbance and anxiety - patient reports she is doing well -positive PHQ9 and GAD scores  Dyslipidemia and statin intolerance - Elevated triglycerides at 320 mg/dL and LDL at 824 mg/dL on last lipid panel - Intolerance to atorvastatin  and declined rosuvastatin  due to concern for side effects - Concerned about side effects of cholesterol medications - Currently taking aspirin  81 mg daily  Obesity and appetite management - Weight 220 pounds with BMI of 40.28 - Desires appetite control and is considering protein shakes as a potential option  Vasomotor symptoms - Experiencing hot flashes - Considering natural remedies for symptom management  Eyelid inflammation - Ongoing eyelid issues suspected to be inflammatory in nature - Seeking dermatology evaluation but has encountered scheduling difficulties  Microcytic anemia - History of low MCV and MCH  Hypertension -136/82 today -denies chest pain, shortness of breath, headaches or blurred vision -taking olmesartan  20 mg daily and triamterene -hydrochlorothiazide  37.5 -25 mg daily    Flowsheet Row Office Visit from 07/19/2024 in [facility]  1 40 inches   Body mass index is 40.28 kg/m.  .    07/19/2024    8:24 AM 02/23/2024    1:39 PM 02/15/2024    3:38 PM  Depression screen PHQ 2/9  Decreased Interest 1 2 2   Down, Depressed, Hopeless  1 2 2   PHQ - 2 Score 2 4 4   Altered sleeping 1 2 3   Tired, decreased energy 1 1 0  Change in appetite 1 1 1   Feeling bad or failure about yourself  0 2 2  Trouble concentrating 1 1 1   Moving slowly or fidgety/restless 0 0 0  Suicidal thoughts 0 0 0  PHQ-9 Score 6 11 11   Difficult doing work/chores Somewhat difficult         07/19/2024    8:24 AM 02/23/2024    1:40 PM 02/15/2024    3:39 PM 01/17/2024    9:27 AM  GAD 7 : Generalized Anxiety Score  Nervous, Anxious, on Edge 2 3 3 3   Control/stop worrying 2 3 3 3   Worry too much - different things 2 3 3 3   Trouble relaxing 2 3 3 3   Restless 2 2 3 3   Easily annoyed or irritable 2 3 3 3   Afraid - awful might happen 2 1 3 3   Total GAD 7 Score 14 18 21 21   Anxiety Difficulty Somewhat difficult Very difficult  Very difficult     Relevant past medical, surgical, family and social history reviewed and updated as indicated. Interim medical history since our last visit reviewed. Allergies and medications reviewed and updated.  Review of Systems  Constitutional: Negative for fever or weight change.  Respiratory: Negative for cough and shortness of breath.   Cardiovascular: Negative for chest pain  or palpitations.  Gastrointestinal: Negative for abdominal pain, no bowel changes.  Musculoskeletal: Negative for gait problem or joint swelling.  Skin: Negative for rash.  Neurological: Negative for dizziness or headache.  No other specific complaints in a complete review of systems (except as listed in HPI above).      Objective:      BP 136/82   Pulse 70   Resp 16   Ht 5' 2 (1.575 m)   Wt 220 lb 3.2 oz (99.9 kg)   BMI 40.28 kg/m    Wt Readings from Last 3 Encounters:  07/19/24 220 lb 3.2 oz (99.9 kg)  01/17/24 213 lb 3.2 oz (96.7 kg)  06/20/23 215 lb 8 oz (97.8 kg)    Physical Exam VITALS: BP- 136/82 MEASUREMENTS: Weight- 220, BMI- 40.28. GENERAL: Alert, cooperative, well developed, no acute distress. HEENT: Normocephalic,  normal oropharynx, moist mucous membranes. CHEST: Clear to auscultation bilaterally, no wheezes, rhonchi, or crackles. CARDIOVASCULAR: Regular rate and rhythm, normal heart sounds, S1 and S2 normal without murmurs. ABDOMEN: Soft, non-tender, non-distended, without organomegaly, normal bowel sounds. EXTREMITIES: No cyanosis or edema. NEUROLOGICAL: Cranial nerves grossly intact, moves all extremities without gross motor or sensory deficit.  Results for orders placed or performed in visit on 01/17/24  CBC with Differential/Platelet   Collection Time: 01/17/24  9:41 AM  Result Value Ref Range   WBC 5.5 3.8 - 10.8 Thousand/uL   RBC 6.50 (H) 3.80 - 5.10 Million/uL   Hemoglobin 13.4 11.7 - 15.5 g/dL   HCT 55.3 64.9 - 54.9 %   MCV 68.6 (L) 80.0 - 100.0 fL   MCH 20.6 (L) 27.0 - 33.0 pg   MCHC 30.0 (L) 32.0 - 36.0 g/dL   RDW 83.2 (H) 88.9 - 84.9 %   Platelets 316 140 - 400 Thousand/uL   MPV 10.6 7.5 - 12.5 fL   Neutro Abs 2,382 1,500 - 7,800 cells/uL   Absolute Lymphocytes 2,343 850 - 3,900 cells/uL   Absolute Monocytes 600 200 - 950 cells/uL   Eosinophils Absolute 88 15 - 500 cells/uL   Basophils Absolute 88 0 - 200 cells/uL   Neutrophils Relative % 43.3 %   Total Lymphocyte 42.6 %   Monocytes Relative 10.9 %   Eosinophils Relative 1.6 %   Basophils Relative 1.6 %   Smear Review    COMPLETE METABOLIC PANEL WITH GFR   Collection Time: 01/17/24  9:41 AM  Result Value Ref Range   Glucose, Bld 77 65 - 99 mg/dL   BUN 14 7 - 25 mg/dL   Creat 9.01 9.49 - 8.94 mg/dL   eGFR 64 > OR = 60 fO/fpw/8.26f7   BUN/Creatinine Ratio SEE NOTE: 6 - 22 (calc)   Sodium 142 135 - 146 mmol/L   Potassium 5.1 3.5 - 5.3 mmol/L   Chloride 104 98 - 110 mmol/L   CO2 30 20 - 32 mmol/L   Calcium  9.6 8.6 - 10.4 mg/dL   Total Protein 6.9 6.1 - 8.1 g/dL   Albumin 4.4 3.6 - 5.1 g/dL   Globulin 2.5 1.9 - 3.7 g/dL (calc)   AG Ratio 1.8 1.0 - 2.5 (calc)   Total Bilirubin 0.3 0.2 - 1.2 mg/dL   Alkaline phosphatase  (APISO) 62 37 - 153 U/L   AST 18 10 - 35 U/L   ALT 31 (H) 6 - 29 U/L  Lipid panel   Collection Time: 01/17/24  9:41 AM  Result Value Ref Range   Cholesterol 266 (H) <200 mg/dL  HDL 37 (L) > OR = 50 mg/dL   Triglycerides 679 (H) <150 mg/dL   LDL Cholesterol (Calc) 175 (H) mg/dL (calc)   Total CHOL/HDL Ratio 7.2 (H) <5.0 (calc)   Non-HDL Cholesterol (Calc) 229 (H) <130 mg/dL (calc)          Assessment & Plan:   Problem List Items Addressed This Visit       Cardiovascular and Mediastinum   Essential hypertension, benign - Primary (Chronic)   Relevant Medications   triamterene -hydrochlorothiazide  (MAXZIDE-25) 37.5-25 MG tablet   olmesartan  (BENICAR ) 20 MG tablet   Other Relevant Orders   CBC with Differential/Platelet   Comprehensive metabolic panel with GFR     Respiratory   Allergic conjunctivitis and rhinitis   Seasonal allergic rhinitis due to pollen   Relevant Medications   loratadine  (EQ ALL DAY ALLERGY RELIEF) 10 MG tablet     Other   Anxiety   HLD (hyperlipidemia)   Relevant Medications   triamterene -hydrochlorothiazide  (MAXZIDE-25) 37.5-25 MG tablet   olmesartan  (BENICAR ) 20 MG tablet   Other Relevant Orders   Lipid panel   Class 2 severe obesity with serious comorbidity and body mass index (BMI) of 36.0 to 36.9 in adult, unspecified obesity type (HCC)   Mild episode of recurrent major depressive disorder (HCC)   Other Visit Diagnoses       Screening for diabetes mellitus       Relevant Orders   Comprehensive metabolic panel with GFR   Hemoglobin A1c     Screening for deficiency anemia       Relevant Orders   CBC with Differential/Platelet   Iron, TIBC and Ferritin Panel     Screening for colon cancer       Relevant Orders   Ambulatory referral to Gastroenterology     Encounter for screening mammogram for malignant neoplasm of breast       Relevant Orders   MM 3D SCREENING MAMMOGRAM BILATERAL BREAST     Encounter for osteoporosis screening in  asymptomatic postmenopausal patient       Relevant Orders   DG Bone Density     Discoloration of skin of eyelid       Relevant Orders   Ambulatory referral to Dermatology     Hot flashes due to menopause       Relevant Medications   triamterene -hydrochlorothiazide  (MAXZIDE-25) 37.5-25 MG tablet   olmesartan  (BENICAR ) 20 MG tablet        Assessment and Plan Assessment & Plan Essential hypertension Blood pressure is 136/82 mmHg, which is acceptable. - Continue current antihypertensive regimen with olmesartan  and maxide  Obesity, class 2 BMI is 40.28, indicating class 2 obesity. She is hesitant about weight loss medications due to cost and administration method. - Encourage dietary modifications, including increased protein intake. - Consider protein shakes to help curb appetite. - Discussed weight loss medications like Wegovy, but will hold off for now  Mixed hyperlipidemia Elevated triglycerides at 320 mg/dL and LDL at 824 mg/dL. Previously did not tolerate atorvastatin  and declined rosuvastatin  due to side effect concerns.  Depression and anxiety disorder Reports doing well Depression screening score is 6, and anxiety screening score is 14, indicating moderate anxiety.   Allergic rhinitis Currently managed with loratadine  10 mg daily. - Continue loratadine  10 mg daily.  Menopausal symptoms (hot flashes) Experiencing hot flashes, especially in warm environments. - Recommend black cohosh as a non-hormonal option for managing hot flashes.   Microcytic anemia, unspecified MCV and MCH have been low  for many years. - Order iron panel to investigate microcytic anemia.  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 Suspected dermatitis with difficulty in obtaining a dermatology appointment. - Place referral to dermatology for further evaluation of eyelid dermatitis.        Follow up plan: Return in about 6 months (around 01/16/2025) for follow  up.

## 2024-07-19 NOTE — Telephone Encounter (Signed)
 I guess she is referring to a iron supplement? Does she even need?

## 2024-07-19 NOTE — Patient Instructions (Signed)
Estroven black cohosh

## 2024-07-19 NOTE — Telephone Encounter (Signed)
 Copied from CRM [id number]. Topic: General - Other >> Jul 19, 2024 11:29 AM Tinnie BROCKS wrote: Reason for CRM: Pt calling in regarding labs. Per lab, they are not sure her insurance would pay for it because it is missing codes. Pt says she completed the labs already today, but they need to be coded differently for insurance coverage.

## 2024-07-20 LAB — CBC WITH DIFFERENTIAL/PLATELET
Absolute Lymphocytes: 2696 {cells}/uL (ref 850–3900)
Absolute Monocytes: 553 {cells}/uL (ref 200–950)
Basophils Absolute: 63 {cells}/uL (ref 0–200)
Basophils Relative: 1.1 %
Eosinophils Absolute: 91 {cells}/uL (ref 15–500)
Eosinophils Relative: 1.6 %
HCT: 45.6 % — ABNORMAL HIGH (ref 35.0–45.0)
Hemoglobin: 13.6 g/dL (ref 11.7–15.5)
MCH: 21 pg — ABNORMAL LOW (ref 27.0–33.0)
MCHC: 29.8 g/dL — ABNORMAL LOW (ref 32.0–36.0)
MCV: 70.3 fL — ABNORMAL LOW (ref 80.0–100.0)
MPV: 10.5 fL (ref 7.5–12.5)
Monocytes Relative: 9.7 %
Neutro Abs: 2297 {cells}/uL (ref 1500–7800)
Neutrophils Relative %: 40.3 %
Platelets: 327 Thousand/uL (ref 140–400)
RBC: 6.49 Million/uL — ABNORMAL HIGH (ref 3.80–5.10)
RDW: 17 % — ABNORMAL HIGH (ref 11.0–15.0)
Total Lymphocyte: 47.3 %
WBC: 5.7 Thousand/uL (ref 3.8–10.8)

## 2024-07-20 LAB — COMPREHENSIVE METABOLIC PANEL WITH GFR
AG Ratio: 1.8 (calc) (ref 1.0–2.5)
ALT: 21 U/L (ref 6–29)
AST: 16 U/L (ref 10–35)
Albumin: 4.7 g/dL (ref 3.6–5.1)
Alkaline phosphatase (APISO): 55 U/L (ref 37–153)
BUN: 16 mg/dL (ref 7–25)
CO2: 30 mmol/L (ref 20–32)
Calcium: 9.8 mg/dL (ref 8.6–10.4)
Chloride: 103 mmol/L (ref 98–110)
Creat: 0.89 mg/dL (ref 0.50–1.05)
Globulin: 2.6 g/dL (ref 1.9–3.7)
Glucose, Bld: 84 mg/dL (ref 65–99)
Potassium: 5.1 mmol/L (ref 3.5–5.3)
Sodium: 141 mmol/L (ref 135–146)
Total Bilirubin: 0.3 mg/dL (ref 0.2–1.2)
Total Protein: 7.3 g/dL (ref 6.1–8.1)
eGFR: 71 mL/min/1.73m2 (ref >=60)

## 2024-07-20 LAB — LIPID PANEL
Cholesterol: 274 mg/dL — ABNORMAL HIGH (ref <200)
HDL: 36 mg/dL — ABNORMAL LOW (ref >=50)
Non-HDL Cholesterol (Calc): 238 mg/dL — ABNORMAL HIGH (ref <130)
Total CHOL/HDL Ratio: 7.6 (calc) — ABNORMAL HIGH (ref <5.0)
Triglycerides: 404 mg/dL — ABNORMAL HIGH (ref <150)

## 2024-07-20 LAB — HEMOGLOBIN A1C
Hgb A1c MFr Bld: 6.2 % — ABNORMAL HIGH (ref <5.7)
Mean Plasma Glucose: 131 mg/dL
eAG (mmol/L): 7.3 mmol/L

## 2024-07-22 ENCOUNTER — Other Ambulatory Visit: Payer: Self-pay | Admitting: Nurse Practitioner

## 2024-07-22 ENCOUNTER — Ambulatory Visit: Payer: Self-pay | Admitting: Nurse Practitioner

## 2024-07-22 DIAGNOSIS — E611 Iron deficiency: Secondary | ICD-10-CM

## 2024-07-23 ENCOUNTER — Telehealth: Payer: Self-pay

## 2024-07-23 ENCOUNTER — Other Ambulatory Visit: Payer: Self-pay

## 2024-07-23 DIAGNOSIS — Z1211 Encounter for screening for malignant neoplasm of colon: Secondary | ICD-10-CM

## 2024-07-23 DIAGNOSIS — L819 Disorder of pigmentation, unspecified: Secondary | ICD-10-CM

## 2024-07-23 NOTE — Telephone Encounter (Signed)
 Copied from CRM [id number]. Topic: Referral - Question >> Jul 23, 2024  2:41 PM Sophia H wrote: Reason for CRM: Patient states she received a call from the dermatologist she was referred to and cannot get in till next year. Needs to be referred somewhere else with sooner availability. Please advise # [phone]

## 2024-07-23 NOTE — Telephone Encounter (Signed)
 Pt called cologuard updated, but wants to see if you will treat her for the discoloration on her eye, since it will take so long to get into derm.?

## 2024-07-23 NOTE — Telephone Encounter (Signed)
 Copied from CRM [id number]. Topic: Referral - Question >> Jul 23, 2024  2:41 PM Sophia H wrote: Reason for CRM: Patient states she received a call from the dermatologist she was referred to and cannot get in till next year. Needs to be referred somewhere else with sooner availability. Please advise # [phone] >> Jul 23, 2024  4:04 PM Fonda T wrote: Patient is calling back, requesting to speak directly to provider regarding concerns with dermatology referral, and colonoscopy. Patient declines to speak to anyone else other than provider.   Called office, states provider is not in office at time of call, advised to send CRM to clinical.  Advised patient a message would be sent to office regarding her request, patient declined to give any further details or information.  Patient can be reached at home, [phone], or on her mobile phone, [phone]

## 2024-07-24 ENCOUNTER — Other Ambulatory Visit: Payer: Self-pay | Admitting: Nurse Practitioner

## 2024-08-16 LAB — COLOGUARD: COLOGUARD: NEGATIVE

## 2024-08-19 ENCOUNTER — Ambulatory Visit: Payer: Self-pay | Admitting: Nurse Practitioner

## 2024-09-03 ENCOUNTER — Ambulatory Visit: Payer: Self-pay

## 2024-09-03 NOTE — Telephone Encounter (Signed)
  FYI Only or Action Required?: Action required by provider: clinical question for provider. Please see note below.  Patient was last seen in primary care on 07/19/2024 by Gareth Mliss FALCON, FNP.  Called Nurse Triage reporting slobbering at night while sleeping on right side. .  Symptoms began a week ago.  Interventions attempted: Nothing.  Symptoms are: unchanged.  Triage Disposition: See PCP Within 2 Weeks  Patient/caregiver understands and will follow disposition?: No, wishes to speak with PCP                     Reason for Disposition  All other mouth symptoms  (Exceptions: Dry mouth from not drinking enough liquids, chapped lips.)  Answer Assessment - Initial Assessment Questions Pt called to report that she is slobbering at night when she sleeps on her right side. Pt is only able to sleep on her right side at this time. Pt was transferred to NT as pt was asking about stroke s/s and mentioned HA, and dizziness. Pt states that HA was from doing something with her hair, and dizziness has resolved. Pt has even smile  - no one sided weakness, or loss of memory.  Pt's only true complaint is that she is slobbering while sleeping and she finds this to be upsetting as she is not a soberer. Pt is wondering why this is happening and wants it to stop. Pt does not want to come into office as she was just here and financially this is not feasible.   1. SYMPTOM: What's the main symptom you're concerned about? (e.g., chapped lips, dry mouth, lump, sores)     Pt states that she is slobbering at night when sleeping on her right side 2. ONSET: When did the  slobbering  start?     Less than 1 month 3. PAIN: Is there any pain? If Yes, ask: How bad is it? (Scale: 0-10; or none, mild, moderate, severe)     no 4. CAUSE: What do you think is causing the symptoms?     unknown 5. OTHER SYMPTOMS: Do you have any other symptoms? (e.g., fever, sore throat, toothache,  swelling)     anxiety  Protocols used: Mouth Symptoms-A-AH

## 2024-09-03 NOTE — Telephone Encounter (Signed)
 Copied from CRM [id number]. Topic: Clinical - Red Word Triage >> Sep 03, 2024  4:52 PM Tobias CROME wrote: Red Word that prompted transfer to Nurse Triage: Patient states she has been slobbering lately when sleeping. Patient did not use to have this problem before. lightheaded and dizzy sometimes

## 2024-09-04 NOTE — Telephone Encounter (Signed)
 FYI: if she does not want to come in there is nothing we can do?

## 2024-09-04 NOTE — Telephone Encounter (Signed)
 Pt.notified

## 2024-10-07 ENCOUNTER — Ambulatory Visit: Payer: Self-pay

## 2024-10-07 NOTE — Telephone Encounter (Signed)
 FYI Only or Action Required?: FYI only for provider: appointment scheduled on 12.3.25.  Patient was last seen in primary care on 07/19/2024 by Gareth Mliss FALCON, FNP.  Called Nurse Triage reporting Back Pain.  Symptoms began a week ago.  Interventions attempted: OTC medications: tylenol .  Symptoms are: gradually improving.  Triage Disposition: See PCP When Office is Open (Within 3 Days)  Patient/caregiver understands and will follow disposition?: Yes      Copied from CRM #[id number]. Topic: Clinical - Red Word Triage >> Oct 07, 2024  8:40 AM Delon HERO wrote: Red Word that prompted transfer to Nurse Triage: Patient is calling to report pain going down her left scaticia left leg that started Tuesday. Reporting that she has had the pain several times. And requesting pain medication. Reason for Disposition  [1] MODERATE back pain (e.g., interferes with normal activities) AND [2] present > 3 days  Answer Assessment - Initial Assessment Questions Back pain started x1 week. Initially pain was unbearable and radiated into groin and down left leg. Tylenol  helps slightly but states she needs pain meds that were given to her previously. She states she was using weight for arm exercises and thinks maybe that is what triggered it. Denies any higher acuity questions, denies any urinary symptoms.      1. ONSET: When did the pain begin? (e.g., minutes, hours, days)     About a week ago 2. LOCATION: Where does it hurt? (upper, mid or lower back)     Left back into left leg 3. SEVERITY: How bad is the pain?  (e.g., Scale 1-10; mild, moderate, or severe)     It's up there 4. PATTERN: Is the pain constant? (e.g., yes, no; constant, intermittent)      intermittent 5. RADIATION: Does the pain shoot into your legs or somewhere else?     Left leg 6. CAUSE:  What do you think is causing the back pain?      sciatica 7. BACK OVERUSE:  Any recent lifting of heavy objects, strenuous work or  exercise?     Did use weights for arm exercises 8. MEDICINES: What have you taken so far for the pain? (e.g., nothing, acetaminophen , NSAIDS)     Tylenol  helps but does not completely take it away 9. NEUROLOGIC SYMPTOMS: Do you have any weakness, numbness, or problems with bowel/bladder control?     denies 10. OTHER SYMPTOMS: Do you have any other symptoms? (e.g., fever, abdomen pain, burning with urination, blood in urine)       denies  Protocols used: Back Pain-A-AH

## 2024-10-09 ENCOUNTER — Other Ambulatory Visit: Payer: Self-pay | Admitting: Nurse Practitioner

## 2024-10-09 ENCOUNTER — Encounter: Payer: Self-pay | Admitting: Nurse Practitioner

## 2024-10-09 ENCOUNTER — Ambulatory Visit: Admitting: Nurse Practitioner

## 2024-10-09 ENCOUNTER — Other Ambulatory Visit (HOSPITAL_COMMUNITY): Payer: Self-pay

## 2024-10-09 ENCOUNTER — Telehealth: Payer: Self-pay | Admitting: Pharmacy Technician

## 2024-10-09 VITALS — BP 138/84 | HR 80 | Temp 97.0°F | Ht 62.0 in | Wt 220.0 lb

## 2024-10-09 DIAGNOSIS — M5442 Lumbago with sciatica, left side: Secondary | ICD-10-CM | POA: Diagnosis not present

## 2024-10-09 DIAGNOSIS — Z23 Encounter for immunization: Secondary | ICD-10-CM

## 2024-10-09 MED ORDER — KETOROLAC TROMETHAMINE 60 MG/2ML IM SOLN
60.0000 mg | Freq: Once | INTRAMUSCULAR | Status: AC
  Administered 2024-10-09: 60 mg via INTRAMUSCULAR

## 2024-10-09 MED ORDER — TRIAMCINOLONE ACETONIDE 40 MG/ML IJ SUSP
40.0000 mg | Freq: Once | INTRAMUSCULAR | Status: AC
  Administered 2024-10-09: 40 mg via INTRAMUSCULAR

## 2024-10-09 MED ORDER — METHOCARBAMOL 500 MG PO TABS: 500.0000 mg | ORAL_TABLET | Freq: Three times a day (TID) | ORAL | 0 refills | Status: DC

## 2024-10-09 MED ORDER — PREDNISONE 10 MG (21) PO TBPK: ORAL_TABLET | ORAL | 0 refills | Status: AC

## 2024-10-09 MED ORDER — BACLOFEN 5 MG PO TABS: 5.0000 mg | ORAL_TABLET | Freq: Three times a day (TID) | ORAL | 1 refills | Status: AC | PRN

## 2024-10-09 NOTE — Progress Notes (Signed)
 BP 138/84   Pulse 80   Temp (!) 97 F (36.1 C)   Ht 5' 2 (1.575 m)   Wt 220 lb (99.8 kg)   SpO2 98%   BMI 40.24 kg/m    Subjective:    Patient ID: Vanessa Gross, female    DOB: August 12, 1956, 68 y.o.   MRN: [id number]  HPI: Vanessa Gross is a 68 y.o. female  Chief Complaint  Patient presents with   Leg Pain    Pt c/o shooting pain in left leg.    Discussed the use of AI scribe software for clinical note transcription with the patient, who gave verbal consent to proceed.  History of Present Illness Vanessa Gross is a 68 year old female who presents with leg pain related to her back.  Low back pain and left Radicular leg pain - Onset approximately two weeks ago, prior to Thanksgiving -denies any trauma,  this is a recurrent chronic problem - Pain originates from the left side and is related to her back - Previous steroid and pain injections were effective in symptom management - Tylenol  has been ineffective for pain control - Muscle relaxants have been effective in the past        07/19/2024    8:24 AM 02/23/2024    1:39 PM 02/15/2024    3:38 PM  Depression screen PHQ 2/9  Decreased Interest 1 2 2   Down, Depressed, Hopeless 1 2 2   PHQ - 2 Score 2 4 4   Altered sleeping 1 2 3   Tired, decreased energy 1 1 0  Change in appetite 1 1 1   Feeling bad or failure about yourself  0 2 2  Trouble concentrating 1 1 1   Moving slowly or fidgety/restless 0 0 0  Suicidal thoughts 0 0 0  PHQ-9 Score 6  11  11    Difficult doing work/chores Somewhat difficult       Data saved with a previous flowsheet row definition    Relevant past medical, surgical, family and social history reviewed and updated as indicated. Interim medical history since our last visit reviewed. Allergies and medications reviewed and updated.  Review of Systems  Ten systems reviewed and is negative except as mentioned in HPI      Objective:      BP 138/84   Pulse 80   Temp (!) 97 F (36.1 C)    Ht 5' 2 (1.575 m)   Wt 220 lb (99.8 kg)   SpO2 98%   BMI 40.24 kg/m    Wt Readings from Last 3 Encounters:  10/09/24 220 lb (99.8 kg)  07/19/24 220 lb 3.2 oz (99.9 kg)  01/17/24 213 lb 3.2 oz (96.7 kg)    Physical Exam GENERAL: Alert, cooperative, well developed, no acute distress HEENT: Normocephalic, normal oropharynx, moist mucous membranes CHEST: Clear to auscultation bilaterally, No wheezes, rhonchi, or crackles CARDIOVASCULAR: Normal heart rate and rhythm, S1 and S2 normal without murmurs ABDOMEN: Soft, non-tender, non-distended, without organomegaly, Normal bowel sounds EXTREMITIES: No cyanosis or edema NEUROLOGICAL: Cranial nerves grossly intact, Moves all extremities without gross motor or sensory deficit  Results for orders placed or performed in visit on 07/23/24  Cologuard   Collection Time: 08/05/24  6:30 AM  Result Value Ref Range   COLOGUARD Negative Negative          Assessment & Plan:   Problem List Items Addressed This Visit       Other   Low back pain - Primary  Relevant Medications   predniSONE  (STERAPRED UNI-PAK 21 TAB) 10 MG (21) TBPK tablet   methocarbamol  (ROBAXIN ) 500 MG tablet   Other Visit Diagnoses       Immunization due       Relevant Orders   Flu vaccine HIGH DOSE PF(Fluzone Trivalent) (Completed)        Assessment and Plan Assessment & Plan Lumbago with left-sided sciatica Chronic lumbago with left-sided sciatica, exacerbated since before Thanksgiving. Previous steroid and pain injections were effective. Current episode involves left-sided pain, possibly related to sinus and allergies. Tylenol  was ineffective for pain management. - Administered steroid injection today and Toradol  injection - Prescribed steroid taper to start tomorrow - Advised to wait to pick up steroid taper if pain is manageable after injection - Continue using muscle relaxers as needed robaxin  500 mg as needed - can do heat therapy and stretching, if no  improvement may need to go back to ortho  Encounter for immunization Received flu shot today. - Administered flu shot        Follow up plan: Return if symptoms worsen or fail to improve.

## 2024-10-09 NOTE — Telephone Encounter (Signed)
 Pharmacy Patient Advocate Encounter   Received notification from CoverMyMeds that prior authorization for Methocarbamol  500MG  tablets is required/requested.   Insurance verification completed.   The patient is insured through [facility] [employer].   Per test claim: Per test claim medication is not covered due to product not on formulary. Patient plan has closed formulary, therefore PA was not sent.    **Tizanidine  and Baclofen  are covered**

## 2024-10-14 ENCOUNTER — Other Ambulatory Visit: Payer: Self-pay | Admitting: Nurse Practitioner

## 2024-10-14 ENCOUNTER — Ambulatory Visit: Payer: Self-pay

## 2024-10-14 DIAGNOSIS — M5442 Lumbago with sciatica, left side: Secondary | ICD-10-CM

## 2024-10-14 MED ORDER — NAPROXEN 500 MG PO TABS: 500.0000 mg | ORAL_TABLET | Freq: Two times a day (BID) | ORAL | 0 refills | Status: AC

## 2024-10-14 NOTE — Telephone Encounter (Signed)
 FYI Only or Action Required?: PT wants medication that works and will take away her sciatic pain and is not too expensive  Patient was last seen in primary care on 10/09/2024 by Gareth Mliss FALCON, FNP.  Called Nurse Triage reporting Back Pain.  Symptoms began several weeks ago.  Interventions attempted: Prescription medications: Steroids.  Symptoms are: unchanged.  Triage Disposition: See PCP When Office is Open (Within 3 Days)  Patient/caregiver understands and will follow disposition?: No, refuses disposition - Pt will not come into office                   Copied from CRM #[id number]. Topic: Clinical - Red Word Triage >> Oct 14, 2024  8:38 AM Vanessa Gross wrote: Red Word that prompted transfer to Nurse Triage: Patient states she was in the office on 12/03 for back pain radiating down her left leg and states she is still having pain and the medication prescribed does not work. Reason for Disposition  [1] Pain radiates into the thigh or further down the leg AND [2] one leg  Answer Assessment - Initial Assessment Questions 1. ONSET: When did the pain begin? (e.g., minutes, hours, days)     A few weeks 2. LOCATION: Where does it hurt? (upper, mid or lower back)     Left leg 3. SEVERITY: How bad is the pain?  (e.g., Scale 1-10; mild, moderate, or severe)     10/10 4. PATTERN: Is the pain constant? (e.g., yes, no; constant, intermittent)      Comes and goes 5. RADIATION: Does the pain shoot into your legs or somewhere else?     Left leg 6. CAUSE:  What do you think is causing the back pain?      Sciatic pain  8. MEDICINES: What have you taken so far for the pain? (e.g., nothing, acetaminophen , NSAIDS)     Yes - prednisone  9. NEUROLOGIC SYMPTOMS: Do you have any weakness, numbness, or problems with bowel/bladder control?     no 10. OTHER SYMPTOMS: Do you have any other symptoms? (e.g., fever, abdomen pain, burning with urination, blood in urine)        no  Protocols used: Back Pain-A-AH

## 2024-10-14 NOTE — Telephone Encounter (Signed)
 Pt.notified

## 2024-10-23 ENCOUNTER — Ambulatory Visit: Payer: Self-pay

## 2024-10-23 NOTE — Telephone Encounter (Signed)
 FYI Only or Action Required?: FYI only for provider: appointment scheduled on 11/04/24 and update on pt condition.  Patient was last seen in primary care on 10/09/2024 by Gareth Mliss FALCON, FNP.  Called Nurse Triage reporting Pain.  Symptoms began several days ago.  Interventions attempted: OTC medications: tylenol  and Ice/heat application.  Symptoms are: unchanged.  Triage Disposition: See PCP When Office is Open (Within 3 Days)  Patient/caregiver understands and will follow disposition?: Yes   Copied from CRM #[id number]. Topic: Clinical - Red Word Triage >> Oct 23, 2024  8:08 AM [place] MATSU wrote: Red Word that prompted transfer to Nurse Triage: patient is having a side effect from the medication that was given on 10/09/24 for sciatica; per patient she has cramps and pain now. Reason for Disposition  [1] MODERATE pain (e.g., interferes with normal activities) AND [2] present > 3 days  Answer Assessment - Initial Assessment Questions Pt reports extreme muscle and body aches since Sunday; pt suspected she had done too much baking as pain started in her hands. States Sunday night she woke up to extreme pain in her thighs and legs; excruciating cramps. Pt states she thought it was dehydration, so she drank vinegar and eat pickles to aid with salt intake. States it helped some but pain persisted. Today, pain is body throughout, pt has d/c baclofen and naproxen, is only taking tylenol and applying heat. Pt states pain is still present but below a 10. Pt denies any fever, chest pain, SOB. Screened for side effects of medications: pt denies confusion, hallucinations. Pt offered appt tomorrow with PCP but stated she was too busy this week or next. Discussed I would send info to PCP. Appointment scheduled for evaluation 12/29. Patient agrees with plan of care, and will call back if anything changes, or if symptoms worsen.     1. ONSET: When did the muscle aches or body pains start?      Sunday    2. LOCATION: What part of your body is hurting? (e.g., entire body, arms, legs)      Started in her hands progressed to legs and thighs, now body throughout   3. SEVERITY: How bad is the pain? (Scale 1-10; or mild, moderate, severe)     12 /10 when flared; pt reports feeling okay now, 9/10  4. CAUSE: What do you think is causing the pains?     Pt suspects Baclofen  5 MG TABS  5. FEVER: Do you have a fever? If Yes, ask: What is your temperature, how was it measured, and  when did it start?      No fever   6. OTHER SYMPTOMS: Do you have any other symptoms? (e.g., chest pain, cold or flu symptoms, rash, weakness, weight loss)    No other symptoms; pt suspected dehydration d/t cramping  Protocols used: Muscle Aches and Body Pain-A-AH

## 2024-11-04 ENCOUNTER — Ambulatory Visit: Payer: PRIVATE HEALTH INSURANCE | Admitting: Nurse Practitioner

## 2024-11-06 ENCOUNTER — Ambulatory Visit: Admitting: Nurse Practitioner

## 2024-11-06 ENCOUNTER — Encounter: Payer: Self-pay | Admitting: Nurse Practitioner

## 2024-11-06 VITALS — BP 138/86 | HR 74 | Temp 98.0°F | Ht 62.0 in | Wt 216.0 lb

## 2024-11-06 DIAGNOSIS — R52 Pain, unspecified: Secondary | ICD-10-CM | POA: Diagnosis not present

## 2024-11-06 DIAGNOSIS — R252 Cramp and spasm: Secondary | ICD-10-CM | POA: Diagnosis not present

## 2024-11-06 MED ORDER — PREGABALIN 25 MG PO CAPS: 25.0000 mg | ORAL_CAPSULE | Freq: Two times a day (BID) | ORAL | 0 refills | Status: AC

## 2024-11-06 NOTE — Progress Notes (Signed)
 "  BP 138/86   Pulse 74   Temp 98 F (36.7 C)   Ht 5' 2 (1.575 m)   Wt 216 lb (98 kg)   SpO2 99%   BMI 39.51 kg/m    Subjective:    Patient ID: Vanessa Gross, female    DOB: 1956-10-24, 68 y.o.   MRN: [id number]  HPI: Vanessa Gross is a 68 y.o. female  Chief Complaint  Patient presents with   Pain Management    Pt c/o chronic pain.    Discussed the use of AI scribe software for clinical note transcription with the patient, who gave verbal consent to proceed.  History of Present Illness Vanessa Gross is a 68 year old female who presents with persistent lower back pain and new onset muscle cramps.  Lower back pain - Persistent lower back pain ongoing for several visits - Last evaluated on October 09, 2024 - Previously treated with prednisone  taper and Robaxin ; Robaxin  switched to baclofen  due to insurance issues - Currently not taking baclofen  or naproxen  - Using Tylenol  for pain management  Generalized muscle cramps and myalgias - New onset muscle pain and body aches since Sunday - Cramping pain in hands, thighs, and legs - Muscle cramps described as occurring 'everywhere' - Symptoms have impacted ability to perform tasks, such as bending down to pick up a pen - Attempted self-management with increased hydration, vinegar, and pickles without relief  Constitutional symptoms - No fever         07/19/2024    8:24 AM 02/23/2024    1:39 PM 02/15/2024    3:38 PM  Depression screen PHQ 2/9  Decreased Interest 1 2 2   Down, Depressed, Hopeless 1 2 2   PHQ - 2 Score 2 4 4   Altered sleeping 1 2 3   Tired, decreased energy 1 1 0  Change in appetite 1 1 1   Feeling bad or failure about yourself  0 2 2  Trouble concentrating 1 1 1   Moving slowly or fidgety/restless 0 0 0  Suicidal thoughts 0 0 0  PHQ-9 Score 6  11  11    Difficult doing work/chores Somewhat difficult       Data saved with a previous flowsheet row definition    Relevant past medical, surgical,  family and social history reviewed and updated as indicated. Interim medical history since our last visit reviewed. Allergies and medications reviewed and updated.  Review of Systems  Ten systems reviewed and is negative except as mentioned in HPI      Objective:      BP 138/86   Pulse 74   Temp 98 F (36.7 C)   Ht 5' 2 (1.575 m)   Wt 216 lb (98 kg)   SpO2 99%   BMI 39.51 kg/m    Wt Readings from Last 3 Encounters:  11/06/24 216 lb (98 kg)  10/09/24 220 lb (99.8 kg)  07/19/24 220 lb 3.2 oz (99.9 kg)    Physical Exam GENERAL: Alert, cooperative, well developed, no acute distress HEENT: Normocephalic, normal oropharynx, moist mucous membranes CHEST: Clear to auscultation bilaterally, No wheezes, rhonchi, or crackles CARDIOVASCULAR: Normal heart rate and rhythm, S1 and S2 normal without murmurs ABDOMEN: Soft, non-tender, non-distended, without organomegaly, Normal bowel sounds EXTREMITIES: No cyanosis or edema NEUROLOGICAL: Cranial nerves grossly intact, Moves all extremities without gross motor or sensory deficit  Results for orders placed or performed in visit on 07/23/24  Cologuard   Collection Time: 08/05/24  6:30 AM  Result Value Ref Range   COLOGUARD Negative Negative          Assessment & Plan:   Problem List Items Addressed This Visit   None Visit Diagnoses       Generalized body aches    -  Primary   Relevant Medications   pregabalin (LYRICA) 25 MG capsule   Other Relevant Orders   CBC with Differential/Platelet   Comprehensive metabolic panel with GFR   Sedimentation rate   C-reactive protein   Vitamin B12   TSH   Iron, TIBC and Ferritin Panel   Analyzer ANA IFA w/RFLX Titer/Pattern,Systemic Autoimmune Panel 1   CK     Cramps, muscle, general       Relevant Medications   pregabalin (LYRICA) 25 MG capsule   Other Relevant Orders   CBC with Differential/Platelet   Comprehensive metabolic panel with GFR   Sedimentation rate   C-reactive  protein   Vitamin B12   TSH   Iron, TIBC and Ferritin Panel   Analyzer ANA IFA w/RFLX Titer/Pattern,Systemic Autoimmune Panel 1   CK        Assessment and Plan Assessment & Plan Generalized muscle pain and cramps Reports muscle pain and cramps since Sunday, affecting hands, thighs, and legs. Denies fever. Differential diagnosis includes dehydration and autoimmune disorder. Previous medications (baclofen  and naproxen ) discontinued. Gabapentin considered but declined due to potential side effects. Lyrica (pregabalin) chosen as an alternative treatment option. - Ordered lab work to evaluate for dehydration and autoimmune disorder - Prescribed Lyrica (pregabalin) for nerve pain and muscle cramps  Chronic low back pain Persists despite previous treatment with prednisone  taper and baclofen . Pain improved but still present. Declines referral to orthopedics. - Continue current pain management with Tylenol  - Prescribed Lyrica (pregabalin) to address nerve pain associated with back pain        Follow up plan: Return if symptoms worsen or fail to improve. "

## 2024-11-08 ENCOUNTER — Ambulatory Visit: Payer: Self-pay | Admitting: Nurse Practitioner

## 2024-11-08 ENCOUNTER — Other Ambulatory Visit: Payer: Self-pay | Admitting: Nurse Practitioner

## 2024-11-08 DIAGNOSIS — R52 Pain, unspecified: Secondary | ICD-10-CM

## 2024-11-08 DIAGNOSIS — R252 Cramp and spasm: Secondary | ICD-10-CM

## 2024-11-10 LAB — ANALYZER(R)ANA IFA WITH REFLEX TITER/PATTRN,SYS AUTOIMM PNL1
Anti Nuclear Antibody (ANA): NEGATIVE
Anticardiolipin IgA: <2 [APL'U]/mL
Anticardiolipin IgG: <2 [GPL'U]/mL
Anticardiolipin IgM: 4.2 [MPL'U]/mL
Beta-2 Glyco 1 IgA: <2 U/mL
Beta-2 Glyco 1 IgM: 3 U/mL
Beta-2 Glyco I IgG: <2 U/mL
C3 Complement: 184 mg/dL (ref 83–193)
C4 Complement: 43 mg/dL (ref 15–57)
Centromere Ab Screen: <1 AI
Chromatin (Nucleosomal) Antibody: <1 AI
Cyclic Citrullin Peptide Ab: <16 U
DNA Ab (DS) Crithidia, IFA: NEGATIVE
ENA SM Ab Ser-aCnc: <1 AI
Jo-1 Autoabs: <1 AI
MUTATED CITRULLINATED VIMENTIN (MCV) AB: <20 U/mL
Rheumatoid Factor (IgA): <5 U
Rheumatoid Factor (IgG): <5 U
Rheumatoid Factor (IgM): <5 U
Ribonucleic Protein(ENA) Antibody, IgG: <1 AI
SM/RNP: <1 AI
SSA (Ro) (ENA) Antibody, IgG: <1 AI
SSB (La) (ENA) Antibody, IgG: <1 AI
Scleroderma (Scl-70) (ENA) Antibody, IgG: <1 AI
Thyroperoxidase Ab SerPl-aCnc: <1 [IU]/mL

## 2024-11-10 LAB — COMPREHENSIVE METABOLIC PANEL WITH GFR
AG Ratio: 1.8 (calc) (ref 1.0–2.5)
ALT: 19 U/L (ref 6–29)
AST: 15 U/L (ref 10–35)
Albumin: 4.5 g/dL (ref 3.6–5.1)
Alkaline phosphatase (APISO): 63 U/L (ref 37–153)
BUN/Creatinine Ratio: 18 (calc) (ref 6–22)
BUN: 19 mg/dL (ref 7–25)
CO2: 31 mmol/L (ref 20–32)
Calcium: 9.5 mg/dL (ref 8.6–10.4)
Chloride: 103 mmol/L (ref 98–110)
Creat: 1.07 mg/dL — ABNORMAL HIGH (ref 0.50–1.05)
Globulin: 2.5 g/dL (ref 1.9–3.7)
Glucose, Bld: 81 mg/dL (ref 65–99)
Potassium: 4.5 mmol/L (ref 3.5–5.3)
Sodium: 141 mmol/L (ref 135–146)
Total Bilirubin: 0.4 mg/dL (ref 0.2–1.2)
Total Protein: 7 g/dL (ref 6.1–8.1)
eGFR: 57 mL/min/1.73m2 — ABNORMAL LOW

## 2024-11-10 LAB — IRON,TIBC AND FERRITIN PANEL
%SAT: 18 % (ref 16–45)
Ferritin: 89 ng/mL (ref 16–288)
Iron: 59 ug/dL (ref 45–160)
TIBC: 336 ug/dL (ref 250–450)

## 2024-11-10 LAB — CBC WITH DIFFERENTIAL/PLATELET
Absolute Lymphocytes: 2146 {cells}/uL (ref 850–3900)
Absolute Monocytes: 499 {cells}/uL (ref 200–950)
Basophils Absolute: 58 {cells}/uL (ref 0–200)
Basophils Relative: 1 %
Eosinophils Absolute: 70 {cells}/uL (ref 15–500)
Eosinophils Relative: 1.2 %
HCT: 43.4 % (ref 35.9–46.0)
Hemoglobin: 13.1 g/dL (ref 11.7–15.5)
MCH: 20.6 pg — ABNORMAL LOW (ref 27.0–33.0)
MCHC: 30.2 g/dL — ABNORMAL LOW (ref 31.6–35.4)
MCV: 68.2 fL — ABNORMAL LOW (ref 81.4–101.7)
MPV: 10.7 fL (ref 7.5–12.5)
Monocytes Relative: 8.6 %
Neutro Abs: 3028 {cells}/uL (ref 1500–7800)
Neutrophils Relative %: 52.2 %
Platelets: 341 Thousand/uL (ref 140–400)
RBC: 6.36 Million/uL — ABNORMAL HIGH (ref 3.80–5.10)
RDW: 17 % — ABNORMAL HIGH (ref 11.0–15.0)
Total Lymphocyte: 37 %
WBC: 5.8 Thousand/uL (ref 3.8–10.8)

## 2024-11-10 LAB — TEST AUTHORIZATION

## 2024-11-10 LAB — VITAMIN B12: Vitamin B-12: 607 pg/mL (ref 200–1100)

## 2024-11-10 LAB — MAGNESIUM: Magnesium: 2.3 mg/dL (ref 1.5–2.5)

## 2024-11-10 LAB — TEST AUTHORIZATION 2

## 2024-11-10 LAB — C-REACTIVE PROTEIN: CRP: 4.6 mg/L

## 2024-11-10 LAB — CK: Total CK: 133 U/L (ref 20–243)

## 2024-11-10 LAB — SEDIMENTATION RATE: Sed Rate: 2 mm/h (ref 0–30)

## 2024-11-10 LAB — TSH: TSH: 1.06 m[IU]/L (ref 0.40–4.50)

## 2024-11-21 ENCOUNTER — Ambulatory Visit

## 2024-11-21 DIAGNOSIS — Z0000 Encounter for general adult medical examination without abnormal findings: Secondary | ICD-10-CM

## 2024-11-21 DIAGNOSIS — Z599 Problem related to housing and economic circumstances, unspecified: Secondary | ICD-10-CM

## 2024-11-21 NOTE — Patient Instructions (Addendum)
 Vanessa Gross,  Thank you for taking the time for your Medicare Wellness Visit. I appreciate your continued commitment to your health goals. Please review the care plan we discussed, and feel free to reach out if I can assist you further.  Please note that Annual Wellness Visits do not include a physical exam. Some assessments may be limited, especially if the visit was conducted virtually. If needed, we may recommend an in-person follow-up with your provider.  Ongoing Care Seeing your primary care provider every 3 to 6 months helps us  monitor your health and provide consistent, personalized care. 01/20/25 @ 11:00 AM APPT. W/ JULIE PENDER  Referrals If a referral was made during today's visit and you haven't received any updates within two weeks, please contact the referred provider directly to check on the status.  REFERRAL WAS MADE FOR FINANCIAL DIFFICULTIES  Recommended Screenings:  Health Maintenance  Topic Date Due   Osteoporosis screening with Bone Density Scan  Never done   Zoster (Shingles) Vaccine (1 of 2) Never done   Breast Cancer Screening  02/10/2024   COVID-19 Vaccine (5 - 2025-26 season) 07/08/2024   Medicare Annual Wellness Visit  11/08/2024   Pneumococcal Vaccine for age over 39 (1 of 1 - PCV) 07/19/2025*   Cologuard (Stool DNA test)  08/06/2027   DTaP/Tdap/Td vaccine (2 - Td or Tdap) 05/12/2032   Flu Shot  Completed   Hepatitis C Screening  Completed   Meningitis B Vaccine  Aged Out   Colon Cancer Screening  Discontinued  *Topic was postponed. The date shown is not the original due date.     Vision: Annual vision screenings are recommended for early detection of glaucoma, cataracts, and diabetic retinopathy. These exams can also reveal signs of chronic conditions such as diabetes and high blood pressure.  Dental: Annual dental screenings help detect early signs of oral cancer, gum disease, and other conditions linked to overall health, including heart disease and  diabetes.  Please see the attached documents for additional preventive care recommendations.   NEXT AWV 11/27/25 @ 10:10 AM IN PERSON

## 2024-11-21 NOTE — Progress Notes (Signed)
 "  Chief Complaint  Patient presents with   Medicare Wellness     Subjective:   Vanessa Gross is a 69 y.o. female who presents for a Medicare Annual Wellness Visit.  Visit info / Clinical Intake: Medicare Wellness Visit Type:: Subsequent Annual Wellness Visit Persons participating in visit and providing information:: patient Medicare Wellness Visit Mode:: Telephone If telephone:: video declined Since this visit was completed virtually, some vitals may be partially provided or unavailable. Missing vitals are due to the limitations of the virtual format.: Unable to obtain vitals - no equipment If Telephone or Video please confirm:: I connected with patient using audio/video enable telemedicine. I verified patient identity with two identifiers, discussed telehealth limitations, and patient agreed to proceed. Patient Location:: HOME Provider Location:: OFFICE Interpreter Needed?: No Pre-visit prep was completed: yes AWV questionnaire completed by patient prior to visit?: no Living arrangements:: lives with spouse/significant other Patient's Overall Health Status Rating: excellent Typical amount of pain: some Does pain affect daily life?: no Are you currently prescribed opioids?: no  Dietary Habits and Nutritional Risks How many meals a day?: 2 Eats fruit and vegetables daily?: yes Most meals are obtained by: preparing own meals In the last 2 weeks, have you had any of the following?: none Diabetic:: no  Functional Status Activities of Daily Living (to include ambulation/medication): Independent Ambulation: Independent Medication Administration: Independent Home Management (perform basic housework or laundry): Independent Manage your own finances?: yes Primary transportation is: driving Concerns about vision?: no *vision screening is required for WTM* ([facility]- [facility]) Concerns about hearing?: no  Fall Screening Falls in the past year?: 0 Number of falls in past  year: 0 Was there an injury with Fall?: 0 Fall Risk Category Calculator: 0 Patient Fall Risk Level: Low Fall Risk  Fall Risk Patient at Risk for Falls Due to: No Fall Risks Fall risk Follow up: Falls evaluation completed; Falls prevention discussed  Home and Transportation Safety: All rugs have non-skid backing?: yes All stairs or steps have railings?: N/A, no stairs Grab bars in the bathtub or shower?: yes Have non-skid surface in bathtub or shower?: yes Good home lighting?: yes Regular seat belt use?: yes Hospital stays in the last year:: no  Cognitive Assessment Difficulty concentrating, remembering, or making decisions? : no Will 6CIT or Mini Cog be Completed: yes What year is it?: 4 points What month is it?: 0 points Give patient an address phrase to remember (5 components): [address] [place], [place] About what time is it?: 0 points Count backwards from 20 to 1: 0 points Say the months of the year in reverse: 0 points Repeat the address phrase from earlier: 0 points 6 CIT Score: 4 points  Advance Directives (For Healthcare) Does Patient Have a Medical Advance Directive?: No Does patient want to make changes to medical advance directive?: Yes (MAU/Ambulatory/Procedural Areas - Information given) Type of Advance Directive: Healthcare Power of [place]; Living will Would patient like information on creating a medical advance directive?: No - Patient declined  Reviewed/Updated  Reviewed/Updated: Reviewed All (Medical, Surgical, Family, Medications, Allergies, Care Teams, Patient Goals)    Allergies (verified) Celecoxib and Gabapentin   Current Medications (verified) Outpatient Encounter Medications as of 11/21/2024  Medication Sig   aspirin  EC 81 MG tablet Take 1 tablet (81 mg total) by mouth daily.   loratadine  (EQ ALL DAY ALLERGY RELIEF) 10 MG tablet Take 1 tablet (10 mg total) by mouth daily.   Multiple Vitamin (MULTI-VITAMINS) TABS Take by  mouth.    olmesartan  (BENICAR ) 20 MG tablet Take 1 tablet (20 mg total) by mouth daily.   triamterene -hydrochlorothiazide  (MAXZIDE-25) 37.5-25 MG tablet Take 1 tablet by mouth daily.   Baclofen  5 MG TABS Take 1 tablet (5 mg total) by mouth 3 (three) times daily as needed (msk pain or spasms). (Patient not taking: Reported on 11/21/2024)   naproxen  (NAPROSYN ) 500 MG tablet Take 1 tablet (500 mg total) by mouth 2 (two) times daily with a meal. (Patient not taking: Reported on 11/21/2024)   predniSONE  (STERAPRED UNI-PAK 21 TAB) 10 MG (21) TBPK tablet Use as directed. (Patient not taking: Reported on 11/21/2024)   pregabalin  (LYRICA ) 25 MG capsule Take 1 capsule (25 mg total) by mouth 2 (two) times daily. (Patient not taking: Reported on 11/21/2024)   No facility-administered encounter medications on file as of 11/21/2024.    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    Past Surgical History:  Procedure Laterality Date   ABDOMINAL HYSTERECTOMY     BREAST EXCISIONAL BIOPSY Left    benign   TONSILLECTOMY AND ADENOIDECTOMY     Family History  Problem Relation Age of Onset   Hypertension Mother    Cancer Mother        lpymphoma   Stroke Maternal Grandmother    Stroke Maternal Grandfather    Breast cancer Cousin        maternal   Social History   Occupational History   Not on file  Tobacco Use   Smoking status: Never   Smokeless tobacco: Never  Vaping Use   Vaping status: Never Used  Substance and Sexual Activity   Alcohol use: No     Alcohol/week: 0.0 standard drinks of alcohol   Drug use: No   Sexual activity: Not Currently   Tobacco Counseling Counseling given: Not Answered  SDOH Screenings   Food Insecurity: Food Insecurity Present (11/21/2024)  Housing: Unknown (11/21/2024)  Transportation Needs: No Transportation Needs (11/21/2024)  Utilities: At Risk (11/21/2024)  Alcohol Screen: Low Risk (11/09/2023)  Depression (PHQ2-9): Low Risk (11/21/2024)  Financial Resource Strain: Medium Risk (11/09/2023)  Physical Activity: Insufficiently Active (11/21/2024)  Social Connections: Socially Integrated (11/21/2024)  Stress: No Stress Concern Present (11/21/2024)  Tobacco Use: Low Risk (11/21/2024)  Health Literacy: Adequate Health Literacy (11/21/2024)   See flowsheets for full screening details  Depression Screen Depression Screening Exception Documentation Depression Screening Exception:: Patient refusal  PHQ 2 & 9 Depression Scale- Over the past 2 weeks, how often have you been bothered by any of the following problems? Little interest or pleasure in doing things: 0 Feeling down, depressed, or hopeless (PHQ Adolescent also includes...irritable): 0 PHQ-2 Total Score: 0 Trouble falling or staying asleep, or sleeping too much: 0 Feeling tired or having little energy: 0 Poor appetite or overeating (PHQ Adolescent also includes...weight loss): 0 Feeling bad about yourself - or that you are a failure or have let yourself or your family down: 0 Trouble concentrating on things, such as reading  the newspaper or watching television [facility] Adolescent also includes...like school work): 0 Moving or speaking so slowly that other people could have noticed. Or the opposite - being so fidgety or restless that you have been moving around a lot more than usual: 0 Thoughts that you would be better off dead, or of hurting yourself in some way: 0 PHQ-9 Total Score: 0 If you checked off any problems, how difficult have these problems made it for  you to do your work, take care of things at home, or get along with other people?: Not difficult at all  Depression Treatment Depression Interventions/Treatment : EYV7-0 Score <4 Follow-up Not Indicated     Goals Addressed             This Visit's Progress    DIET - INCREASE WATER INTAKE               Objective:    There were no vitals filed for this visit. There is no height or weight on file to calculate BMI.  Hearing/Vision screen Hearing Screening - Comments:: NO AIDS Vision Screening - Comments:: [facility]- [place] [facility] Immunizations and Health Maintenance Health Maintenance  Topic Date Due   Bone Density Scan  Never done   Zoster Vaccines- Shingrix (1 of 2) Never done   Mammogram  02/10/2024   COVID-19 Vaccine (5 - 2025-26 season) 07/08/2024   Pneumococcal Vaccine: 50+ Years (1 of 1 - PCV) 07/19/2025 (Originally 02/03/2007)   Medicare Annual Wellness (AWV)  11/21/2025   Fecal DNA (Cologuard)  08/06/2027   DTaP/Tdap/Td (2 - Td or Tdap) 05/12/2032   Influenza Vaccine  Completed   Hepatitis C Screening  Completed   Meningococcal B Vaccine  Aged Out   Colonoscopy  Discontinued        Assessment/Plan:  This is a routine wellness examination for Vanessa Gross.  Patient Care Team: Gareth Mliss FALCON, FNP as PCP - General (Nurse Practitioner) Cindie Jesusa HERO, RN as Registered Nurse Dannielle Arlean FALCON, RN (Inactive) as Registered Nurse Mevelyn JONETTA Bathe, OD ([facility], [facility])  I have personally reviewed and noted the following in the patients chart:   Medical and social history Use of alcohol, tobacco or illicit drugs  Current medications and supplements including opioid prescriptions. Functional ability and status Nutritional status Physical activity Advanced directives List of other physicians Hospitalizations, surgeries, and ER visits in previous 12 months Vitals Screenings to include cognitive, depression, and falls Referrals and  appointments  Orders Placed This Encounter  Procedures   AMB Referral VBCI Care Management    Referral Priority:   Routine    Referral Type:   Consultation    Referral Reason:   Care Coordination    Number of Visits Requested:   1   In addition, I have reviewed and discussed with patient certain preventive protocols, quality metrics, and best practice recommendations. A written personalized care plan for preventive services as well as general preventive health recommendations were provided to patient.   Vanessa GORMAN Das, LPN   8/84/7973   Return in about 53 weeks (around 11/27/2025).  After Visit Summary: (MyChart) Due to this being a telephonic visit, the after visit summary with patients personalized plan was offered to patient via MyChart   Nurse Notes: REFERRAL FOR FINANCIAL DIFFICULTIES- NEEDS PNA SHOT; UTD ON COLOGUARD  "

## 2024-11-22 ENCOUNTER — Telehealth: Payer: Self-pay

## 2024-11-22 NOTE — Progress Notes (Signed)
 Complex Care Management Note  Care Guide Note 11/22/2024 Name: Vanessa Gross MRN: [id number] DOB: 23-Mar-1956  [name] is a 69 y.o. year old female who sees Gareth Mliss FALCON, FNP for primary care. I reached out to Dlynn M Bardwell by phone today to offer complex care management services.  [name] was given information about Complex Care Management services today including:   The Complex Care Management services include support from the care team which includes your Nurse Care Manager, Clinical Social Worker, or Pharmacist.  The Complex Care Management team is here to help remove barriers to the health concerns and goals most important to you. Complex Care Management services are voluntary, and the patient may decline or stop services at any time by request to their care team member.   Complex Care Management Consent Status: Patient agreed to services and verbal consent obtained.   Follow up plan:  Telephone appointment with complex care management team member scheduled for:  11/27/24 and 12/04/24  Encounter Outcome:  Patient Scheduled  Leotis [facility], [facility] Guide  Direct Dial: [phone]  Fax [phone]

## 2024-11-27 ENCOUNTER — Other Ambulatory Visit: Payer: PRIVATE HEALTH INSURANCE

## 2024-11-27 NOTE — Patient Instructions (Signed)
 Visit Information  Thank you for taking time to visit with me today. Please don't hesitate to contact me if I can be of assistance to you before our next scheduled appointment.  Our next appointment is by telephone on 12/06/24 at 1130 Please call the care guide team at [phone] if you need to cancel or reschedule your appointment.   Following is a copy of your care plan:   Goals Addressed             This Visit's Progress    BSW Goals       Current SDOH Barriers:  Utilities Affordable housing   Interventions: SW provided patient with resources for assistance with utilities and affordable housing via mail. Patient states she has tried food pantries and they are not for her.  Vanessa Gross, BSW, [facility] [place]  [facility] Social Worker, Population Health [phone]           Please call the Suicide and Crisis Lifeline: 988 call the [place]  National Suicide Prevention Lifeline: (657) 508-1038 or TTY: (772) 259-2737 TTY 812-216-9320) to talk to a trained counselor call 1-800-273-TALK (toll free, 24 hour hotline) if you are experiencing a Mental Health or Behavioral Health Crisis or need someone to talk to.  Patient verbalized understanding of Care plan and visit instructions communicated this visit  Vanessa Gross, BSW, [facility]  [facility] Social Worker, Population Health [phone]

## 2024-11-27 NOTE — Patient Outreach (Signed)
 Social Drivers of Health  Community Resource and Care Coordination Visit Note   11/27/2024  Name: GRADIE BUTRICK MRN: [id number] DOB:1956-07-09  Situation: Referral received for [facility] needs assessment and assistance related to Housing  Financial Strain  utilties. I obtained verbal consent from Patient.  Visit completed with Patient on the phone.   Background:   SDOH Interventions Today    Flowsheet Row Most Recent Value  SDOH Interventions   Housing Interventions Community Resources Provided  Utilities Interventions Community Resources Provided     Assessment:   Goals Addressed             This Visit's Progress    BSW Goals       Current SDOH Barriers:  Utilities Affordable housing   Interventions: SW provided patient with resources for assistance with utilities and affordable housing via mail. Patient states she has tried food pantries and they are not for her.  Thersia Hoar, HEDWIG, MHA [place]  [facility] Social Worker, Population Health [phone]           Recommendation:   Utilize resources for assistance with utilities and locating a new home.   Follow Up Plan:   Telephone follow-up 12/06/24 at 1130  Thersia Hoar, [place], [place] [facility]  Value Based [facility] Social Worker, Population Health [phone]

## 2024-12-03 ENCOUNTER — Other Ambulatory Visit: Payer: PRIVATE HEALTH INSURANCE

## 2024-12-04 ENCOUNTER — Telehealth: Payer: PRIVATE HEALTH INSURANCE

## 2024-12-04 ENCOUNTER — Ambulatory Visit
Admission: RE | Admit: 2024-12-04 | Discharge: 2024-12-04 | Disposition: A | Discharge status: Home / Self Care | Source: Ambulatory Visit | Attending: Nurse Practitioner | Admitting: Nurse Practitioner

## 2024-12-04 ENCOUNTER — Inpatient Hospital Stay: Admission: RE | Admit: 2024-12-04 | Discharge: 2024-12-04 | Attending: Nurse Practitioner

## 2024-12-04 DIAGNOSIS — Z780 Asymptomatic menopausal state: Secondary | ICD-10-CM | POA: Diagnosis present

## 2024-12-04 DIAGNOSIS — Z1231 Encounter for screening mammogram for malignant neoplasm of breast: Secondary | ICD-10-CM | POA: Insufficient documentation

## 2024-12-04 DIAGNOSIS — Z13820 Encounter for screening for osteoporosis: Secondary | ICD-10-CM | POA: Insufficient documentation

## 2024-12-06 ENCOUNTER — Other Ambulatory Visit: Payer: PRIVATE HEALTH INSURANCE

## 2024-12-06 NOTE — Patient Outreach (Signed)
 Social Drivers of Health  Community Resource and Care Coordination Visit Note   12/06/2024  Name: Vanessa Gross MRN: [id number] DOB:1955/12/02  Situation: Referral received for [facility] needs assessment and assistance related to affordable housing and utlilties. I obtained verbal consent from Patient.  Visit completed with Patient on the phone.   Background:      Assessment:   Goals Addressed             This Visit's Progress    BSW Goals       Current SDOH Barriers:  Utilities Affordable housing   Interventions: SW provided patient with resources for assistance with utilities and affordable housing via mail. Patient states she has tried food pantries and they are not for her. SW resent resources via email on file.   Thersia Hoar, HEDWIG, MHA [place]  [facility] Social Worker, Population Health [phone]           Recommendation:   follow up with resources regarding housing needs  Follow Up Plan:   Telephone follow-up 12/18/24 a 11am Thersia Hoar, HEDWIG, [facility]  [facility] Social Worker, Population Health [phone]

## 2024-12-06 NOTE — Patient Instructions (Signed)
 Visit Information  Thank you for taking time to visit with me today. Please don't hesitate to contact me if I can be of assistance to you before our next scheduled appointment.  Your next care management appointment is by telephone on 12/18/24 at 11am    Please call the care guide team at [phone] if you need to cancel, schedule, or reschedule an appointment.   Please call the Suicide and Crisis Lifeline: 988 call the [place]  National Suicide Prevention Lifeline: 504-640-9480 or TTY: 601-860-5862 TTY (867)875-7376) to talk to a trained counselor call 1-800-273-TALK (toll free, 24 hour hotline) call 911 if you are experiencing a Mental Health or Behavioral Health Crisis or need someone to talk to. Thersia Hoar, HEDWIG, MHA [place]  [facility] Social Worker, Population Health [phone]

## 2024-12-12 ENCOUNTER — Telehealth: Payer: Self-pay | Admitting: Nurse Practitioner

## 2024-12-12 NOTE — Telephone Encounter (Signed)
 Called and left message informing patient.

## 2024-12-12 NOTE — Telephone Encounter (Unsigned)
 Copied from CRM #[id number]. Topic: Clinical - Medical Advice >> Dec 12, 2024  8:54 AM Darshell M wrote: Reason for CRM: Patient would like to know if she can take calcium  and magnesium at the same time. Patient CB [phone] or [phone]

## 2024-12-18 ENCOUNTER — Telehealth: Payer: PRIVATE HEALTH INSURANCE

## 2024-12-19 ENCOUNTER — Telehealth: Payer: PRIVATE HEALTH INSURANCE

## 2025-01-20 ENCOUNTER — Ambulatory Visit: Payer: PRIVATE HEALTH INSURANCE | Admitting: Nurse Practitioner

## 2025-11-27 ENCOUNTER — Ambulatory Visit: Payer: PRIVATE HEALTH INSURANCE
